# Patient Record
Sex: Female | Born: 1950
Health system: Southern US, Community
[De-identification: ages and names within clinical notes are randomized; demographics above are authoritative.]

## PROBLEM LIST (undated history)

## (undated) DIAGNOSIS — D594 Other nonautoimmune hemolytic anemias: Secondary | ICD-10-CM

## (undated) DIAGNOSIS — M4800 Spinal stenosis, site unspecified: Secondary | ICD-10-CM

## (undated) DIAGNOSIS — E039 Hypothyroidism, unspecified: Secondary | ICD-10-CM

## (undated) DIAGNOSIS — I10 Essential (primary) hypertension: Secondary | ICD-10-CM

## (undated) DIAGNOSIS — K219 Gastro-esophageal reflux disease without esophagitis: Secondary | ICD-10-CM

## (undated) DIAGNOSIS — C50919 Malignant neoplasm of unspecified site of unspecified female breast: Secondary | ICD-10-CM

## (undated) DIAGNOSIS — N938 Other specified abnormal uterine and vaginal bleeding: Secondary | ICD-10-CM

## (undated) DIAGNOSIS — Z923 Personal history of irradiation: Secondary | ICD-10-CM

## (undated) DIAGNOSIS — R0989 Other specified symptoms and signs involving the circulatory and respiratory systems: Secondary | ICD-10-CM

## (undated) DIAGNOSIS — Z9221 Personal history of antineoplastic chemotherapy: Secondary | ICD-10-CM

## (undated) DIAGNOSIS — E785 Hyperlipidemia, unspecified: Secondary | ICD-10-CM

## (undated) DIAGNOSIS — C4491 Basal cell carcinoma of skin, unspecified: Secondary | ICD-10-CM

## (undated) HISTORY — DX: Basal cell carcinoma of skin, unspecified: C44.91

## (undated) HISTORY — PX: BREAST LUMPECTOMY: SHX2

## (undated) HISTORY — PX: BREAST BIOPSY: SHX20

## (undated) HISTORY — PX: OTHER SURGICAL HISTORY: SHX169

## (undated) HISTORY — PX: FOOT SURGERY: SHX648

## (undated) HISTORY — DX: Other specified abnormal uterine and vaginal bleeding: N93.8

## (undated) HISTORY — DX: Essential (primary) hypertension: I10

## (undated) HISTORY — PX: SHOULDER ARTHROSCOPY: SHX128

## (undated) HISTORY — DX: Hypothyroidism, unspecified: E03.9

## (undated) HISTORY — DX: Hyperlipidemia, unspecified: E78.5

## (undated) HISTORY — DX: Spinal stenosis, site unspecified: M48.00

## (undated) HISTORY — DX: Malignant neoplasm of unspecified site of unspecified female breast: C50.919

## (undated) HISTORY — DX: Other nonautoimmune hemolytic anemias: D59.4

## (undated) HISTORY — PX: CERVICAL LAMINECTOMY: SHX94

## (undated) HISTORY — DX: Other specified symptoms and signs involving the circulatory and respiratory systems: R09.89

## (undated) HISTORY — PX: MASTECTOMY W/ NODES PARTIAL: SUR854

---

## 1979-07-27 HISTORY — PX: TUBAL LIGATION: SHX77

## 1998-01-21 ENCOUNTER — Ambulatory Visit (HOSPITAL_BASED_OUTPATIENT_CLINIC_OR_DEPARTMENT_OTHER): Admission: RE | Admit: 1998-01-21 | Discharge: 1998-01-21 | Payer: Self-pay | Admitting: Orthopedic Surgery

## 1998-06-18 ENCOUNTER — Emergency Department (HOSPITAL_COMMUNITY): Admission: EM | Admit: 1998-06-18 | Discharge: 1998-06-18 | Payer: Self-pay | Admitting: Emergency Medicine

## 1998-07-10 ENCOUNTER — Emergency Department (HOSPITAL_COMMUNITY): Admission: EM | Admit: 1998-07-10 | Discharge: 1998-07-10 | Payer: Self-pay | Admitting: Emergency Medicine

## 1998-10-01 ENCOUNTER — Other Ambulatory Visit: Admission: RE | Admit: 1998-10-01 | Discharge: 1998-10-01 | Payer: Self-pay | Admitting: Family Medicine

## 1999-09-18 ENCOUNTER — Encounter: Payer: Self-pay | Admitting: Family Medicine

## 1999-09-18 ENCOUNTER — Encounter: Admission: RE | Admit: 1999-09-18 | Discharge: 1999-09-18 | Payer: Self-pay | Admitting: Family Medicine

## 1999-10-12 ENCOUNTER — Other Ambulatory Visit: Admission: RE | Admit: 1999-10-12 | Discharge: 1999-10-12 | Payer: Self-pay | Admitting: Family Medicine

## 1999-11-18 ENCOUNTER — Encounter: Payer: Self-pay | Admitting: Emergency Medicine

## 1999-11-18 ENCOUNTER — Emergency Department (HOSPITAL_COMMUNITY): Admission: EM | Admit: 1999-11-18 | Discharge: 1999-11-18 | Payer: Self-pay | Admitting: Emergency Medicine

## 1999-12-26 ENCOUNTER — Encounter: Payer: Self-pay | Admitting: Gastroenterology

## 1999-12-26 ENCOUNTER — Ambulatory Visit (HOSPITAL_COMMUNITY): Admission: RE | Admit: 1999-12-26 | Discharge: 1999-12-26 | Payer: Self-pay | Admitting: Gastroenterology

## 2000-01-05 ENCOUNTER — Ambulatory Visit (HOSPITAL_COMMUNITY): Admission: RE | Admit: 2000-01-05 | Discharge: 2000-01-05 | Payer: Self-pay | Admitting: Gastroenterology

## 2000-01-05 ENCOUNTER — Encounter: Payer: Self-pay | Admitting: Gastroenterology

## 2000-01-20 ENCOUNTER — Other Ambulatory Visit: Admission: RE | Admit: 2000-01-20 | Discharge: 2000-01-20 | Payer: Self-pay | Admitting: Obstetrics and Gynecology

## 2000-03-01 ENCOUNTER — Other Ambulatory Visit: Admission: RE | Admit: 2000-03-01 | Discharge: 2000-03-01 | Payer: Self-pay | Admitting: Obstetrics and Gynecology

## 2000-03-01 ENCOUNTER — Encounter (INDEPENDENT_AMBULATORY_CARE_PROVIDER_SITE_OTHER): Payer: Self-pay | Admitting: Specialist

## 2000-03-21 ENCOUNTER — Encounter: Payer: Self-pay | Admitting: Gastroenterology

## 2000-03-21 ENCOUNTER — Ambulatory Visit (HOSPITAL_COMMUNITY): Admission: RE | Admit: 2000-03-21 | Discharge: 2000-03-21 | Payer: Self-pay | Admitting: Gastroenterology

## 2000-08-07 ENCOUNTER — Emergency Department (HOSPITAL_COMMUNITY): Admission: EM | Admit: 2000-08-07 | Discharge: 2000-08-07 | Payer: Self-pay | Admitting: Emergency Medicine

## 2000-09-19 ENCOUNTER — Encounter: Payer: Self-pay | Admitting: Family Medicine

## 2000-09-19 ENCOUNTER — Encounter: Admission: RE | Admit: 2000-09-19 | Discharge: 2000-09-19 | Payer: Self-pay | Admitting: Family Medicine

## 2001-02-09 ENCOUNTER — Emergency Department (HOSPITAL_COMMUNITY): Admission: EM | Admit: 2001-02-09 | Discharge: 2001-02-09 | Payer: Self-pay | Admitting: Emergency Medicine

## 2001-02-09 ENCOUNTER — Encounter: Payer: Self-pay | Admitting: Emergency Medicine

## 2001-04-17 ENCOUNTER — Other Ambulatory Visit: Admission: RE | Admit: 2001-04-17 | Discharge: 2001-04-17 | Payer: Self-pay | Admitting: Obstetrics and Gynecology

## 2001-09-21 ENCOUNTER — Encounter: Payer: Self-pay | Admitting: Obstetrics and Gynecology

## 2001-09-21 ENCOUNTER — Encounter: Admission: RE | Admit: 2001-09-21 | Discharge: 2001-09-21 | Payer: Self-pay | Admitting: Obstetrics and Gynecology

## 2001-09-27 ENCOUNTER — Encounter: Admission: RE | Admit: 2001-09-27 | Discharge: 2001-09-27 | Payer: Self-pay | Admitting: Obstetrics and Gynecology

## 2001-09-27 ENCOUNTER — Encounter: Payer: Self-pay | Admitting: Obstetrics and Gynecology

## 2001-10-09 ENCOUNTER — Encounter: Payer: Self-pay | Admitting: General Surgery

## 2001-10-09 ENCOUNTER — Encounter: Admission: RE | Admit: 2001-10-09 | Discharge: 2001-10-09 | Payer: Self-pay | Admitting: General Surgery

## 2001-10-12 ENCOUNTER — Encounter (INDEPENDENT_AMBULATORY_CARE_PROVIDER_SITE_OTHER): Payer: Self-pay | Admitting: Specialist

## 2001-10-12 ENCOUNTER — Ambulatory Visit (HOSPITAL_BASED_OUTPATIENT_CLINIC_OR_DEPARTMENT_OTHER): Admission: RE | Admit: 2001-10-12 | Discharge: 2001-10-12 | Payer: Self-pay | Admitting: General Surgery

## 2001-10-12 ENCOUNTER — Encounter: Admission: RE | Admit: 2001-10-12 | Discharge: 2001-10-12 | Payer: Self-pay | Admitting: General Surgery

## 2001-10-12 ENCOUNTER — Encounter: Payer: Self-pay | Admitting: General Surgery

## 2001-10-26 ENCOUNTER — Ambulatory Visit: Admission: RE | Admit: 2001-10-26 | Discharge: 2002-01-24 | Payer: Self-pay | Admitting: Radiation Oncology

## 2001-11-07 ENCOUNTER — Encounter: Payer: Self-pay | Admitting: Oncology

## 2001-11-07 ENCOUNTER — Ambulatory Visit (HOSPITAL_COMMUNITY): Admission: RE | Admit: 2001-11-07 | Discharge: 2001-11-07 | Payer: Self-pay | Admitting: Oncology

## 2002-02-22 ENCOUNTER — Ambulatory Visit: Admission: RE | Admit: 2002-02-22 | Discharge: 2002-04-30 | Payer: Self-pay | Admitting: Radiation Oncology

## 2002-02-28 ENCOUNTER — Encounter: Admission: RE | Admit: 2002-02-28 | Discharge: 2002-02-28 | Payer: Self-pay | Admitting: General Surgery

## 2002-02-28 ENCOUNTER — Encounter: Payer: Self-pay | Admitting: General Surgery

## 2002-04-25 ENCOUNTER — Other Ambulatory Visit: Admission: RE | Admit: 2002-04-25 | Discharge: 2002-04-25 | Payer: Self-pay | Admitting: Obstetrics and Gynecology

## 2002-07-02 ENCOUNTER — Encounter: Payer: Self-pay | Admitting: Oncology

## 2002-07-02 ENCOUNTER — Ambulatory Visit (HOSPITAL_COMMUNITY): Admission: RE | Admit: 2002-07-02 | Discharge: 2002-07-02 | Payer: Self-pay | Admitting: Oncology

## 2002-07-05 ENCOUNTER — Encounter: Payer: Self-pay | Admitting: Emergency Medicine

## 2002-07-05 ENCOUNTER — Emergency Department (HOSPITAL_COMMUNITY): Admission: EM | Admit: 2002-07-05 | Discharge: 2002-07-05 | Payer: Self-pay | Admitting: Emergency Medicine

## 2002-08-10 ENCOUNTER — Encounter: Payer: Self-pay | Admitting: Oncology

## 2002-08-10 ENCOUNTER — Ambulatory Visit (HOSPITAL_COMMUNITY): Admission: RE | Admit: 2002-08-10 | Discharge: 2002-08-10 | Payer: Self-pay | Admitting: Oncology

## 2002-08-28 ENCOUNTER — Encounter: Admission: RE | Admit: 2002-08-28 | Discharge: 2002-08-28 | Payer: Self-pay | Admitting: Gastroenterology

## 2002-08-28 ENCOUNTER — Encounter: Payer: Self-pay | Admitting: Gastroenterology

## 2002-09-14 ENCOUNTER — Ambulatory Visit (HOSPITAL_COMMUNITY): Admission: RE | Admit: 2002-09-14 | Discharge: 2002-09-14 | Payer: Self-pay | Admitting: Gastroenterology

## 2002-09-24 ENCOUNTER — Encounter: Payer: Self-pay | Admitting: Obstetrics and Gynecology

## 2002-09-24 ENCOUNTER — Encounter: Admission: RE | Admit: 2002-09-24 | Discharge: 2002-09-24 | Payer: Self-pay | Admitting: Obstetrics and Gynecology

## 2003-02-14 ENCOUNTER — Encounter: Payer: Self-pay | Admitting: Oncology

## 2003-02-14 ENCOUNTER — Encounter: Admission: RE | Admit: 2003-02-14 | Discharge: 2003-02-14 | Payer: Self-pay | Admitting: Oncology

## 2003-05-17 ENCOUNTER — Other Ambulatory Visit: Admission: RE | Admit: 2003-05-17 | Discharge: 2003-05-17 | Payer: Self-pay | Admitting: Obstetrics and Gynecology

## 2003-09-25 ENCOUNTER — Encounter: Admission: RE | Admit: 2003-09-25 | Discharge: 2003-09-25 | Payer: Self-pay | Admitting: Obstetrics and Gynecology

## 2004-05-28 ENCOUNTER — Other Ambulatory Visit: Admission: RE | Admit: 2004-05-28 | Discharge: 2004-05-28 | Payer: Self-pay | Admitting: Obstetrics and Gynecology

## 2004-06-25 ENCOUNTER — Ambulatory Visit: Payer: Self-pay | Admitting: Oncology

## 2004-08-24 ENCOUNTER — Ambulatory Visit: Payer: Self-pay | Admitting: Family Medicine

## 2004-09-01 ENCOUNTER — Ambulatory Visit: Payer: Self-pay | Admitting: Family Medicine

## 2004-09-25 ENCOUNTER — Encounter: Admission: RE | Admit: 2004-09-25 | Discharge: 2004-09-25 | Payer: Self-pay | Admitting: Oncology

## 2004-11-10 ENCOUNTER — Ambulatory Visit: Payer: Self-pay | Admitting: Family Medicine

## 2004-11-13 ENCOUNTER — Ambulatory Visit: Payer: Self-pay | Admitting: Family Medicine

## 2005-01-05 ENCOUNTER — Ambulatory Visit: Payer: Self-pay | Admitting: Oncology

## 2005-02-15 ENCOUNTER — Encounter: Admission: RE | Admit: 2005-02-15 | Discharge: 2005-02-15 | Payer: Self-pay | Admitting: Oncology

## 2005-06-22 ENCOUNTER — Ambulatory Visit: Payer: Self-pay | Admitting: Oncology

## 2005-07-05 ENCOUNTER — Other Ambulatory Visit: Admission: RE | Admit: 2005-07-05 | Discharge: 2005-07-05 | Payer: Self-pay | Admitting: Obstetrics and Gynecology

## 2005-07-26 DIAGNOSIS — C4491 Basal cell carcinoma of skin, unspecified: Secondary | ICD-10-CM

## 2005-07-26 HISTORY — DX: Basal cell carcinoma of skin, unspecified: C44.91

## 2005-09-06 ENCOUNTER — Ambulatory Visit: Payer: Self-pay | Admitting: Family Medicine

## 2005-09-27 ENCOUNTER — Encounter: Admission: RE | Admit: 2005-09-27 | Discharge: 2005-09-27 | Payer: Self-pay | Admitting: Oncology

## 2005-11-02 ENCOUNTER — Ambulatory Visit: Payer: Self-pay | Admitting: Family Medicine

## 2005-11-28 ENCOUNTER — Encounter: Admission: RE | Admit: 2005-11-28 | Discharge: 2005-11-28 | Payer: Self-pay | Admitting: General Surgery

## 2005-11-30 ENCOUNTER — Encounter: Admission: RE | Admit: 2005-11-30 | Discharge: 2005-11-30 | Payer: Self-pay | Admitting: Oncology

## 2005-12-09 ENCOUNTER — Encounter: Admission: RE | Admit: 2005-12-09 | Discharge: 2005-12-09 | Payer: Self-pay | Admitting: Oncology

## 2005-12-09 ENCOUNTER — Encounter (INDEPENDENT_AMBULATORY_CARE_PROVIDER_SITE_OTHER): Payer: Self-pay | Admitting: *Deleted

## 2005-12-17 ENCOUNTER — Ambulatory Visit: Payer: Self-pay | Admitting: Oncology

## 2005-12-22 LAB — COMPREHENSIVE METABOLIC PANEL
ALT: 13 U/L (ref 0–40)
Albumin: 4.4 g/dL (ref 3.5–5.2)
CO2: 28 mEq/L (ref 19–32)
Calcium: 9.5 mg/dL (ref 8.4–10.5)
Chloride: 104 mEq/L (ref 96–112)
Glucose, Bld: 94 mg/dL (ref 70–99)
Sodium: 142 mEq/L (ref 135–145)
Total Protein: 7.5 g/dL (ref 6.0–8.3)

## 2005-12-22 LAB — CBC WITH DIFFERENTIAL/PLATELET
Basophils Absolute: 0 10*3/uL (ref 0.0–0.1)
Eosinophils Absolute: 0.2 10*3/uL (ref 0.0–0.5)
HGB: 12.6 g/dL (ref 11.6–15.9)
MCV: 92 fL (ref 81.0–101.0)
MONO#: 0.3 10*3/uL (ref 0.1–0.9)
MONO%: 5.7 % (ref 0.0–13.0)
NEUT#: 2.9 10*3/uL (ref 1.5–6.5)
Platelets: 284 10*3/uL (ref 145–400)
RBC: 4.07 10*6/uL (ref 3.70–5.32)
RDW: 13.4 % (ref 11.3–14.5)
WBC: 4.9 10*3/uL (ref 3.9–10.0)

## 2005-12-22 LAB — CANCER ANTIGEN 27.29: CA 27.29: 27 U/mL (ref 0–39)

## 2005-12-22 LAB — LACTATE DEHYDROGENASE: LDH: 117 U/L (ref 94–250)

## 2006-06-01 ENCOUNTER — Encounter: Admission: RE | Admit: 2006-06-01 | Discharge: 2006-06-01 | Payer: Self-pay | Admitting: Oncology

## 2006-06-15 ENCOUNTER — Encounter: Admission: RE | Admit: 2006-06-15 | Discharge: 2006-06-15 | Payer: Self-pay | Admitting: Oncology

## 2006-06-20 ENCOUNTER — Ambulatory Visit: Payer: Self-pay | Admitting: Oncology

## 2006-06-22 LAB — CBC WITH DIFFERENTIAL/PLATELET
Basophils Absolute: 0 10*3/uL (ref 0.0–0.1)
Eosinophils Absolute: 0.2 10*3/uL (ref 0.0–0.5)
HGB: 12.8 g/dL (ref 11.6–15.9)
MCV: 90.3 fL (ref 81.0–101.0)
MONO#: 0.3 10*3/uL (ref 0.1–0.9)
NEUT#: 2.3 10*3/uL (ref 1.5–6.5)
RDW: 13.8 % (ref 11.3–14.5)
lymph#: 1.6 10*3/uL (ref 0.9–3.3)

## 2006-06-22 LAB — COMPREHENSIVE METABOLIC PANEL
Albumin: 4.2 g/dL (ref 3.5–5.2)
CO2: 27 mEq/L (ref 19–32)
Chloride: 107 mEq/L (ref 96–112)
Glucose, Bld: 107 mg/dL — ABNORMAL HIGH (ref 70–99)
Potassium: 3.8 mEq/L (ref 3.5–5.3)
Sodium: 145 mEq/L (ref 135–145)
Total Protein: 7.1 g/dL (ref 6.0–8.3)

## 2006-06-22 LAB — LACTATE DEHYDROGENASE: LDH: 115 U/L (ref 94–250)

## 2006-07-20 ENCOUNTER — Encounter: Admission: RE | Admit: 2006-07-20 | Discharge: 2006-07-20 | Payer: Self-pay | Admitting: General Surgery

## 2006-07-20 ENCOUNTER — Ambulatory Visit (HOSPITAL_BASED_OUTPATIENT_CLINIC_OR_DEPARTMENT_OTHER): Admission: RE | Admit: 2006-07-20 | Discharge: 2006-07-20 | Payer: Self-pay | Admitting: General Surgery

## 2006-07-20 ENCOUNTER — Encounter (INDEPENDENT_AMBULATORY_CARE_PROVIDER_SITE_OTHER): Payer: Self-pay | Admitting: Specialist

## 2006-07-26 ENCOUNTER — Encounter: Payer: Self-pay | Admitting: Family Medicine

## 2006-07-26 LAB — HM MAMMOGRAPHY

## 2006-09-02 ENCOUNTER — Ambulatory Visit: Payer: Self-pay | Admitting: Family Medicine

## 2006-09-02 LAB — CONVERTED CEMR LAB
Basophils Absolute: 0 10*3/uL (ref 0.0–0.1)
Basophils Relative: 0.5 % (ref 0.0–1.0)
Calcium: 9.8 mg/dL (ref 8.4–10.5)
Chloride: 106 meq/L (ref 96–112)
Cholesterol: 205 mg/dL (ref 0–200)
Creatinine, Ser: 0.4 mg/dL (ref 0.4–1.2)
Direct LDL: 140.4 mg/dL
Eosinophils Absolute: 0.2 10*3/uL (ref 0.0–0.6)
GFR calc non Af Amer: 176 mL/min
Glucose, Bld: 96 mg/dL (ref 70–99)
HCT: 38.5 % (ref 36.0–46.0)
HDL: 54.3 mg/dL (ref >39.0)
Hemoglobin: 13.1 g/dL (ref 12.0–15.0)
MCHC: 34.1 g/dL (ref 30.0–36.0)
MCV: 88.8 fL (ref 78.0–100.0)
Monocytes Relative: 7.7 % (ref 3.0–11.0)
Neutrophils Relative %: 54.4 % (ref 43.0–77.0)
RBC: 4.33 M/uL (ref 3.87–5.11)
RDW: 12.9 % (ref 11.5–14.6)
Sodium: 148 meq/L — ABNORMAL HIGH (ref 135–145)
WBC: 3.8 10*3/uL — ABNORMAL LOW (ref 4.5–10.5)

## 2006-09-09 ENCOUNTER — Ambulatory Visit: Payer: Self-pay | Admitting: Family Medicine

## 2006-09-29 ENCOUNTER — Encounter: Admission: RE | Admit: 2006-09-29 | Discharge: 2006-09-29 | Payer: Self-pay | Admitting: Obstetrics and Gynecology

## 2007-01-09 ENCOUNTER — Ambulatory Visit: Payer: Self-pay | Admitting: Family Medicine

## 2007-01-09 LAB — CONVERTED CEMR LAB: TSH: 0.29 u[IU]/mL — ABNORMAL LOW

## 2007-01-26 ENCOUNTER — Encounter: Payer: Self-pay | Admitting: Family Medicine

## 2007-01-26 DIAGNOSIS — E039 Hypothyroidism, unspecified: Secondary | ICD-10-CM

## 2007-02-16 ENCOUNTER — Encounter: Admission: RE | Admit: 2007-02-16 | Discharge: 2007-02-16 | Payer: Self-pay | Admitting: Oncology

## 2007-02-27 ENCOUNTER — Ambulatory Visit: Payer: Self-pay | Admitting: Oncology

## 2007-03-01 LAB — CBC WITH DIFFERENTIAL/PLATELET
BASO%: 0.4 % (ref 0.0–2.0)
EOS%: 3.3 % (ref 0.0–7.0)
MCH: 31.5 pg (ref 26.0–34.0)
MCHC: 35.4 g/dL (ref 32.0–36.0)
MONO#: 0.4 10*3/uL (ref 0.1–0.9)
NEUT%: 61 % (ref 39.6–76.8)
RBC: 4.44 10*6/uL (ref 3.70–5.32)
RDW: 13.6 % (ref 11.3–14.5)
WBC: 6.7 10*3/uL (ref 3.9–10.0)
lymph#: 1.9 10*3/uL (ref 0.9–3.3)

## 2007-03-01 LAB — COMPREHENSIVE METABOLIC PANEL
Alkaline Phosphatase: 98 U/L (ref 39–117)
CO2: 26 mEq/L (ref 19–32)
Creatinine, Ser: 0.62 mg/dL (ref 0.40–1.20)
Glucose, Bld: 124 mg/dL — ABNORMAL HIGH (ref 70–99)
Sodium: 144 mEq/L (ref 135–145)
Total Bilirubin: 0.4 mg/dL (ref 0.3–1.2)
Total Protein: 7.2 g/dL (ref 6.0–8.3)

## 2007-03-01 LAB — LACTATE DEHYDROGENASE: LDH: 133 U/L (ref 94–250)

## 2007-03-01 LAB — CANCER ANTIGEN 27.29: CA 27.29: 26 U/mL (ref 0–39)

## 2007-04-04 DIAGNOSIS — M549 Dorsalgia, unspecified: Secondary | ICD-10-CM | POA: Insufficient documentation

## 2007-04-10 ENCOUNTER — Ambulatory Visit: Payer: Self-pay | Admitting: Family Medicine

## 2007-04-11 ENCOUNTER — Encounter: Payer: Self-pay | Admitting: Family Medicine

## 2007-04-13 ENCOUNTER — Ambulatory Visit: Payer: Self-pay | Admitting: Family Medicine

## 2007-04-18 ENCOUNTER — Telehealth: Payer: Self-pay | Admitting: Family Medicine

## 2007-04-18 ENCOUNTER — Ambulatory Visit (HOSPITAL_COMMUNITY): Admission: RE | Admit: 2007-04-18 | Discharge: 2007-04-18 | Payer: Self-pay | Admitting: Family Medicine

## 2007-08-10 ENCOUNTER — Ambulatory Visit: Payer: Self-pay | Admitting: Family Medicine

## 2007-08-10 DIAGNOSIS — M6580 Other synovitis and tenosynovitis, unspecified site: Secondary | ICD-10-CM

## 2007-08-30 ENCOUNTER — Ambulatory Visit: Payer: Self-pay | Admitting: Oncology

## 2007-09-04 ENCOUNTER — Ambulatory Visit: Payer: Self-pay | Admitting: Family Medicine

## 2007-09-04 LAB — CONVERTED CEMR LAB
ALT: 16 units/L (ref 0–35)
BUN: 10 mg/dL (ref 6–23)
Basophils Absolute: 0 10*3/uL (ref 0.0–0.1)
Basophils Relative: 0.3 % (ref 0.0–1.0)
Bilirubin, Direct: 0.1 mg/dL (ref 0.0–0.3)
Blood in Urine, dipstick: NEGATIVE
Cholesterol: 184 mg/dL (ref 0–200)
Creatinine, Ser: 0.7 mg/dL (ref 0.4–1.2)
Eosinophils Absolute: 0.2 10*3/uL (ref 0.0–0.6)
Eosinophils Relative: 2.7 % (ref 0.0–5.0)
GFR calc non Af Amer: 92 mL/min
Glucose, Bld: 102 mg/dL — ABNORMAL HIGH (ref 70–99)
HDL: 51.1 mg/dL (ref >39.0)
Hemoglobin: 13.3 g/dL (ref 12.0–15.0)
Lymphocytes Relative: 21 % (ref 12.0–46.0)
MCV: 91.9 fL (ref 78.0–100.0)
Monocytes Absolute: 0.3 10*3/uL (ref 0.2–0.7)
Neutro Abs: 4.7 10*3/uL (ref 1.4–7.7)
Neutrophils Relative %: 71.7 % (ref 43.0–77.0)
Protein, U semiquant: NEGATIVE
RBC: 4.31 M/uL (ref 3.87–5.11)
Specific Gravity, Urine: 1.02
Total Bilirubin: 0.5 mg/dL (ref 0.3–1.2)
Triglycerides: 60 mg/dL (ref 0–149)
WBC Urine, dipstick: NEGATIVE
pH: 7.5

## 2007-09-05 DIAGNOSIS — R1031 Right lower quadrant pain: Secondary | ICD-10-CM

## 2007-09-11 ENCOUNTER — Encounter: Payer: Self-pay | Admitting: Internal Medicine

## 2007-09-11 ENCOUNTER — Ambulatory Visit: Payer: Self-pay | Admitting: Family Medicine

## 2007-09-11 DIAGNOSIS — K210 Gastro-esophageal reflux disease with esophagitis: Secondary | ICD-10-CM

## 2007-09-11 DIAGNOSIS — E785 Hyperlipidemia, unspecified: Secondary | ICD-10-CM | POA: Insufficient documentation

## 2007-09-11 DIAGNOSIS — G43909 Migraine, unspecified, not intractable, without status migrainosus: Secondary | ICD-10-CM | POA: Insufficient documentation

## 2007-09-14 ENCOUNTER — Telehealth: Payer: Self-pay | Admitting: Family Medicine

## 2007-09-19 ENCOUNTER — Encounter: Admission: RE | Admit: 2007-09-19 | Discharge: 2007-09-19 | Payer: Self-pay | Admitting: Family Medicine

## 2007-09-20 ENCOUNTER — Telehealth: Payer: Self-pay | Admitting: Family Medicine

## 2007-10-02 ENCOUNTER — Encounter: Admission: RE | Admit: 2007-10-02 | Discharge: 2007-10-02 | Payer: Self-pay | Admitting: Oncology

## 2007-10-23 ENCOUNTER — Ambulatory Visit: Payer: Self-pay | Admitting: Oncology

## 2007-10-25 LAB — CBC WITH DIFFERENTIAL/PLATELET
BASO%: 0.2 % (ref 0.0–2.0)
EOS%: 4.3 % (ref 0.0–7.0)
MCH: 31 pg (ref 26.0–34.0)
MCV: 89.9 fL (ref 81.0–101.0)
MONO%: 2.7 % (ref 0.0–13.0)
RBC: 4.35 10*6/uL (ref 3.70–5.32)
RDW: 13.7 % (ref 11.3–14.5)

## 2007-10-26 LAB — VITAMIN D 25 HYDROXY (VIT D DEFICIENCY, FRACTURES): Vit D, 25-Hydroxy: 24 ng/mL — ABNORMAL LOW (ref 30–89)

## 2007-10-26 LAB — COMPREHENSIVE METABOLIC PANEL
AST: 14 U/L (ref 0–37)
Albumin: 4.9 g/dL (ref 3.5–5.2)
Alkaline Phosphatase: 76 U/L (ref 39–117)
Potassium: 3.7 mEq/L (ref 3.5–5.3)
Sodium: 140 mEq/L (ref 135–145)
Total Protein: 7.9 g/dL (ref 6.0–8.3)

## 2007-10-31 LAB — VITAMIN D 1,25 DIHYDROXY: Vit D, 1,25-Dihydroxy: 50 pg/mL (ref 15–75)

## 2008-07-12 ENCOUNTER — Ambulatory Visit: Payer: Self-pay | Admitting: Family Medicine

## 2008-07-12 DIAGNOSIS — N390 Urinary tract infection, site not specified: Secondary | ICD-10-CM | POA: Insufficient documentation

## 2008-07-12 LAB — CONVERTED CEMR LAB
Glucose, Urine, Semiquant: NEGATIVE
Ketones, urine, test strip: NEGATIVE
Protein, U semiquant: NEGATIVE
Specific Gravity, Urine: >=1.03
Urobilinogen, UA: 0.2
pH: 7.5

## 2008-08-26 DIAGNOSIS — R0989 Other specified symptoms and signs involving the circulatory and respiratory systems: Secondary | ICD-10-CM

## 2008-08-26 HISTORY — DX: Other specified symptoms and signs involving the circulatory and respiratory systems: R09.89

## 2008-09-03 ENCOUNTER — Ambulatory Visit: Payer: Self-pay | Admitting: Family Medicine

## 2008-09-03 LAB — CONVERTED CEMR LAB
ALT: 19 units/L (ref 0–35)
AST: 17 units/L (ref 0–37)
Albumin: 4.3 g/dL (ref 3.5–5.2)
Alkaline Phosphatase: 46 units/L (ref 39–117)
Basophils Relative: 0.2 % (ref 0.0–3.0)
CO2: 31 meq/L (ref 19–32)
Calcium: 9.6 mg/dL (ref 8.4–10.5)
Chloride: 104 meq/L (ref 96–112)
Cholesterol: 186 mg/dL (ref 0–200)
Creatinine, Ser: 0.5 mg/dL (ref 0.4–1.2)
Eosinophils Relative: 0.5 % (ref 0.0–5.0)
GFR calc Af Amer: 164 mL/min
GFR calc non Af Amer: 135 mL/min
HDL: 71.2 mg/dL (ref >39.0)
Ketones, urine, test strip: NEGATIVE
Lymphocytes Relative: 32 % (ref 12.0–46.0)
Monocytes Absolute: 0.4 10*3/uL (ref 0.1–1.0)
Neutro Abs: 4.4 10*3/uL (ref 1.4–7.7)
Nitrite: NEGATIVE
Platelets: 255 10*3/uL (ref 150–400)
Potassium: 4.1 meq/L (ref 3.5–5.1)
RDW: 13 % (ref 11.5–14.6)
Specific Gravity, Urine: 1.02
TSH: 0.96 microintl units/mL (ref 0.35–5.50)
Urobilinogen, UA: 0.2
VLDL: 12 mg/dL (ref 0–40)
WBC Urine, dipstick: NEGATIVE

## 2008-09-10 ENCOUNTER — Ambulatory Visit: Payer: Self-pay | Admitting: Family Medicine

## 2008-09-10 DIAGNOSIS — R0989 Other specified symptoms and signs involving the circulatory and respiratory systems: Secondary | ICD-10-CM

## 2008-09-16 ENCOUNTER — Encounter: Payer: Self-pay | Admitting: Family Medicine

## 2008-09-16 ENCOUNTER — Ambulatory Visit: Payer: Self-pay

## 2008-09-24 ENCOUNTER — Ambulatory Visit: Payer: Self-pay | Admitting: Family Medicine

## 2008-10-02 ENCOUNTER — Encounter: Admission: RE | Admit: 2008-10-02 | Discharge: 2008-10-02 | Payer: Self-pay | Admitting: Oncology

## 2008-10-06 ENCOUNTER — Encounter: Admission: RE | Admit: 2008-10-06 | Discharge: 2008-10-06 | Payer: Self-pay | Admitting: Oncology

## 2008-11-14 ENCOUNTER — Ambulatory Visit: Payer: Self-pay | Admitting: Oncology

## 2008-11-18 LAB — COMPREHENSIVE METABOLIC PANEL
ALT: 21 U/L (ref 0–35)
AST: 20 U/L (ref 0–37)
CO2: 31 mEq/L (ref 19–32)
Creatinine, Ser: 0.52 mg/dL (ref 0.40–1.20)
Total Bilirubin: 0.6 mg/dL (ref 0.3–1.2)

## 2008-11-18 LAB — LACTATE DEHYDROGENASE: LDH: 120 U/L (ref 94–250)

## 2008-11-18 LAB — CBC WITH DIFFERENTIAL/PLATELET
BASO%: 0.3 % (ref 0.0–2.0)
EOS%: 2.6 % (ref 0.0–7.0)
HCT: 38.7 % (ref 34.8–46.6)
LYMPH%: 25.9 % (ref 14.0–49.7)
MCH: 31.5 pg (ref 25.1–34.0)
MCHC: 34.2 g/dL (ref 31.5–36.0)
MCV: 92.2 fL (ref 79.5–101.0)
NEUT%: 67.2 % (ref 38.4–76.8)
Platelets: 253 10*3/uL (ref 145–400)

## 2009-02-17 ENCOUNTER — Encounter: Admission: RE | Admit: 2009-02-17 | Discharge: 2009-02-17 | Payer: Self-pay | Admitting: Oncology

## 2009-04-01 ENCOUNTER — Ambulatory Visit: Payer: Self-pay | Admitting: Family Medicine

## 2009-04-01 ENCOUNTER — Encounter: Payer: Self-pay | Admitting: *Deleted

## 2009-04-01 DIAGNOSIS — J039 Acute tonsillitis, unspecified: Secondary | ICD-10-CM

## 2009-04-09 ENCOUNTER — Telehealth (INDEPENDENT_AMBULATORY_CARE_PROVIDER_SITE_OTHER): Payer: Self-pay | Admitting: *Deleted

## 2009-04-26 ENCOUNTER — Ambulatory Visit: Payer: Self-pay | Admitting: Internal Medicine

## 2009-04-26 DIAGNOSIS — N3000 Acute cystitis without hematuria: Secondary | ICD-10-CM

## 2009-04-26 LAB — CONVERTED CEMR LAB
Bilirubin Urine: NEGATIVE
Nitrite: NEGATIVE
Protein, U semiquant: NEGATIVE
Urobilinogen, UA: 0.2

## 2009-09-05 ENCOUNTER — Ambulatory Visit: Payer: Self-pay | Admitting: Family Medicine

## 2009-09-05 LAB — CONVERTED CEMR LAB
Albumin: 4 g/dL (ref 3.5–5.2)
BUN: 16 mg/dL (ref 6–23)
Basophils Absolute: 0 10*3/uL (ref 0.0–0.1)
CO2: 29 meq/L (ref 19–32)
Calcium: 9.2 mg/dL (ref 8.4–10.5)
Cholesterol: 177 mg/dL (ref 0–200)
Eosinophils Absolute: 0.1 10*3/uL (ref 0.0–0.7)
Glucose, Bld: 105 mg/dL — ABNORMAL HIGH (ref 70–99)
HCT: 39.3 % (ref 36.0–46.0)
HDL: 65.8 mg/dL (ref >39.00)
Hemoglobin: 13 g/dL (ref 12.0–15.0)
Lymphs Abs: 1.1 10*3/uL (ref 0.7–4.0)
MCHC: 33.1 g/dL (ref 30.0–36.0)
Neutro Abs: 6.1 10*3/uL (ref 1.4–7.7)
Platelets: 243 10*3/uL (ref 150.0–400.0)
Potassium: 4.4 meq/L (ref 3.5–5.1)
RDW: 12.8 % (ref 11.5–14.6)
Sodium: 144 meq/L (ref 135–145)
TSH: 0.51 microintl units/mL (ref 0.35–5.50)
Total Bilirubin: 0.5 mg/dL (ref 0.3–1.2)
Triglycerides: 41 mg/dL (ref 0.0–149.0)
VLDL: 8.2 mg/dL (ref 0.0–40.0)

## 2009-09-15 ENCOUNTER — Ambulatory Visit: Payer: Self-pay | Admitting: Family Medicine

## 2009-10-03 ENCOUNTER — Encounter: Admission: RE | Admit: 2009-10-03 | Discharge: 2009-10-03 | Payer: Self-pay | Admitting: Obstetrics and Gynecology

## 2009-12-26 ENCOUNTER — Emergency Department (HOSPITAL_COMMUNITY): Admission: EM | Admit: 2009-12-26 | Discharge: 2009-12-26 | Payer: Self-pay | Admitting: Emergency Medicine

## 2010-04-07 ENCOUNTER — Ambulatory Visit: Payer: Self-pay | Admitting: Family Medicine

## 2010-04-07 ENCOUNTER — Telehealth: Payer: Self-pay | Admitting: Family Medicine

## 2010-04-07 DIAGNOSIS — L259 Unspecified contact dermatitis, unspecified cause: Secondary | ICD-10-CM

## 2010-07-21 ENCOUNTER — Ambulatory Visit
Admission: RE | Admit: 2010-07-21 | Discharge: 2010-07-21 | Payer: Self-pay | Source: Home / Self Care | Attending: Internal Medicine | Admitting: Internal Medicine

## 2010-07-21 DIAGNOSIS — I10 Essential (primary) hypertension: Secondary | ICD-10-CM

## 2010-08-25 NOTE — Progress Notes (Signed)
Summary: Request to be Worked In  Phone Note Call from Patient Call back at Home Phone [phone] Call back at [phone]   Caller: Husband -Fayrene Fearing Summary of Call: Pt has itching rash around neck can only come in after 4pm today due to work schedule.  Wants to know if pt could be worked in.  Please advise Initial call taken by: Trixie Dredge,  April 07, 2010 9:13 AM  Follow-up for Phone Call        ok Follow-up by: Roderick Pee MD,  April 07, 2010 9:47 AM  Additional Follow-up for Phone Call Additional follow up Details #1::        Phone Call Completed Additional Follow-up by: Kern Reap CMA Duncan Dull),  April 07, 2010 9:55 AM

## 2010-08-25 NOTE — Assessment & Plan Note (Signed)
Summary: CPX NO PAP//SLM/RSC BMP/JLP   Vital Signs:  Patient profile:   60 year old female Height:      61.25 inches Weight:      136 pounds Temp:     99.4 degrees F oral BP sitting:   128 / 78  (left arm) Cuff size:   regular  Vitals Entered By: Kern Reap CMA Duncan Dull) (September 15, 2009 2:13 PM)  Reason for Visit cpx  History of Present Illness: Patricia Solis is a 60 year old, married female, nonsmoker, who comes in today for evaluation.  She has a history of hypothyroidism and takes Synthroid 100 micrograms daily TSH level is normal.  Continued dose.  She also takes over-the-counter Prilosec 20 mg daily for reflux.  She also takes simvastatin 40 mg nightly for hyperlipidemia.  Lipids are ago continue but does  She gets routine eye care.  Dental care does BSE monthly.......Marland Kitchen breast cancer, right breast 2003........ in a mammography colonoscopy 2003, normal, tetanus, 2003, seasonal flu 2010.  She went to see Dr. Shon Baton at [facility] orthopedics for evaluation of back pain.  She was told she had spinal stenosis.  She's tried anti-inflammatories, physical therapy, but she still has pain.  She was advised the next step was either injections or surgery.  I will get her set up to see Dr. Danielle Dess, the neurosurgeon  Allergies: 1)  ! Pcn 2)  ! Erythromycin 3)  ! * Clindamyacin  Past History:  Past medical, surgical, family and social histories (including risk factors) reviewed, and no changes noted (except as noted below).  Past Medical History: Reviewed history from 09/24/2008 and no changes required. Hypothyroidism MHA DUB ESOPHAGITIS BREAST CANCER-R spinal stenosis right carotid bruit.  Normal Doppler February 2010  Past Surgical History: Reviewed history from 09/10/2008 and no changes required. BTL CHEMOTHRAPY&RADIATION   -INVASIVE LOBULAR CARCINOMA/ R BREAST PARTIAL MASECTOMY& R NODE BIOPSY Cervical laminectomy  L  Family History: Reviewed history from 09/11/2007 and  no changes required. father died in his 46s, MI, coronary disease, diabetes mother died in her 42s, coronary disease two brothers in good health twin sister died of MS  Social History: Reviewed history from 09/11/2007 and no changes required. Occupation: Married Never Smoked Alcohol use-no Drug use-no Regular exercise-yes  Review of Systems      See HPI  Physical Exam  General:  Well-developed,well-nourished,in no acute distress; alert,appropriate and cooperative throughout examination Head:  Normocephalic and atraumatic without obvious abnormalities. No apparent alopecia or balding. Eyes:  No corneal or conjunctival inflammation noted. EOMI. Perrla. Funduscopic exam benign, without hemorrhages, exudates or papilledema. Vision grossly normal. Ears:  External ear exam shows no significant lesions or deformities.  Otoscopic examination reveals clear canals, tympanic membranes are intact bilaterally without bulging, retraction, inflammation or discharge. Hearing is grossly normal bilaterally. Nose:  External nasal examination shows no deformity or inflammation. Nasal mucosa are pink and moist without lesions or exudates. Mouth:  Oral mucosa and oropharynx without lesions or exudates.  Teeth in good repair. Neck:  No deformities, masses, or tenderness noted. Chest Wall:  No deformities, masses, or tenderness noted. Breasts:  left breast normal scar tissue at the 9 o'clock position right breast from previous surgery Lungs:  Normal respiratory effort, chest expands symmetrically. Lungs are clear to auscultation, no crackles or wheezes. Heart:  Normal rate and regular rhythm. S1 and S2 normal without gallop, murmur, click, rub or other extra sounds. Abdomen:  Bowel sounds positive,abdomen soft and non-tender without masses, organomegaly or hernias noted. Msk:  No  deformity or scoliosis noted of thoracic or lumbar spine.   Pulses:  R and L carotid,radial,femoral,dorsalis pedis and posterior  tibial pulses are full and equal bilaterally Extremities:  No clubbing, cyanosis, edema, or deformity noted with normal full range of motion of all joints.   Neurologic:  No cranial nerve deficits noted. Station and gait are normal. Plantar reflexes are down-going bilaterally. DTRs are symmetrical throughout. Sensory, motor and coordinative functions appear intact. Skin:  Intact without suspicious lesions or rashes Cervical Nodes:  No lymphadenopathy noted Axillary Nodes:  No palpable lymphadenopathy Inguinal Nodes:  No significant adenopathy Psych:  Cognition and judgment appear intact. Alert and cooperative with normal attention span and concentration. No apparent delusions, illusions, hallucinations   Impression & Recommendations:  Problem # 1:  CAROTID BRUIT, RIGHT (ICD-785.9) Assessment Unchanged  Orders: EKG w/ Interpretation (93000)  Problem # 2:  ESOPHAGITIS, REFLUX (ICD-530.11) Assessment: Improved  Her updated medication list for this problem includes:    Prilosec Otc 20 Mg Tbec (Omeprazole magnesium) ..... One by mouth daily  Problem # 3:  HYPERLIPIDEMIA (ICD-272.4) Assessment: Improved  Her updated medication list for this problem includes:    Simvastatin 40 Mg Tabs (Simvastatin) .Marland Kitchen... 1 tab @ bedtime  Orders: EKG w/ Interpretation (93000)  Problem # 4:  BACKACHE NOS (ICD-724.5) Assessment: Deteriorated  The following medications were removed from the medication list:    Hydrocodone-acetaminophen 10-325 Mg Tabs (Hydrocodone-acetaminophen) .Marland Kitchen... Take one tab once daily    Naprelan 375 Mg Xr24h-tab (Naproxen sodium) .Marland Kitchen... Take one tab once daily Her updated medication list for this problem includes:    Adult Aspirin Ec Low Strength 81 Mg Tbec (Aspirin)    Vicodin Es 7.5-750 Mg Tabs (Hydrocodone-acetaminophen) .Marland Kitchen... Take 1 tablet by mouth three times a day as needed pain  Orders: Neurosurgeon Referral (Neurosurgeon)  Complete Medication List: 1)  Synthroid 100  Mcg  .... Take 1 tablet by mouth every morning 2)  Prilosec Otc 20 Mg Tbec (Omeprazole magnesium) .... One by mouth daily 3)  Adult Aspirin Ec Low Strength 81 Mg Tbec (Aspirin) 4)  Caltrate 600 1500 Mg Tabs (Calcium carbonate) .... Once daily 5)  Simvastatin 40 Mg Tabs (Simvastatin) .Marland Kitchen.. 1 tab @ bedtime 6)  Vicodin Es 7.5-750 Mg Tabs (Hydrocodone-acetaminophen) .... Take 1 tablet by mouth three times a day as needed pain  Patient Instructions: 1)  call. the vascular group to see when you are due for follow-up 2)  we will set up a consult for you to see Barnett Abu neurosurgeon for evaluation of your back. 3)  Please schedule a follow-up appointment in 1 year. 4)  Schedule your mammogram. 5)  Take an Aspirin every day. Prescriptions: SIMVASTATIN 40 MG TABS (SIMVASTATIN) 1 tab @ bedtime  #100 x 3   Entered and Authorized by:   Roderick Pee MD   Signed by:   Roderick Pee MD on 09/15/2009   Method used:   Print then Give to Patient   RxID:   [id number] SYNTHROID 100 MCG Take 1 tablet by mouth every morning  #100 x 3   Entered and Authorized by:   Roderick Pee MD   Signed by:   Roderick Pee MD on 09/15/2009   Method used:   Print then Give to Patient   RxID:   [id number] VICODIN ES 7.5-750 MG TABS (HYDROCODONE-ACETAMINOPHEN) Take 1 tablet by mouth three times a day as needed pain  #30 x 1   Entered and Authorized by:  Roderick Pee MD   Signed by:   Roderick Pee MD on 09/15/2009   Method used:   Print then Give to Patient   RxID:   [id number]    Immunization History:  Influenza Immunization History:    Influenza:  historical (04/25/2009)

## 2010-08-25 NOTE — Assessment & Plan Note (Signed)
Summary: rash - rv   Vital Signs:  Patient profile:   60 year old female Height:      61.25 inches Weight:      142 pounds BMI:     26.71 Temp:     98.4 degrees F oral BP sitting:   130 / 88  (left arm) Cuff size:   regular  Vitals Entered By: Kern Reap CMA Duncan Dull) (April 07, 2010 5:05 PM) CC: rash on neck   CC:  rash on neck.  History of Present Illness: Patricia Solis is a 60 year old female, who comes in today for evaluation of a rash on her neck.  She states it started a couple weeks ago.  She thinks is most likely related to garment that she has to wear at work.  Allergies: 1)  ! Pcn 2)  ! Erythromycin 3)  ! * Clindamyacin  Past History:  Past medical, surgical, family and social histories (including risk factors) reviewed, and no changes noted (except as noted below).  Past Medical History: Reviewed history from 09/24/2008 and no changes required. Hypothyroidism MHA DUB ESOPHAGITIS BREAST CANCER-R spinal stenosis right carotid bruit.  Normal Doppler February 2010  Past Surgical History: Reviewed history from 09/10/2008 and no changes required. BTL CHEMOTHRAPY&RADIATION   -INVASIVE LOBULAR CARCINOMA/ R BREAST PARTIAL MASECTOMY& R NODE BIOPSY Cervical laminectomy  L  Family History: Reviewed history from 09/11/2007 and no changes required. father died in his 41s, MI, coronary disease, diabetes mother died in her 51s, coronary disease two brothers in good health twin sister died of MS  Social History: Reviewed history from 09/11/2007 and no changes required. Occupation: Married Never Smoked Alcohol use-no Drug use-no Regular exercise-yes  Review of Systems      See HPI  Physical Exam  General:  Well-developed,well-nourished,in no acute distress; alert,appropriate and cooperative throughout examination Skin:  the skin is slightly raised and erythematous.   Problems:  Medical Problems Added: 1)  Dx of Contact Dermatitis&other Eczema Due  Unspec Cause  (ICD-692.9)  Impression & Recommendations:  Problem # 1:  CONTACT DERMATITIS&OTHER ECZEMA DUE UNSPEC CAUSE (ICD-692.9) Assessment New  Her updated medication list for this problem includes:    Triamcinolone Acetonide 0.5 % Oint (Triamcinolone acetonide) .Marland Kitchen... Apply two times a day  Orders: Prescription Created Electronically [phone])  Complete Medication List: 1)  Synthroid 100 Mcg  .... Take 1 tablet by mouth every morning 2)  Prilosec Otc 20 Mg Tbec (Omeprazole magnesium) .... One by mouth daily 3)  Adult Aspirin Ec Low Strength 81 Mg Tbec (Aspirin) 4)  Caltrate 600 1500 Mg Tabs (Calcium carbonate) .... Once daily 5)  Simvastatin 40 Mg Tabs (Simvastatin) .Marland Kitchen.. 1 tab @ bedtime 6)  Vicodin Es 7.5-750 Mg Tabs (Hydrocodone-acetaminophen) .... Take 1 tablet by mouth three times a day as needed pain 7)  Triamcinolone Acetonide 0.5 % Oint (Triamcinolone acetonide) .... Apply two times a day  Patient Instructions: 1)  apply small amounts of the steroid gel twice daily until the rash goes away.  Wear a protective garment around her neck at work Prescriptions: TRIAMCINOLONE ACETONIDE 0.5 % OINT (TRIAMCINOLONE ACETONIDE) apply two times a day  #60 gr x 1   Entered and Authorized by:   Roderick Pee MD   Signed by:   Roderick Pee MD on 04/07/2010   Method used:   Electronically to        [facility]* (retail)       [address]  [address] [place], [place]  [address]       Ph: [phone] or [phone]       Fax: [phone]   RxID:   [id number]

## 2010-08-27 NOTE — Assessment & Plan Note (Signed)
Summary: bp elevated/njr   Vital Signs:  Patient profile:   60 year old female Weight:      137 pounds Temp:     98.5 degrees F oral BP sitting:   138 / 80  (left arm) Cuff size:   regular  Vitals Entered By: Duard Brady LPN (July 21, 2010 10:57 AM) CC: elvated BP concerns - 150's / 80-90's Is Patient Diabetic? No   CC:  elvated BP concerns - 150's / 80-90's.  History of Present Illness: 60 -year-old patient who is seen today with blood pressure concerns.  For the past two or 3 weeks.  She has been tracking her blood pressure closely at her health club with consistently high readings.  She has then accompanied by her husband, who is on blood pressure medication and has had a consistently controlled readings.  Her blood pressures also been checked at work with high readings.  She does have a family history of hypertension.  Other than her anxiety about the blood pressure, she has done well  Allergies: 1)  ! Pcn 2)  ! Erythromycin 3)  ! * Clindamyacin  Past History:  Past Medical History: Hypothyroidism MHA DUB ESOPHAGITIS BREAST CANCER-R spinal stenosis right carotid bruit.  Normal Doppler February 2010 Hypertension- treatment started December 2011  Physical Exam  General:  Well-developed,well-nourished,in no acute distress; alert,appropriate and cooperative throughout examination; blood  pressure as high as 180/100   Impression & Recommendations:  Problem # 1:  HYPERTENSION NEC (ICD-997.91)  Complete Medication List: 1)  Synthroid 100 Mcg  .... Take 1 tablet by mouth every morning 2)  Prilosec Otc 20 Mg Tbec (Omeprazole magnesium) .... One by mouth daily 3)  Adult Aspirin Ec Low Strength 81 Mg Tbec (Aspirin) 4)  Caltrate 600 1500 Mg Tabs (Calcium carbonate) .... Once daily 5)  Simvastatin 40 Mg Tabs (Simvastatin) .Marland Kitchen.. 1 tab @ bedtime 6)  Triamcinolone Acetonide 0.5 % Oint (Triamcinolone acetonide) .... Apply two times a day 7)   Lisinopril-hydrochlorothiazide 20-25 Mg Tabs (Lisinopril-hydrochlorothiazide) .... One daily  Patient Instructions: 1)  return for your annual exam next month 2)  Limit your Sodium (Salt) to less than 2 grams a day(slightly less than 1/2 a teaspoon) to prevent fluid retention, swelling, or worsening of symptoms. 3)  It is important that you exercise regularly at least 20 minutes 5 times a week. If you develop chest pain, have severe difficulty breathing, or feel very tired , stop exercising immediately and seek medical attention. 4)  You need to lose weight. Consider a lower calorie diet and regular exercise.  5)  Check your Blood Pressure regularly. Prescriptions: LISINOPRIL-HYDROCHLOROTHIAZIDE 20-25 MG TABS (LISINOPRIL-HYDROCHLOROTHIAZIDE) one daily  #90 x 0   Entered and Authorized by:   Gordy Savers  MD   Signed by:   Gordy Savers  MD on 07/21/2010   Method used:   Electronically to        [facility]* (retail)       [address] [place], [place]  [address]       Ph: [phone] or [phone]       Fax: [phone]   RxID:   [id number]    Orders Added: 1)  Est. Patient Level III [27253]

## 2010-09-10 ENCOUNTER — Other Ambulatory Visit (INDEPENDENT_AMBULATORY_CARE_PROVIDER_SITE_OTHER): Payer: BC Managed Care – HMO | Admitting: Family Medicine

## 2010-09-10 DIAGNOSIS — Z0000 Encounter for general adult medical examination without abnormal findings: Secondary | ICD-10-CM

## 2010-09-10 LAB — BASIC METABOLIC PANEL
BUN: 16 mg/dL (ref 6–23)
CO2: 30 mEq/L (ref 19–32)
Calcium: 9.4 mg/dL (ref 8.4–10.5)
Creatinine, Ser: 0.6 mg/dL (ref 0.4–1.2)
Glucose, Bld: 83 mg/dL (ref 70–99)

## 2010-09-10 LAB — POCT URINALYSIS DIPSTICK
Glucose, UA: NEGATIVE
Nitrite, UA: NEGATIVE
Spec Grav, UA: 1.02
Urobilinogen, UA: 0.2

## 2010-09-10 LAB — HEPATIC FUNCTION PANEL
Albumin: 4.2 g/dL (ref 3.5–5.2)
Alkaline Phosphatase: 54 U/L (ref 39–117)
Total Bilirubin: 0.5 mg/dL (ref 0.3–1.2)

## 2010-09-10 LAB — CBC WITH DIFFERENTIAL/PLATELET
Basophils Absolute: 0 10*3/uL (ref 0.0–0.1)
Eosinophils Absolute: 0.2 10*3/uL (ref 0.0–0.7)
Hemoglobin: 13.2 g/dL (ref 12.0–15.0)
Lymphocytes Relative: 29.6 % (ref 12.0–46.0)
MCHC: 33.6 g/dL (ref 30.0–36.0)
Neutro Abs: 2.7 10*3/uL (ref 1.4–7.7)
Platelets: 260 10*3/uL (ref 150.0–400.0)
RDW: 14.2 % (ref 11.5–14.6)

## 2010-09-10 LAB — LIPID PANEL
Cholesterol: 187 mg/dL (ref 0–200)
HDL: 65.1 mg/dL (ref >39.00)
VLDL: 13.4 mg/dL (ref 0.0–40.0)

## 2010-09-17 ENCOUNTER — Ambulatory Visit (INDEPENDENT_AMBULATORY_CARE_PROVIDER_SITE_OTHER): Payer: BC Managed Care – HMO | Admitting: Family Medicine

## 2010-09-17 ENCOUNTER — Encounter: Payer: Self-pay | Admitting: Family Medicine

## 2010-09-17 ENCOUNTER — Other Ambulatory Visit: Payer: Self-pay | Admitting: Obstetrics and Gynecology

## 2010-09-17 DIAGNOSIS — Z1231 Encounter for screening mammogram for malignant neoplasm of breast: Secondary | ICD-10-CM

## 2010-09-17 DIAGNOSIS — E039 Hypothyroidism, unspecified: Secondary | ICD-10-CM

## 2010-09-17 DIAGNOSIS — L259 Unspecified contact dermatitis, unspecified cause: Secondary | ICD-10-CM

## 2010-09-17 DIAGNOSIS — I10 Essential (primary) hypertension: Secondary | ICD-10-CM

## 2010-09-17 DIAGNOSIS — E785 Hyperlipidemia, unspecified: Secondary | ICD-10-CM

## 2010-09-17 DIAGNOSIS — R0989 Other specified symptoms and signs involving the circulatory and respiratory systems: Secondary | ICD-10-CM

## 2010-09-17 DIAGNOSIS — G43909 Migraine, unspecified, not intractable, without status migrainosus: Secondary | ICD-10-CM

## 2010-09-17 DIAGNOSIS — K2100 Gastro-esophageal reflux disease with esophagitis, without bleeding: Secondary | ICD-10-CM

## 2010-09-17 MED ORDER — LISINOPRIL-HYDROCHLOROTHIAZIDE 20-25 MG PO TABS: 1.0000 | ORAL_TABLET | Freq: Every day | ORAL | Status: DC

## 2010-09-17 MED ORDER — SIMVASTATIN 40 MG PO TABS: 40.0000 mg | ORAL_TABLET | Freq: Every day | ORAL | Status: DC

## 2010-09-17 MED ORDER — LEVOTHYROXINE SODIUM 75 MCG PO TABS: 75.0000 ug | ORAL_TABLET | Freq: Every day | ORAL | Status: DC

## 2010-09-17 MED ORDER — OMEPRAZOLE MAGNESIUM 20 MG PO TBEC: 20.0000 mg | DELAYED_RELEASE_TABLET | Freq: Every day | ORAL | Status: DC

## 2010-09-17 NOTE — Patient Instructions (Signed)
Continue your current medications except decrease the Synthroid to 75 mcg daily.  Follow-up in one year

## 2010-09-17 NOTE — Progress Notes (Signed)
  Subjective:    Patient ID: Patricia Solis, female    DOB: Mar 19, 1951, 61 y.o.   MRN: [id number]  HPIDonnie Is a 59 year old, married female, nonsmoker, who comes in today for annual exam.  Because of the numerous issues.  She has underlying hypothyroidism, for which she takes Synthroid 100 mcg daily for TSH level .3 to therefore, will decrease dose to 75 mcg daily.  She also has underlying hypertension, for which he takes Zestoretic 20 to 25 daily.  BP 120/78.  She takes Prilosec 20 mg daily for reflux esophagitis.  She also takes Zocor 40 mg daily for hyperlipidemia.  Lipids are at goal.  She has a skin rash of unknown etiology.  She is currently seeing the dermatologist.  She gets routine eye care, and dental care, BSE monthly, annual mammography, vaccinations up to date.  Pelvic by GI, Y.    Review of Systems  Constitutional: Negative.   HENT: Negative.   Eyes: Negative.   Respiratory: Negative.   Cardiovascular: Negative.   Gastrointestinal: Negative.   Genitourinary: Negative.   Musculoskeletal: Negative.   Neurological: Negative.   Hematological: Negative.   Psychiatric/Behavioral: Negative.        Objective:   Physical Exam  Constitutional: She appears well-developed and well-nourished.  HENT:  Head: Normocephalic and atraumatic.  Right Ear: External ear normal.  Left Ear: External ear normal.  Nose: Nose normal.  Mouth/Throat: Oropharynx is clear and moist.  Eyes: EOM are normal. Pupils are equal, round, and reactive to light.  Neck: Normal range of motion. Neck supple. No thyromegaly present.  Cardiovascular: Normal rate, regular rhythm, normal heart sounds and intact distal pulses.  Exam reveals no gallop and no friction rub.   No murmur heard. Pulmonary/Chest: Effort normal and breath sounds normal.  Abdominal: Soft. Bowel sounds are normal. She exhibits no distension and no mass. There is no tenderness. There is no rebound.  Musculoskeletal: Normal range of  motion.  Lymphadenopathy:    She has no cervical adenopathy.  Neurological: She is alert. She has normal reflexes. No cranial nerve deficit. She exhibits normal muscle tone. Coordination normal.  Skin: Skin is warm and dry.  Psychiatric: She has a normal mood and affect. Her behavior is normal. Judgment and thought content normal.          Assessment & Plan:  Hypothyroidism plan decrease Synthroid to 75 mcg daily.  Hypertension.  Continue Zestoretic.  Reflux esophagitis.  Continue Prilosec 20 daily.  Hyperlipidemia.  Continue Zocor 40 mg daily.  Skin rash, unknown etiology.  Follow with dermatology

## 2010-10-06 ENCOUNTER — Ambulatory Visit
Admission: RE | Admit: 2010-10-06 | Discharge: 2010-10-06 | Disposition: A | Discharge status: Home / Self Care | Payer: BC Managed Care – HMO | Source: Ambulatory Visit | Attending: Obstetrics and Gynecology | Admitting: Obstetrics and Gynecology

## 2010-10-06 DIAGNOSIS — Z1231 Encounter for screening mammogram for malignant neoplasm of breast: Secondary | ICD-10-CM

## 2010-12-11 NOTE — Op Note (Signed)
   NAME:  Patricia Solis, Patricia Solis                           ACCOUNT NO.:  [id number]   MEDICAL RECORD NO.:  [id number]                   PATIENT TYPE:  AMB   LOCATION:  ENDO                                 FACILITY:  [facility]   PHYSICIAN:  James L. Malon Kindle., M.D.          DATE OF BIRTH:  [birth year]   DATE OF PROCEDURE:  09/14/2002  DATE OF DISCHARGE:                                 OPERATIVE REPORT   PROCEDURE PERFORMED:  Colonoscopy   MEDICATIONS:  Fentanyl 90 mcg, Versed 6 mg IV   INDICATIONS FOR PROCEDURE:  Colon evaluation in a women with persistent  abdominal pain.   DESCRIPTION OF PROCEDURE:  The procedure had been explained to the patient  and consent obtained.  With the patient in the left lateral decubitus  position the Olympus pediatric adjustable video colonoscope was inserted.  This was advanced under direct visualization.  The prep was excellent.  The  patient had a long tortous colon.  Multiple maneuvers were required to pass  and eventually we were able to pass all the cecum.  Ileocecal valve and  appendiceal orifice were seen. The scope was withdrawn.  The cecum,  ascending colon, hepatic flexure, transverse colon, splenic flexure,  descending and sigmoid colon were seen well and were normal.  No polyps were  seen.  No diverticulosis.  The only abnormal finding was a very long  tortuous colon requiring multiple position changes and maneuvers to reach  the cecum.  The rectum was free of polyps or other lesions.   ASSESSMENT:  Essentially normal colonoscopy.  Nothing to explain her pain. I  was wonder if she might have chronic bloating consistent with irritable  bowel.   PLAN:  Will see back in the office in six to eight weeks.  Will go ahead and  give her a Zelnorm trial to see if that helps.                                               James L. Malon Kindle., M.D.    Waldron Session  D:  09/14/2002  T:  09/14/2002  Job:  [id number]   cc:   Tinnie Gens A. Tawanna Cooler, M.D. [facility]

## 2010-12-11 NOTE — Op Note (Signed)
[place]  Patient:    Patricia Solis, Patricia Solis Visit Number: [id number] MRN: [id number]          Service Type: DSU Location: [facility] Attending Physician:  Brandy Hale Dictated by:   Angelia Mould. Derrell Lolling, M.D. Proc. Date: 10/12/01 Admit Date:  10/12/2001   CC:         Marcelle Overlie, M.D.  Dr. Phillips Climes   Operative Report  PREOPERATIVE DIAGNOSIS:  Carcinoma of the right breast.  POSTOPERATIVE DIAGNOSIS:  Carcinoma of the right breast.  OPERATION PERFORMED: 1. Injection of blue dye. 2. Right partial mastectomy. 3. Right axillary sentinel lymph node mapping and biopsy.  SURGEON:  Angelia Mould. Derrell Lolling, M.D.  ASSISTANT:  Quinn Plowman, PA student.  ANESTHESIA:  INDICATIONS FOR PROCEDURE:  The patient is a 60 year old white female who had a screening mammogram recently and that showed an abnormality in the lateral aspect of the right breast.  This was a small subtle stellate density. Ultrasound guided core needle biopsy of the right breast upper outer quadrant on September 27, 2001 showed atypical proliferation, highly suspicious for invasive carcinoma.  I have examined the patient and there is a focal area of thickening up there.  I discussed the pathology report with her and her husband.  I told them that we were highly suspicious that this was breast cancer although the pathologist could not be diagnostic about this.  We talked about going ahead with a breast biopsy alone or just proceeding with a wide local excision and a sentinel lymph node mapping and biopsy.  We talked about the pros and cons of both approaches and they very much wanted to have a single anesthetic and considering that this is most likely cancer, they were willing to undergo the axillary lymph node biopsy regardless of the final pathology.  DESCRIPTION OF PROCEDURE:  The patient underwent needle localization at the [facility] this morning.  She then came to  the [facility] and had radionuclide injected.  She was then taken to the operating room.  General endotracheal anesthesia was induced.  We injected the breast tissue laterally in the subareolar area with Lymphazurin blue dye 5 cc. The breast was then massaged for four or five minutes.  The right breast and axilla were then prepped and draped in sterile fashion.  The Neoprobe was brought to the operative field and we were easily able to map out a focal area of increased activity in the axilla that was at least 10 to 15 times the background.  We made a short transverse incision just below the hairline over this area and dissected down through the subcutaneous tissue. We incised the clavipectoral fascia entering the axilla.  We dissected out lymph nodes.  The first lymph node was intensely blue in color and had activity that was approximately 100 times background.  The second lymph node was about 15 times background but was not blue.  We removed both of these lymph nodes.  Imprint cytology by Dr. Debby Bud was negative for cancer cells. We then identified the localizing wire that was placed at about 9 oclock position in the right breast.  We made a transverse elliptical incision encompassing the wire.  Dissection was carried down deep into the breast tissue following the tract of the wire and going widely around this area.  The specimen was removed.  The margins were marked with silk sutures.  The specimen was sent for specimen mammography and Dr.  Cardenosa called back and said that we had completely removed the abnormal area.  The wound was irrigated with saline.  Hemostasis was excellent and achieved with electrocautery.  Both incisions were closed with running subcuticular sutures of 4-0 Vicryl and Steri-Strips.  Clean bandages were placed and the patient was taken to recovery room in stable condition.  Estimated blood loss was about 40 to 50 cc.  Complications were none.   Sponge, needle and instrument counts were correct. Dictated by:   Angelia Mould. Derrell Lolling, M.D. Attending Physician:  Brandy Hale DD:  10/12/01 TD:  10/13/01 Job: [id number]

## 2010-12-11 NOTE — Op Note (Signed)
Patricia Solis, Patricia Solis                 ACCOUNT NO.:  [id number]   MEDICAL RECORD NO.:  [id number]          PATIENT TYPE:  AMB   LOCATION:  DSC                          FACILITY:  [facility]   PHYSICIAN:  Angelia Mould. Derrell Lolling, M.D.DATE OF BIRTH:  [birth year]   DATE OF PROCEDURE:  07/20/2006  DATE OF DISCHARGE:                               OPERATIVE REPORT   PREOPERATIVE DIAGNOSIS:  Abnormal MRI of left breast.   POSTOPERATIVE DIAGNOSIS:  Abnormal MRI of left breast.   OPERATION PERFORMED:  Excision left breast tissue with needle  localization and specimen mammogram.   SURGEON:  Angelia Mould. Derrell Lolling, M.D.   OPERATIVE INDICATIONS:  This is a 60 year old woman who had a right  partial mastectomy and sentinel node biopsy on 12/12/2001 for invasive  lobular carcinoma of the right breast.  This was a stage T1C, N0, ER PR  positive tumor.  She had adjuvant chemotherapy, adjuvant radiation  therapy, and is currently taking Arimidex.  The breast MR's were  performed in April2007 and repeated in November2007.  She had a  small micro lobulated mass in the medial aspect the left breast in the 9  o'clock position which was biopsied in April and showed a sclerosing  papilloma and adenosis.  This was followed.  Follow-up MRI on 06/01/2006  showed the left breast mass to persist and had more intense enhancement  and from a radiologist standpoint this was a highly suspicious for  malignancy.  This mass cannot be imaged by ultrasound or mammography.  Excision of this area was advised.  She was counseled regarding this  procedure as an outpatient and is in full agreement.   OPERATIVE TECHNIQUE:  The patient underwent needle localization at the  breast center of [facility] this morning and the localization was very  adequate.  The patient was given intravenous antibiotics prior to  incision.  She is brought to operating room.  A general LMA anesthetic  was induced.  The left breast was prepped and draped in a  sterile  fashion.  0.5% Marcaine with epinephrine was used as a local  infiltration anesthetic.  The localizing wire was inserted medially at  about the 9 o'clock position and directed laterally.  A linear  transverse incision was made starting at the insertion site of the wire  and extending medially to just medial to the areolar margin.  Dissection  was carried down into the breast tissue and around localizing wire using  electrocautery.  The specimen was marked with silk sutures to orient the  pathologist to the superficial and lateral margins.  Specimen mammogram  was performed and the specimen mammogram showed that the area in  question including the clip was completely excised.  The specimen was  then sent for routine histology.  Hemostasis was excellent and achieved  with electrocautery.  The wound was irrigated with saline.  The deep  breast tissue was closed with interrupted sutures of 3-0 Vicryl.  Skin  closed with running subcuticular suture of 4-0 Monocryl and Steri-  Strips.  Clean bandages were placed and the patient taken to the  recovery room in stable condition.  Estimated blood loss was about 10 mL  or less.  Complications none.  Sponge, needle and instrument counts were  correct.      Angelia Mould. Derrell Lolling, M.D.  Electronically Signed     HMI/MEDQ  D:  07/20/2006  T:  07/20/2006  Job:  [id number]   cc:   Tinnie Gens A. Tawanna Cooler, MD  Pierce Crane, M.D.

## 2010-12-29 ENCOUNTER — Ambulatory Visit (INDEPENDENT_AMBULATORY_CARE_PROVIDER_SITE_OTHER): Payer: BC Managed Care – HMO | Admitting: Family Medicine

## 2010-12-29 ENCOUNTER — Encounter: Payer: Self-pay | Admitting: Family Medicine

## 2010-12-29 DIAGNOSIS — Z01419 Encounter for gynecological examination (general) (routine) without abnormal findings: Secondary | ICD-10-CM

## 2010-12-29 DIAGNOSIS — E039 Hypothyroidism, unspecified: Secondary | ICD-10-CM

## 2010-12-29 DIAGNOSIS — I499 Cardiac arrhythmia, unspecified: Secondary | ICD-10-CM | POA: Insufficient documentation

## 2010-12-29 NOTE — Patient Instructions (Signed)
Take two tablets daily, until I call you

## 2010-12-29 NOTE — Progress Notes (Signed)
  Subjective:    Patient ID: Patricia Solis, female    DOB: 13-Feb-1951, 60 y.o.   MRN: [id number]  HPIDonnie is a 60 year old female, who comes in today because she feels that her thyroid dose is inadequate.  We change her dose back in the winter because she was TSH was too low.  We dropped the dose to 75 mcg daily, and she did well for about 3 weeks ago, when she started having symptoms again.    Review of Systems    General and neurologic review of systems otherwise negative Objective:   Physical Exam    Well-developed well-nourished, female, in no acute distress.  Examination of the thyroid gland shows asymmetry with enlargement of the right lobe    Assessment & Plan:  Hypothyroidism,,,,,,,,,,, check TSH.  Follow-up

## 2010-12-31 ENCOUNTER — Telehealth: Payer: Self-pay | Admitting: *Deleted

## 2010-12-31 LAB — TSH: TSH: 9.54 u[IU]/mL — ABNORMAL HIGH (ref 0.35–5.50)

## 2010-12-31 NOTE — Telephone Encounter (Signed)
Pt is asking for results of lab results, please.

## 2010-12-31 NOTE — Telephone Encounter (Signed)
Thyroid studies, not back.  I will ask her to call Selena Batten to get the data

## 2011-01-01 ENCOUNTER — Telehealth: Payer: Self-pay | Admitting: Family Medicine

## 2011-01-01 MED ORDER — LEVOTHYROXINE SODIUM 88 MCG PO TABS: 88.0000 ug | ORAL_TABLET | Freq: Every day | ORAL | Status: DC

## 2011-01-01 NOTE — Telephone Encounter (Signed)
Patient is aware of lab results, Rx sent, and patient will call back for lab appointment.

## 2011-01-01 NOTE — Telephone Encounter (Signed)
Please confirm the patient has taken Synthroid 75 mcg daily. If this is correct increase daily Synthroid  To  88 mcg daily. Suggest followup TSH in 8 weeks

## 2011-01-01 NOTE — Telephone Encounter (Signed)
Pt would like blood work results °

## 2011-01-04 NOTE — ED Provider Notes (Signed)
Order(s) created erroneously. Erroneous order ID: [id number] Order moved by: Haze Boyden Order move date/time: 01/04/2011  1:04 PM Source Patient:    [name] Source Contact: 12/29/2010 Destination Patient:    [name] Destination Contact: 12/29/2010

## 2011-01-15 NOTE — Progress Notes (Signed)
Patient is aware and lab appointment made 

## 2011-01-26 ENCOUNTER — Ambulatory Visit (INDEPENDENT_AMBULATORY_CARE_PROVIDER_SITE_OTHER): Payer: BC Managed Care – HMO | Admitting: Family Medicine

## 2011-01-26 ENCOUNTER — Encounter: Payer: Self-pay | Admitting: Family Medicine

## 2011-01-26 DIAGNOSIS — K222 Esophageal obstruction: Secondary | ICD-10-CM | POA: Insufficient documentation

## 2011-01-26 NOTE — Patient Instructions (Signed)
Hold all your medications.  Stay on a liquid diet.  Call Dr. Randa Evens to be seen ASAP

## 2011-01-26 NOTE — Progress Notes (Signed)
  Subjective:    Patient ID: Patricia Solis, female    DOB: [birth year], 60 y.o.   MRN: [id number]  HPIDonnie is a 60 year old, married female, nonsmoker, who comes in today for evaluation of acute chest pain.  She states over the past 3 to 4, weeks.  She's had intermittent episodes of nausea, but no difficulty swallowing, until last night.  Last night she ate a banana stain, which and she points to the lower esophageal area and states her food got stuck in one pass.  She was up all night with pain.  No vomiting.  Finally pass this morning.  She's never had trouble like this in the past.  Her gastroenterologist is Dr. Randa Evens.    Review of Systems    General and GI review of systems otherwise negative Objective:   Physical Exam    Well-developed well-nourished, female in no acute distress.  Cardiac exam negative.  Exam.  Negative    Assessment & Plan:  Symptoms consistent with a lower esophageal stricture.  Plan liquid diet GI consult ASAP

## 2011-01-29 ENCOUNTER — Other Ambulatory Visit: Payer: Self-pay | Admitting: Gastroenterology

## 2011-02-02 ENCOUNTER — Ambulatory Visit
Admission: RE | Admit: 2011-02-02 | Discharge: 2011-02-02 | Disposition: A | Discharge status: Home / Self Care | Payer: BC Managed Care – HMO | Source: Ambulatory Visit | Attending: Gastroenterology | Admitting: Gastroenterology

## 2011-02-15 ENCOUNTER — Other Ambulatory Visit (INDEPENDENT_AMBULATORY_CARE_PROVIDER_SITE_OTHER): Payer: BC Managed Care – HMO

## 2011-02-15 DIAGNOSIS — E039 Hypothyroidism, unspecified: Secondary | ICD-10-CM

## 2011-02-27 ENCOUNTER — Ambulatory Visit (INDEPENDENT_AMBULATORY_CARE_PROVIDER_SITE_OTHER): Payer: BC Managed Care – HMO | Admitting: Family Medicine

## 2011-02-27 ENCOUNTER — Encounter: Payer: Self-pay | Admitting: Family Medicine

## 2011-02-27 VITALS — BP 110/62 | HR 88 | Temp 98.8°F | Wt 137.8 lb

## 2011-02-27 DIAGNOSIS — R05 Cough: Secondary | ICD-10-CM | POA: Insufficient documentation

## 2011-02-27 MED ORDER — BENZONATATE 100 MG PO CAPS: 100.0000 mg | ORAL_CAPSULE | Freq: Three times a day (TID) | ORAL | Status: DC | PRN

## 2011-02-27 MED ORDER — AZITHROMYCIN 250 MG PO TABS: ORAL_TABLET | ORAL | Status: AC

## 2011-02-27 MED ORDER — HYDROCOD POLST-CHLORPHEN POLST 10-8 MG/5ML PO LQCR: 5.0000 mL | Freq: Every evening | ORAL | Status: DC | PRN

## 2011-02-27 NOTE — Progress Notes (Signed)
  Subjective:    Patient ID: Patricia Solis, female    DOB: February 22, 1951, 60 y.o.   MRN: [id number]  HPI CC: cough  1 wk h/o dry hacking cough.  Not productive.  Coughing all day, worse at night or in am.  Mild RN.  Also sore in shoulders.    No fevers/chills, ST, congestion.  No abd pain, n/v, rashes.  No posttussive emesis.  No chest soreness or SOB or wheezing.  Has had this in past, gets every few years and lasts 3-4 mo.  Has been treated well with prednisone and cough syrup in past.  No smokers at home.  No h/o allergies, asthma or COPD.  Pt around grandchildren - 7, 5yo, and 72mo.  Husband sick as well since June.  No recent Tdap.  Doesn't work in school system.  H/o reflux, but well controlled on prilosec.  Cough not worse after certain foods. No weight changes, NS.  Review of Systems Per HPI    Objective:   Physical Exam  Nursing note and vitals reviewed. Constitutional: She appears well-developed and well-nourished. No distress.       Hacking cough during exam  HENT:  Head: Normocephalic and atraumatic.  Right Ear: Hearing, tympanic membrane, external ear and ear canal normal.  Left Ear: Hearing, tympanic membrane, external ear and ear canal normal.  Nose: Nose normal. No mucosal edema or rhinorrhea. Right sinus exhibits no maxillary sinus tenderness and no frontal sinus tenderness. Left sinus exhibits no maxillary sinus tenderness and no frontal sinus tenderness.  Mouth/Throat: Uvula is midline, oropharynx is clear and moist and mucous membranes are normal. No oropharyngeal exudate, posterior oropharyngeal edema, posterior oropharyngeal erythema or tonsillar abscesses.  Eyes: Conjunctivae and EOM are normal. Pupils are equal, round, and reactive to light. No scleral icterus.  Neck: Normal range of motion. Neck supple. No JVD present. No thyromegaly present.  Cardiovascular: Normal rate, regular rhythm, normal heart sounds and intact distal pulses.   No murmur  heard. Pulmonary/Chest: Effort normal and breath sounds normal. No respiratory distress. She has no wheezes. She has no rales.  Lymphadenopathy:    She has no cervical adenopathy.  Skin: Skin is warm and dry. No rash noted.  Psychiatric: She has a normal mood and affect.          Assessment & Plan:

## 2011-02-27 NOTE — Assessment & Plan Note (Addendum)
Possible bronchitis vs bronchospasm. Treat with zpack given around children <60yo. Cough syrup and tessalon perls. If not better, consider steroid course. States has taken zpack in past ok.

## 2011-02-27 NOTE — Patient Instructions (Addendum)
I think your cough is irrigation of bronchioles, possible bronchitis. Treat with zpack, tessalon perls during day (swallow don't chew) and tussionex at night (may make you sleepy). Update [place] if not better after this.

## 2011-03-23 ENCOUNTER — Encounter: Payer: Self-pay | Admitting: Family Medicine

## 2011-03-23 ENCOUNTER — Ambulatory Visit (INDEPENDENT_AMBULATORY_CARE_PROVIDER_SITE_OTHER): Payer: BC Managed Care – HMO | Admitting: Family Medicine

## 2011-03-23 VITALS — BP 102/68 | Temp 99.0°F | Wt 133.0 lb

## 2011-03-23 DIAGNOSIS — R05 Cough: Secondary | ICD-10-CM

## 2011-03-23 MED ORDER — HYDROCODONE-HOMATROPINE 5-1.5 MG/5ML PO SYRP: 5.0000 mL | ORAL_SOLUTION | Freq: Four times a day (QID) | ORAL | Status: AC | PRN

## 2011-03-23 MED ORDER — PREDNISONE 20 MG PO TABS: ORAL_TABLET | ORAL | Status: DC

## 2011-03-23 NOTE — Progress Notes (Signed)
  Subjective:    Patient ID: Patricia Solis, female    DOB: January 15, 1951, 60 y.o.   MRN: [id number]  HPI Patricia Solis is a 60 year old, married female, nonsmoker, who comes in today with a history of coughing for 3 to 4 weeks.  She's had these episodes in the past, and she develops reactive airway disease.  We treated with low-dose short-term prednisone and her symptoms have abated.  Two weeks ago.  She went to the Saturday clinic was given across tablet and an antibiotic, which did not help.  She continues to cough.  No fever no sputum production.   Review of Systems    General pulmonary he is systems otherwise negative Objective:   Physical Exam Well-developed well-nourished, female in no acute distress.  Examination of the HEENT negative.  Neck was supple.  Thyroid not enlarged.  No adenopathy.  Lungs were clear.  No wheezing       Assessment & Plan:  Reactive airway disease.  Plan prednisone burst and taper

## 2011-03-23 NOTE — Patient Instructions (Signed)
Take the prednisone tablets, and to use feels significantly better, then taper as directed.  Hydromet 0.5-teaspoon two to 3 times daily as needed as a cough suppressant.  Drink lots of water.  Return p.r.n.

## 2011-07-29 ENCOUNTER — Other Ambulatory Visit: Payer: Self-pay | Admitting: Obstetrics and Gynecology

## 2011-07-29 DIAGNOSIS — Z1231 Encounter for screening mammogram for malignant neoplasm of breast: Secondary | ICD-10-CM

## 2011-08-09 ENCOUNTER — Other Ambulatory Visit: Payer: Self-pay | Admitting: Family Medicine

## 2011-09-03 ENCOUNTER — Ambulatory Visit (INDEPENDENT_AMBULATORY_CARE_PROVIDER_SITE_OTHER): Payer: BC Managed Care – HMO | Admitting: Family

## 2011-09-03 ENCOUNTER — Encounter: Payer: Self-pay | Admitting: Family

## 2011-09-03 VITALS — BP 98/60 | Temp 98.7°F | Wt 130.0 lb

## 2011-09-03 DIAGNOSIS — A084 Viral intestinal infection, unspecified: Secondary | ICD-10-CM

## 2011-09-03 DIAGNOSIS — A09 Infectious gastroenteritis and colitis, unspecified: Secondary | ICD-10-CM

## 2011-09-03 DIAGNOSIS — R112 Nausea with vomiting, unspecified: Secondary | ICD-10-CM

## 2011-09-03 LAB — CBC
RDW: 13.2 % (ref 11.5–14.6)
WBC: 5 10*3/uL (ref 4.5–10.5)

## 2011-09-03 LAB — BASIC METABOLIC PANEL
BUN: 20 mg/dL (ref 6–23)
Calcium: 9.3 mg/dL (ref 8.4–10.5)
Creatinine, Ser: 0.7 mg/dL (ref 0.4–1.2)
GFR: 89.09 mL/min (ref >60.00)

## 2011-09-03 MED ORDER — PROMETHAZINE HCL 25 MG PO TABS: 12.5000 mg | ORAL_TABLET | Freq: Three times a day (TID) | ORAL | Status: AC | PRN

## 2011-09-03 NOTE — Progress Notes (Signed)
Subjective:    Patient ID: Patricia Solis, female    DOB: 1951-05-19, 61 y.o.   MRN: [id number]  HPI 61 year old white female, nonsmoker, his family complaints of nausea and vomiting has been one of her 3 days. Her symptoms have improved. She has had a decrease in her appetite. Denies any abdominal pain, cramping, diarrhea or constipation. She has not taken anything over-the-counter for his symptoms. Patient also denies fever, muscle aches or pains, sneezing, coughing, congestion, blood in her stool foul-smelling stools.   Review of Systems  Constitutional: Positive for fatigue.  HENT: Negative.   Respiratory: Negative.   Cardiovascular: Negative.   Gastrointestinal: Positive for nausea, vomiting and abdominal pain. Negative for diarrhea.  Genitourinary: Negative.   Musculoskeletal: Negative.   Skin: Negative.   Neurological: Negative.   Hematological: Negative.   Psychiatric/Behavioral: Negative.    Past Medical History  Diagnosis Date  . Hypothyroidism   . MHA (microangiopathic hemolytic anemia)   . DUB (dysfunctional uterine bleeding)   . Esophagitis   . Spinal stenosis   . Carotid bruit 08/2008    Normal Doppler  . HTN (hypertension)     Tx started 06/2010  . Breast cancer     Invasive lobular carcinoma RIGHT breast    History   Social History  . Marital Status: Married    Spouse Name: N/A    Number of Children: N/A  . Years of Education: N/A   Occupational History  . Not on file.   Social History Main Topics  . Smoking status: Never Smoker   . Smokeless tobacco: Not on file  . Alcohol Use: No  . Drug Use: No  . Sexually Active: Not on file   Other Topics Concern  . Not on file   Social History Narrative   Regular exercise - YES    Past Surgical History  Procedure Date  . Btl   . Mastectomy w/ nodes partial     Right  . Cervical laminectomy     Left    Family History  Problem Relation Age of Onset  . Heart attack Father   . Coronary artery  disease Father   . Diabetes Father   . Coronary artery disease Mother   . Multiple sclerosis Sister     Twin    Allergies  Allergen Reactions  . Clindamycin/Lincomycin   . Erythromycin     REACTION: rash  . Penicillins     REACTION: rash    Current Outpatient Prescriptions on File Prior to Visit  Medication Sig Dispense Refill  . aspirin 81 MG EC tablet Take 81 mg by mouth daily.        . Calcium Carbonate (CALTRATE 600) 1500 MG TABS Take by mouth daily.        . chlorpheniramine-HYDROcodone (TUSSIONEX) 10-8 MG/5ML LQCR Take 5 mLs by mouth at bedtime as needed. Sedation precautions  140 mL  0  . levothyroxine (SYNTHROID, LEVOTHROID) 88 MCG tablet Take 1 tablet (88 mcg total) by mouth daily.  90 tablet  3  . lisinopril-hydrochlorothiazide (PRINZIDE,ZESTORETIC) 20-25 MG per tablet TAKE 1 TABLET DAILY  90 tablet  3  . omeprazole (PRILOSEC OTC) 20 MG tablet Take 1 tablet (20 mg total) by mouth daily.  100 tablet  3  . predniSONE (DELTASONE) 20 MG tablet Take two tablets daily until you feel significantly better, then taper by taking one tablet for 3 days, a half for 3 days, then half a tablet Monday, Wednesday, Friday, for a two  week taper  50 tablet  1  . simvastatin (ZOCOR) 40 MG tablet TAKE 1 TABLET AT BEDTIME  90 tablet  3  . triamcinolone (KENALOG) 0.5 % ointment Apply topically 2 (two) times daily.          BP 98/60  Temp(Src) 98.7 F (37.1 C) (Oral)  Wt 130 lb (58.968 kg)chart    Objective:   Physical Exam  Constitutional: She is oriented to person, place, and time. She appears well-developed and well-nourished.  HENT:  Right Ear: External ear normal.  Left Ear: External ear normal.  Nose: Nose normal.  Mouth/Throat: Oropharynx is clear and moist.  Neck: Normal range of motion. Neck supple.  Cardiovascular: Normal rate, regular rhythm and normal heart sounds.   Pulmonary/Chest: Effort normal and breath sounds normal.  Abdominal: Soft. Bowel sounds are normal. She  exhibits no distension and no mass. There is no tenderness. There is no rebound and no guarding.  Musculoskeletal: Normal range of motion.  Neurological: She is alert and oriented to person, place, and time.  Skin: Skin is warm and dry.  Psychiatric: She has a normal mood and affect.          Assessment & Plan:  Assessment: Viral gastroenteritis, Nausea and Vomiting  Plan: Phenergan 25 mg 1/2-1 tablet by mouth 3 times a day when necessary nausea. Labs today to include BMP and CBC notify patient of the results. Encouraged a bland diet advance as tolerated. Call the office if symptoms worsen or persist, recheck as scheduled and when necessary.

## 2011-09-03 NOTE — Patient Instructions (Signed)
Nausea and Vomiting Nausea is a sick feeling that often comes before throwing up (vomiting). Vomiting is a reflex where stomach contents come out of your mouth. Vomiting can cause severe loss of body fluids (dehydration). Children and elderly adults can become dehydrated quickly, especially if they also have diarrhea. Nausea and vomiting are symptoms of a condition or disease. It is important to find the cause of your symptoms. CAUSES   Direct irritation of the stomach lining. This irritation can result from increased acid production (gastroesophageal reflux disease), infection, food poisoning, taking certain medicines (such as nonsteroidal anti-inflammatory drugs), alcohol use, or tobacco use.   Signals from the brain.These signals could be caused by a headache, heat exposure, an inner ear disturbance, increased pressure in the brain from injury, infection, a tumor, or a concussion, pain, emotional stimulus, or metabolic problems.   An obstruction in the gastrointestinal tract (bowel obstruction).   Illnesses such as diabetes, hepatitis, gallbladder problems, appendicitis, kidney problems, cancer, sepsis, atypical symptoms of a heart attack, or eating disorders.   Medical treatments such as chemotherapy and radiation.   Receiving medicine that makes you sleep (general anesthetic) during surgery.  DIAGNOSIS Your caregiver may ask for tests to be done if the problems do not improve after a few days. Tests may also be done if symptoms are severe or if the reason for the nausea and vomiting is not clear. Tests may include:  Urine tests.   Blood tests.   Stool tests.   Cultures (to look for evidence of infection).   X-rays or other imaging studies.  Test results can help your caregiver make decisions about treatment or the need for additional tests. TREATMENT You need to stay well hydrated. Drink frequently but in small amounts.You may wish to drink water, sports drinks, clear broth, or  eat frozen ice pops or gelatin dessert to help stay hydrated.When you eat, eating slowly may help prevent nausea.There are also some antinausea medicines that may help prevent nausea. HOME CARE INSTRUCTIONS   Take all medicine as directed by your caregiver.   If you do not have an appetite, do not force yourself to eat. However, you must continue to drink fluids.   If you have an appetite, eat a normal diet unless your caregiver tells you differently.   Eat a variety of complex carbohydrates (rice, wheat, potatoes, bread), lean meats, yogurt, fruits, and vegetables.   Avoid high-fat foods because they are more difficult to digest.   Drink enough water and fluids to keep your urine clear or pale yellow.   If you are dehydrated, ask your caregiver for specific rehydration instructions. Signs of dehydration may include:   Severe thirst.   Dry lips and mouth.   Dizziness.   Dark urine.   Decreasing urine frequency and amount.   Confusion.   Rapid breathing or pulse.  SEEK IMMEDIATE MEDICAL CARE IF:   You have blood or brown flecks (like coffee grounds) in your vomit.   You have black or bloody stools.   You have a severe headache or stiff neck.   You are confused.   You have severe abdominal pain.   You have chest pain or trouble breathing.   You do not urinate at least once every 8 hours.   You develop cold or clammy skin.   You continue to vomit for longer than 24 to 48 hours.   You have a fever.  MAKE SURE YOU:   Understand these instructions.   Will watch your   condition.   Will get help right away if you are not doing well or get worse.  Document Released: 07/12/2005 Document Revised: 03/24/2011 Document Reviewed: 12/09/2010 [employer] Patient Information 2012 [employer]. 

## 2011-09-06 ENCOUNTER — Ambulatory Visit (INDEPENDENT_AMBULATORY_CARE_PROVIDER_SITE_OTHER): Payer: BC Managed Care – HMO | Admitting: Family Medicine

## 2011-09-06 ENCOUNTER — Encounter: Payer: Self-pay | Admitting: Family Medicine

## 2011-09-06 VITALS — BP 102/60 | Temp 98.8°F | Wt 132.0 lb

## 2011-09-06 DIAGNOSIS — R05 Cough: Secondary | ICD-10-CM

## 2011-09-06 MED ORDER — HYDROCODONE-HOMATROPINE 5-1.5 MG/5ML PO SYRP: 5.0000 mL | ORAL_SOLUTION | Freq: Three times a day (TID) | ORAL | Status: AC | PRN

## 2011-09-06 NOTE — Patient Instructions (Signed)
Drink lots of liquids  Tylenol as needed  Chloraseptic lozenges for sore throat  Hydromet 1/2-1 teaspoon up to 3 times daily when necessary

## 2011-09-06 NOTE — Progress Notes (Signed)
  Subjective:    Patient ID: Patricia Solis, female    DOB: [birth year], 61 y.o.   MRN: [id number]  HPI  Patricia Solis is a 61 year old married female nonsmoker who comes in today for evaluation of a sore throat  She states last week she had a viral syndrome with nausea and vomiting. She came in was seen on Friday. Exam was normal for reasons that are unclear of the mat and a CBC were drawn both of which are normal she was advised Phenergan clear liquids. Yesterday she developed a sore throat. She went to the nurse at work who told her she had a fever however her temperature was 100.2 which is not fever  She has a slight cough    Review of Systems General and pulmonary review of systems otherwise negative    Objective:   Physical Exam  Well-developed well-nourished female in no acute distress HEENT negative neck was supple no adenopathy lungs are clear      Assessment & Plan:  Viral syndrome plan treat symptomatically lots of liquids Tylenol Hydromet when necessary

## 2011-09-14 ENCOUNTER — Other Ambulatory Visit (INDEPENDENT_AMBULATORY_CARE_PROVIDER_SITE_OTHER): Payer: BC Managed Care – HMO

## 2011-09-14 DIAGNOSIS — Z0000 Encounter for general adult medical examination without abnormal findings: Secondary | ICD-10-CM

## 2011-09-14 LAB — HEPATIC FUNCTION PANEL
ALT: 17 U/L (ref 0–35)
Albumin: 4.3 g/dL (ref 3.5–5.2)
Alkaline Phosphatase: 54 U/L (ref 39–117)
Total Protein: 7.3 g/dL (ref 6.0–8.3)

## 2011-09-14 LAB — BASIC METABOLIC PANEL
CO2: 27 mEq/L (ref 19–32)
Chloride: 102 mEq/L (ref 96–112)
Sodium: 140 mEq/L (ref 135–145)

## 2011-09-14 LAB — POCT URINALYSIS DIPSTICK
Bilirubin, UA: NEGATIVE
Glucose, UA: NEGATIVE
Ketones, UA: NEGATIVE
Leukocytes, UA: NEGATIVE
Nitrite, UA: NEGATIVE
pH, UA: 7

## 2011-09-14 LAB — CBC WITH DIFFERENTIAL/PLATELET
Basophils Relative: 0.4 % (ref 0.0–3.0)
Eosinophils Absolute: 0.2 10*3/uL (ref 0.0–0.7)
Eosinophils Relative: 3.4 % (ref 0.0–5.0)
Hemoglobin: 13.3 g/dL (ref 12.0–15.0)
Lymphocytes Relative: 23.1 % (ref 12.0–46.0)
MCHC: 33.4 g/dL (ref 30.0–36.0)
MCV: 92.8 fl (ref 78.0–100.0)
Monocytes Absolute: 0.4 10*3/uL (ref 0.1–1.0)
Neutro Abs: 4.5 10*3/uL (ref 1.4–7.7)
RBC: 4.28 Mil/uL (ref 3.87–5.11)
WBC: 6.7 10*3/uL (ref 4.5–10.5)

## 2011-09-14 LAB — LIPID PANEL
HDL: 63.3 mg/dL (ref >39.00)
Total CHOL/HDL Ratio: 3

## 2011-09-14 LAB — TSH: TSH: 3.39 u[IU]/mL (ref 0.35–5.50)

## 2011-09-21 ENCOUNTER — Ambulatory Visit (INDEPENDENT_AMBULATORY_CARE_PROVIDER_SITE_OTHER): Payer: BC Managed Care – HMO | Admitting: Family Medicine

## 2011-09-21 ENCOUNTER — Encounter: Payer: Self-pay | Admitting: Family Medicine

## 2011-09-21 DIAGNOSIS — I10 Essential (primary) hypertension: Secondary | ICD-10-CM

## 2011-09-21 DIAGNOSIS — E039 Hypothyroidism, unspecified: Secondary | ICD-10-CM

## 2011-09-21 DIAGNOSIS — Z0000 Encounter for general adult medical examination without abnormal findings: Secondary | ICD-10-CM

## 2011-09-21 DIAGNOSIS — E785 Hyperlipidemia, unspecified: Secondary | ICD-10-CM

## 2011-09-21 DIAGNOSIS — G43909 Migraine, unspecified, not intractable, without status migrainosus: Secondary | ICD-10-CM

## 2011-09-21 DIAGNOSIS — Z23 Encounter for immunization: Secondary | ICD-10-CM

## 2011-09-21 MED ORDER — SIMVASTATIN 40 MG PO TABS: 40.0000 mg | ORAL_TABLET | Freq: Every day | ORAL | Status: DC

## 2011-09-21 MED ORDER — LISINOPRIL-HYDROCHLOROTHIAZIDE 20-25 MG PO TABS: 1.0000 | ORAL_TABLET | Freq: Every day | ORAL | Status: DC

## 2011-09-21 MED ORDER — LEVOTHYROXINE SODIUM 88 MCG PO TABS: 88.0000 ug | ORAL_TABLET | Freq: Every day | ORAL | Status: DC

## 2011-09-21 NOTE — Progress Notes (Signed)
  Subjective:    Patient ID: Patricia Solis, female    DOB: 1950/12/28, 61 y.o.   MRN: [id number]  HPI Reba is a 61 year old married female nonsmoker who comes in today for a general physical examination because of a history of hypothyroidism status post thyroidectomy, hypertension, reflux esophagitis, hyperlipidemia,  Her medications reviewed the been no changes. Last tetanus booster 2003 update today     Review of Systems  Constitutional: Negative.   HENT: Negative.   Eyes: Negative.   Respiratory: Negative.   Cardiovascular: Negative.   Gastrointestinal: Negative.   Genitourinary: Negative.   Musculoskeletal: Negative.   Neurological: Negative.   Hematological: Negative.   Psychiatric/Behavioral: Negative.        Objective:   Physical Exam  Constitutional: She appears well-developed and well-nourished.  HENT:  Head: Normocephalic and atraumatic.  Right Ear: External ear normal.  Left Ear: External ear normal.  Nose: Nose normal.  Mouth/Throat: Oropharynx is clear and moist.  Eyes: EOM are normal. Pupils are equal, round, and reactive to light.  Neck: Normal range of motion. Neck supple. No thyromegaly present.  Cardiovascular: Normal rate, regular rhythm, normal heart sounds and intact distal pulses.  Exam reveals no gallop and no friction rub.   No murmur heard. Pulmonary/Chest: Effort normal and breath sounds normal.  Abdominal: Soft. Bowel sounds are normal. She exhibits no distension and no mass. There is no tenderness. There is no rebound.  Genitourinary:       Bilateral breast exam normal  Musculoskeletal: Normal range of motion.  Lymphadenopathy:    She has no cervical adenopathy.  Neurological: She is alert. She has normal reflexes. No cranial nerve deficit. She exhibits normal muscle tone. Coordination normal.  Skin: Skin is warm and dry.  Psychiatric: She has a normal mood and affect. Her behavior is normal. Judgment and thought content normal.          Assessment & Plan:  Healthy female  Hypothyroidism status post thyroidectomy continue Synthroid  Hypertension continue Zestoretic 20/25 daily  Reflux esophagitis continue Prilosec 20 daily  Hyperlipidemia continue Zocor 40 mg daily and a baby aspirin  Followup in 1 year sooner if any problems

## 2011-09-21 NOTE — Patient Instructions (Signed)
Continue your current medications  Followup in 1 year sooner if any problems 

## 2011-10-07 ENCOUNTER — Ambulatory Visit
Admission: RE | Admit: 2011-10-07 | Discharge: 2011-10-07 | Disposition: A | Discharge status: Home / Self Care | Payer: BC Managed Care – HMO | Source: Ambulatory Visit | Attending: Obstetrics and Gynecology | Admitting: Obstetrics and Gynecology

## 2011-10-07 DIAGNOSIS — Z1231 Encounter for screening mammogram for malignant neoplasm of breast: Secondary | ICD-10-CM

## 2011-12-07 ENCOUNTER — Other Ambulatory Visit: Payer: Self-pay | Admitting: Family Medicine

## 2011-12-08 ENCOUNTER — Other Ambulatory Visit: Payer: Self-pay | Admitting: Obstetrics and Gynecology

## 2012-01-25 ENCOUNTER — Telehealth: Payer: Self-pay | Admitting: Family Medicine

## 2012-01-26 ENCOUNTER — Ambulatory Visit: Payer: BC Managed Care – HMO | Admitting: Family

## 2012-01-26 ENCOUNTER — Telehealth: Payer: Self-pay | Admitting: Family Medicine

## 2012-01-26 ENCOUNTER — Encounter (HOSPITAL_COMMUNITY): Payer: Self-pay

## 2012-01-26 ENCOUNTER — Emergency Department (HOSPITAL_COMMUNITY): Payer: BC Managed Care – HMO

## 2012-01-26 ENCOUNTER — Emergency Department (HOSPITAL_COMMUNITY)
Admission: EM | Admit: 2012-01-26 | Discharge: 2012-01-26 | Disposition: A | Discharge status: Home / Self Care | Payer: BC Managed Care – HMO | Attending: Emergency Medicine | Admitting: Emergency Medicine

## 2012-01-26 DIAGNOSIS — Z7982 Long term (current) use of aspirin: Secondary | ICD-10-CM | POA: Insufficient documentation

## 2012-01-26 DIAGNOSIS — E039 Hypothyroidism, unspecified: Secondary | ICD-10-CM | POA: Insufficient documentation

## 2012-01-26 DIAGNOSIS — Z853 Personal history of malignant neoplasm of breast: Secondary | ICD-10-CM | POA: Insufficient documentation

## 2012-01-26 DIAGNOSIS — Z79899 Other long term (current) drug therapy: Secondary | ICD-10-CM | POA: Insufficient documentation

## 2012-01-26 DIAGNOSIS — Z880 Allergy status to penicillin: Secondary | ICD-10-CM | POA: Insufficient documentation

## 2012-01-26 DIAGNOSIS — Z9010 Acquired absence of unspecified breast and nipple: Secondary | ICD-10-CM | POA: Insufficient documentation

## 2012-01-26 DIAGNOSIS — I10 Essential (primary) hypertension: Secondary | ICD-10-CM | POA: Insufficient documentation

## 2012-01-26 DIAGNOSIS — R079 Chest pain, unspecified: Secondary | ICD-10-CM

## 2012-01-26 DIAGNOSIS — K209 Esophagitis, unspecified without bleeding: Secondary | ICD-10-CM | POA: Insufficient documentation

## 2012-01-26 DIAGNOSIS — R0789 Other chest pain: Secondary | ICD-10-CM | POA: Insufficient documentation

## 2012-01-26 DIAGNOSIS — E876 Hypokalemia: Secondary | ICD-10-CM | POA: Insufficient documentation

## 2012-01-26 LAB — BASIC METABOLIC PANEL
BUN: 17 mg/dL (ref 6–23)
Calcium: 9.5 mg/dL (ref 8.4–10.5)
Creatinine, Ser: 0.63 mg/dL (ref 0.50–1.10)
GFR calc Af Amer: >90 mL/min (ref >90)
GFR calc non Af Amer: >90 mL/min (ref >90)
Glucose, Bld: 89 mg/dL (ref 70–99)

## 2012-01-26 LAB — CBC
HCT: 37.4 % (ref 36.0–46.0)
Hemoglobin: 12.8 g/dL (ref 12.0–15.0)
MCH: 31.2 pg (ref 26.0–34.0)
MCHC: 34.2 g/dL (ref 30.0–36.0)
MCV: 91.2 fL (ref 78.0–100.0)
Platelets: 239 K/uL (ref 150–400)
RBC: 4.1 MIL/uL (ref 3.87–5.11)
RDW: 13.5 % (ref 11.5–15.5)
WBC: 5.7 K/uL (ref 4.0–10.5)

## 2012-01-26 MED ORDER — ASPIRIN 325 MG PO TABS
325.0000 mg | ORAL_TABLET | ORAL | Status: AC
  Administered 2012-01-26: 325 mg via ORAL
  Filled 2012-01-26: qty 1

## 2012-01-26 MED ORDER — POTASSIUM CHLORIDE CRYS ER 20 MEQ PO TBCR
20.0000 meq | EXTENDED_RELEASE_TABLET | Freq: Once | ORAL | Status: AC
  Administered 2012-01-26: 20 meq via ORAL
  Filled 2012-01-26: qty 1

## 2012-01-26 NOTE — ED Provider Notes (Signed)
History     CSN: [id number]  Arrival date & time 01/26/12  1115   First MD Initiated Contact with Patient 01/26/12 1157      Chief Complaint  Patient presents with  . Chest Pain    (Consider location/radiation/quality/duration/timing/severity/associated sxs/prior treatment) The history is provided by the patient.  Pt is a 61 y/o F wth PMH well-managed hypothyroidism, HTN,and reflux esophagitis who presents to ED with c/c sharp substernal CP yesterday at rest lasting 1-2 minutes. It was preceded for 2 days by intermittent sharp left axillary pain that was not present during the episode of chest pain. Neither of these has recurred in over 24 hours. There was no associated nausea, SOB, or diaphoresis. Neither location of pain radiating when present. At the time of examination, she complains of bilateral upper back pain and a headache, neither of which is out of the ordinary for her. She has had no recent fever, cough, or other illness. Has never had a cardiac work-up. Non-smoker. +HTN, HLD.  No family hx early CAD.   Past Medical History  Diagnosis Date  . Hypothyroidism   . MHA (microangiopathic hemolytic anemia)   . DUB (dysfunctional uterine bleeding)   . Esophagitis   . Spinal stenosis   . Carotid bruit 08/2008    Normal Doppler  . HTN (hypertension)     Tx started 06/2010  . Breast cancer     Invasive lobular carcinoma RIGHT breast    Past Surgical History  Procedure Date  . Btl   . Mastectomy w/ nodes partial     Right  . Cervical laminectomy     Left    Family History  Problem Relation Age of Onset  . Heart attack Father   . Coronary artery disease Father   . Diabetes Father   . Coronary artery disease Mother   . Multiple sclerosis Sister     Twin    History  Substance Use Topics  . Smoking status: Never Smoker   . Smokeless tobacco: Not on file  . Alcohol Use: No     Review of Systems 10 systems reviewed and are negative for acute change except as noted  in the HPI.  Allergies  Erythromycin; Penicillins; and Clindamycin/lincomycin  Home Medications   Current Outpatient Rx  Name Route Sig Dispense Refill  . ASPIRIN 81 MG PO TBEC Oral Take 81 mg by mouth daily.      . BC HEADACHE POWDER PO Oral Take 1 packet by mouth 2 (two) times daily as needed. For migraines    . CALCIUM CARBONATE 600 MG PO TABS Oral Take 600 mg by mouth 2 (two) times daily with a meal.    . GLUCOSAMINE PO Oral Take 1 tablet by mouth 2 (two) times daily.    Marland Kitchen LISINOPRIL-HYDROCHLOROTHIAZIDE 20-25 MG PO TABS Oral Take 1 tablet by mouth daily. 100 tablet 3  . OMEPRAZOLE MAGNESIUM 20 MG PO TBEC Oral Take 1 tablet (20 mg total) by mouth daily. 100 tablet 3  . SIMVASTATIN 40 MG PO TABS Oral Take 1 tablet (40 mg total) by mouth at bedtime. 100 tablet 3  . SYNTHROID 88 MCG PO TABS  TAKE 1 TABLET DAILY 90 tablet 3    BP 124/67  Pulse 82  Temp 97.6 F (36.4 C) (Oral)  Resp 22  Ht 5\' 2"  (1.575 m)  Wt 134 lb (60.782 kg)  BMI 24.51 kg/m2  SpO2 100%  Physical Exam  Constitutional: She appears well-developed and well-nourished. No distress.  Vital signs are reviewed and are normal.  HENT:  Head: Normocephalic and atraumatic.  Right Ear: External ear normal.  Left Ear: External ear normal.       MMM  Eyes: Conjunctivae are normal. Pupils are equal, round, and reactive to light.  Neck: Neck supple.  Cardiovascular: Normal rate, regular rhythm and normal heart sounds.        Bilateral radial and DP pulses are 2+  Pulmonary/Chest: Effort normal and breath sounds normal. No respiratory distress. She has no wheezes. She has no rales. She exhibits no tenderness.  Abdominal: Soft. Bowel sounds are normal. She exhibits no distension. There is no tenderness.  Musculoskeletal: She exhibits no edema and no tenderness.  Neurological: She is alert.  Skin: Skin is warm and dry. She is not diaphoretic.  Psychiatric: She has a normal mood and affect.    ED Course  Procedures  (including critical care time)  Labs Reviewed  BASIC METABOLIC PANEL - Abnormal; Notable for the following:    Potassium 3.4 (*)     All other components within normal limits  CBC  POCT I-STAT TROPONIN I   Dg Chest 2 View  01/26/2012  *RADIOLOGY REPORT*  Clinical Data: Left-sided chest pain.  CHEST - 2 VIEW  Comparison: None.  Findings: There is a 8 mm nodular appearing density at the right lung base.  This may represent a confluence of vascular structures but I cannot exclude a pulmonary nodule.  Vague density at the left lung base just above the diaphragm is felt to represent calcification in the costal cartilage.  Lungs are otherwise clear.  Heart size and vascularity are normal. No pneumothorax.  No osseous abnormality.  IMPRESSION: Possible nodule at the right lung base.  Original Report Authenticated By: Gwynn Burly, M.D.     1. Chest pain   2. Hypokalemia       MDM  61 y/o F with atypical chest pain/left axillary pain. No pain in the last 24 hours with negative troponin today and non-ischemic EKG is reassuring (please see attending MD note for EKG interpretation) and I am comfortable. Very slight hypokalemia, otherwise unremarkable CBC and BMP. CXR is reviewed and results discussed with pt- she will f/u with her doctor for recheck and any necessary further testing based on her risk factors (prior breast cancer). Pt voices understanding of plan.        Shaaron Adler, PA-C 01/26/12 [address] [place], [place] 01/26/12 1437

## 2012-01-26 NOTE — ED Notes (Signed)
Pt presents with 2-3 day h/o intermittent L axillary pain.  Pt reports pain resolves on it's own, reports onset of mid-sternal chest pain that began yesterday, is intermittent and radiates into L arm and across both scapula.  -nausea or shortness of breath.

## 2012-01-26 NOTE — ED Notes (Signed)
Patient received to P[facility] 7 with complaint of midsternal chest pain onset yesterday. She called her PMD's after hour office and was instructed to call back today and was advised to come to the ER. Pt denies any dizziness, SOB, no N/V, no fever. Pt is also complaining of shoulder pain and headache at present. Pt is A/A/Ox4, skin is warm and dry, respiration is even and unlabored.

## 2012-01-26 NOTE — ED Provider Notes (Signed)
61 year old female had pain in the left axillary area about 3 days ago. Pain would last up to a minute or so and resolve. She had several episodes over several days. She's not had any pain over the last 3 days. Yesterday, she had a single episode of a midsternal pain which was sharp and lasted about 1 minute and resolved. No associated dyspnea, nausea, vomiting, diaphoresis. She called her doctor yesterday and was told to make an appointment today. She was then told to come to the emergency department. Workup in the emergency department is completely negative. Her exam is completely normal with no chest wall tenderness and normal cardiac exam and normal pulmonary exam. At this point, a troponin and can effectively rule out acute coronary injury since last episode of pain was nearly 24 hours ago. She is discharged to followup with her PCP. No indication for provocative stress testing or coronary CT scan at this time.   Date: 01/26/2012  Rate: 81  Rhythm: normal sinus rhythm  QRS Axis: normal  Intervals: normal  ST/T Wave abnormalities: normal  Conduction Disutrbances:none  Narrative Interpretation: Low QRS voltage. No prior ECG available for comparison.  Old EKG Reviewed: none available    Dione Booze, MD 01/26/12 1349

## 2012-01-26 NOTE — ED Notes (Signed)
S.Wolfe PAC is at the bedside.

## 2012-01-26 NOTE — Telephone Encounter (Signed)
Caller: [name]/Patient; PCP: Roderick Pee.; CB#: [phone];  Call regarding Chest Pain (mid substernal) 01/25/12 lasting 1-2 minutes; Now has Shoulder Pain across shoulders With Headache 01/26/12;  Afebrile. LMP; menopausal.  BTL. Denies chest pain or shortness of breath. Felt anxious during angina 01/25/12.  Advised to see ED now for cardiac symptoms within last 24 hrs and known CAD per Chest Pain Guideline. Reports she was scheduled to see MD at 1130 01/26/12; Office: please note pt was sent to [facility] ED.

## 2012-01-27 NOTE — ED Provider Notes (Signed)
Medical screening examination/treatment/procedure(s) were conducted as a shared visit with non-physician practitioner(s) and myself.  I personally evaluated the patient during the encounter   Jakwan Sally, MD 01/27/12 2149 

## 2012-03-31 ENCOUNTER — Ambulatory Visit (INDEPENDENT_AMBULATORY_CARE_PROVIDER_SITE_OTHER): Payer: BC Managed Care – HMO | Admitting: Family

## 2012-03-31 ENCOUNTER — Encounter: Payer: Self-pay | Admitting: Family

## 2012-03-31 VITALS — BP 110/78 | Temp 98.4°F | Wt 132.0 lb

## 2012-03-31 DIAGNOSIS — H9209 Otalgia, unspecified ear: Secondary | ICD-10-CM

## 2012-03-31 DIAGNOSIS — H6980 Other specified disorders of Eustachian tube, unspecified ear: Secondary | ICD-10-CM

## 2012-03-31 DIAGNOSIS — M25551 Pain in right hip: Secondary | ICD-10-CM

## 2012-03-31 DIAGNOSIS — M25559 Pain in unspecified hip: Secondary | ICD-10-CM

## 2012-03-31 MED ORDER — PREDNISONE 20 MG PO TABS: 60.0000 mg | ORAL_TABLET | Freq: Every day | ORAL | Status: DC

## 2012-03-31 NOTE — Progress Notes (Signed)
Subjective:    Patient ID: Patricia Solis, female    DOB: 02/17/1951, 61 y.o.   MRN: [id number]  HPI 61 year old white female, nonsmoker, patient of Dr. Tawanna Cooler is in today with complaints of right ear pain that began this morning. She denies any drainage or discharge from her ear. Reports congestion is morning that has since cleared. She continues to have right ear pain, popping and cracking. Denies any fever, muscle aches or pain.  Patient reports right hip pain that is under the management of orthopedics. Her last cortisone injection was fall 2012.  Review of Systems  Constitutional: Negative.   HENT: Positive for ear pain and congestion. Negative for sore throat, rhinorrhea, sneezing, postnasal drip and ear discharge.   Respiratory: Negative.   Cardiovascular: Negative.   Musculoskeletal: Positive for gait problem. Negative for back pain and joint swelling.       Right hip pain  Skin: Negative.   Hematological: Negative.    Past Medical History  Diagnosis Date  . Hypothyroidism   . MHA (microangiopathic hemolytic anemia)   . DUB (dysfunctional uterine bleeding)   . Esophagitis   . Spinal stenosis   . Carotid bruit 08/2008    Normal Doppler  . HTN (hypertension)     Tx started 06/2010  . Breast cancer     Invasive lobular carcinoma RIGHT breast    History   Social History  . Marital Status: Married    Spouse Name: N/A    Number of Children: N/A  . Years of Education: N/A   Occupational History  . Not on file.   Social History Main Topics  . Smoking status: Never Smoker   . Smokeless tobacco: Not on file  . Alcohol Use: No  . Drug Use: No  . Sexually Active: Not on file   Other Topics Concern  . Not on file   Social History Narrative   Regular exercise - YES    Past Surgical History  Procedure Date  . Btl   . Mastectomy w/ nodes partial     Right  . Cervical laminectomy     Left    Family History  Problem Relation Age of Onset  . Heart attack Father     . Coronary artery disease Father   . Diabetes Father   . Coronary artery disease Mother   . Multiple sclerosis Sister     Twin    Allergies  Allergen Reactions  . Erythromycin     REACTION: rash  . Penicillins     REACTION: rash  . Clindamycin/Lincomycin Rash    Current Outpatient Prescriptions on File Prior to Visit  Medication Sig Dispense Refill  . aspirin 81 MG EC tablet Take 81 mg by mouth daily.        . Aspirin-Salicylamide-Caffeine (BC HEADACHE POWDER PO) Take 1 packet by mouth 2 (two) times daily as needed. For migraines      . calcium carbonate (OS-CAL) 600 MG TABS Take 600 mg by mouth 2 (two) times daily with a meal.      . GLUCOSAMINE PO Take 1 tablet by mouth 2 (two) times daily.      Marland Kitchen lisinopril-hydrochlorothiazide (PRINZIDE,ZESTORETIC) 20-25 MG per tablet Take 1 tablet by mouth daily.  100 tablet  3  . omeprazole (PRILOSEC OTC) 20 MG tablet Take 1 tablet (20 mg total) by mouth daily.  100 tablet  3  . simvastatin (ZOCOR) 40 MG tablet Take 1 tablet (40 mg total) by mouth at bedtime.  100 tablet  3  . SYNTHROID 88 MCG tablet TAKE 1 TABLET DAILY  90 tablet  3    BP 110/78  Temp 98.4 F (36.9 C) (Oral)  Wt 132 lb (59.875 kg)chart    Objective:   Physical Exam  Constitutional: She is oriented to person, place, and time. She appears well-developed and well-nourished.  HENT:  Right Ear: External ear normal.  Left Ear: External ear normal.  Nose: Nose normal.  Mouth/Throat: Oropharynx is clear and moist.       Small amount of fluid to the right tympanic membrane. Otherwise normal.  Neck: Normal range of motion. Neck supple.  Cardiovascular: Normal rate, regular rhythm and normal heart sounds.   Pulmonary/Chest: Effort normal and breath sounds normal.  Musculoskeletal: Normal range of motion. She exhibits no edema.       Normal gait. No tenderness to palpation of the right greater trochanter  Neurological: She is alert and oriented to person, place, and time.   Skin: Skin is warm.  Psychiatric: She has a normal mood and affect.          Assessment & Plan:   Assessment: Eustachian tube dysfunction, right ear pain, right hip pain  Plan: Prednisone 60 mg by mouth x5 days. Followup with orthopedics. Patient call the office if symptoms worsen or persist. Recheck a schedule, and when necessary.

## 2012-03-31 NOTE — Patient Instructions (Signed)
Barotitis Media Barotitis media is soreness (inflammation) of the area behind the eardrum (middle ear). This occurs when the auditory tube (Eustachian tube) leading from the back of the throat to the eardrum is blocked. When it is blocked air cannot move in and out of the middle ear to equalize pressure changes. These pressure changes come from changes in altitude when:  Flying.   Driving in the mountains.   Diving.  Problems are more likely to occur with pressure changes during times when you are congested as from:  Hay fever.   Upper respiratory infection.   A cold.  Damage or hearing loss (barotrauma) caused by this may be permanent. HOME CARE INSTRUCTIONS   Use medicines as recommended by your caregiver. Over the counter medicines will help unblock the canal and can help during times of air travel.   Do not put anything into your ears to clean or unplug them. Eardrops will not be helpful.   Do not swim, dive, or fly until your caregiver says it is all right to do so. If these activities are necessary, chewing gum with frequent swallowing may help. It is also helpful to hold your nose and gently blow to pop your ears for equalizing pressure changes. This forces air into the Eustachian tube.   For little ones with problems, give your baby a bottle of water or juice during periods when pressure changes would be anticipated such as during take offs and landings associated with air travel.   Only take over-the-counter or prescription medicines for pain, discomfort, or fever as directed by your caregiver.   A decongestant may be helpful in de-congesting the middle ear and make pressure equalization easier. This can be even more effective if the drops (spray) are delivered with the head lying over the edge of a bed with the head tilted toward the ear on the affected side.   If your caregiver has given you a follow-up appointment, it is very important to keep that appointment. Not keeping  the appointment could result in a chronic or permanent injury, pain, hearing loss and disability. If there is any problem keeping the appointment, you must call back to this facility for assistance.  SEEK IMMEDIATE MEDICAL CARE IF:   You develop a severe headache, dizziness, severe ear pain, or bloody or pus-like drainage from your ears.   An oral temperature above 102 F (38.9 C) develops.   Your problems do not improve or become worse.  MAKE SURE YOU:   Understand these instructions.   Will watch your condition.   Will get help right away if you are not doing well or get worse.  Document Released: 07/09/2000 Document Revised: 07/01/2011 Document Reviewed: 02/15/2008 [employer] Patient Information 2012 [employer]. 

## 2012-04-10 ENCOUNTER — Ambulatory Visit (INDEPENDENT_AMBULATORY_CARE_PROVIDER_SITE_OTHER): Payer: BC Managed Care – HMO | Admitting: Family Medicine

## 2012-04-10 ENCOUNTER — Encounter: Payer: Self-pay | Admitting: Family Medicine

## 2012-04-10 VITALS — BP 140/80 | Temp 98.6°F | Wt 136.0 lb

## 2012-04-10 DIAGNOSIS — I10 Essential (primary) hypertension: Secondary | ICD-10-CM

## 2012-04-10 NOTE — Progress Notes (Signed)
  Subjective:    Patient ID: Ferman Hamming, female    DOB: 1951-02-05, 61 y.o.   MRN: [id number]  HPI Patricia Solis is a 61 year old married female who comes in today accompanied by her husband for evaluation of a syncopal episode  She states [place] were [place] this [place] weekend she was sitting at a picnic table was very hot she began to feel lightheaded laid down on the step but then fell off a picnic table. Her glasses hit her eye otherwise she felt fine. She continued at the picnic and then went home rested ate dinner otherwise has felt normal. She takes lisinopril 20/25 for hypertension every morning blood pressure 140/80 no arrhythmia.  Review of systems negative except she says when she gets blood sugar oftentimes feel lightheaded but she's never passed out   Review of Systems    general and cardiovascular review of systems otherwise negative Objective:   Physical Exam Well-developed and nourished female no acute distress HEENT negative except for contusion upper eyelid for more her glasses hit her eye I excels is normal she is oriented alert       Assessment & Plan:  Syncopal episode plan reassured BP check 3 times daily return when necessary

## 2012-04-10 NOTE — Patient Instructions (Signed)
Check your blood pressure morning 2:00 in the afternoon and bedtime and fax me the numbers in a week at [phone]

## 2012-06-29 ENCOUNTER — Telehealth: Payer: Self-pay | Admitting: Family Medicine

## 2012-06-29 NOTE — Telephone Encounter (Signed)
[facility] Triage Call Report Triage Record Num: [id number] Operator: Remonia Richter Patient Name: Patricia Solis Call Date & Time: 06/28/2012 6:08:27PM Patient Phone: [phone] PCP: Eugenio Hoes. Todd Patient Gender: Female PCP Fax : [phone] Patient DOB: 17-Oct-1950 [facility] Name[facility] Reason for Call: Caller: [name]/Patient; PCP: [name] (Family Practice); CB#: [phone]; Call regarding Swallowing problem; she feeels a tightness to throat with swallowing x 1 week,feels as if she is having a problem with her thyroid as in past, denies emergent symptoms per Swallowing Difficulty Guideline,See in 2 week disposition ,gradual symptom occuring,appointments reviewed, she prefers ro call office in am for next avail appointment with Dr [name] perimeters gone over Protocol(s) Used: Swallowing Difficulty Recommended Outcome per Protocol: See Provider within 2 Weeks Reason for Outcome: Gradual onset of swallowing difficulty in the past month Care Advice: Difficulty swallowing may be due to serious problems such as growth in the esophagus, thyroid disease, cardiac disease, or pre-stroke condition AND must be evaluated by provider. ~ ~ SYMPTOM / CONDITION MANAGEMENT 12/

## 2012-07-11 ENCOUNTER — Encounter: Payer: Self-pay | Admitting: Family Medicine

## 2012-07-11 ENCOUNTER — Ambulatory Visit (INDEPENDENT_AMBULATORY_CARE_PROVIDER_SITE_OTHER): Payer: BC Managed Care – HMO | Admitting: Family Medicine

## 2012-07-11 VITALS — BP 118/78 | Temp 98.2°F | Wt 133.0 lb

## 2012-07-11 DIAGNOSIS — Z2911 Encounter for prophylactic immunotherapy for respiratory syncytial virus (RSV): Secondary | ICD-10-CM

## 2012-07-11 DIAGNOSIS — K222 Esophageal obstruction: Secondary | ICD-10-CM

## 2012-07-11 DIAGNOSIS — Z23 Encounter for immunization: Secondary | ICD-10-CM

## 2012-07-11 NOTE — Progress Notes (Signed)
  Subjective:    Patient ID: Patricia Solis, female    DOB: 04/13/1951, 61 y.o.   MRN: [id number]  HPI Patricia Solis is a delightful 61 year old married female nonsmoker who comes in today with a 2-3 week history of difficulty swallowing. She's had a history of esophageal stricture and has been dilated in the past by Dr. Randa Evens. She thinks there may be something with her thyroid. She is taking her Synthroid daily. She denies any difficulty swallowing food.   Review of Systems GI review of systems otherwise negative    Objective:   Physical Exam Well-developed well-nourished female no acute distress examination or CAT is normal neck is normal thyroid is symmetrical not enlarged       Assessment & Plan:  Symptoms consistent with upper esophageal stricture plan soft diet GI consult with Dr. Randa Evens ASAP

## 2012-07-11 NOTE — Patient Instructions (Signed)
Soft diet  Call Dr. Randa Evens office and make an appointment to be seen ASAP

## 2012-07-21 ENCOUNTER — Other Ambulatory Visit: Payer: Self-pay | Admitting: Obstetrics and Gynecology

## 2012-07-21 DIAGNOSIS — Z9889 Other specified postprocedural states: Secondary | ICD-10-CM

## 2012-07-21 DIAGNOSIS — Z1231 Encounter for screening mammogram for malignant neoplasm of breast: Secondary | ICD-10-CM

## 2012-07-21 DIAGNOSIS — Z853 Personal history of malignant neoplasm of breast: Secondary | ICD-10-CM

## 2012-08-26 LAB — HM COLONOSCOPY

## 2012-09-22 ENCOUNTER — Other Ambulatory Visit: Payer: Self-pay | Admitting: *Deleted

## 2012-09-22 DIAGNOSIS — E785 Hyperlipidemia, unspecified: Secondary | ICD-10-CM

## 2012-09-22 DIAGNOSIS — I10 Essential (primary) hypertension: Secondary | ICD-10-CM

## 2012-09-22 MED ORDER — SIMVASTATIN 40 MG PO TABS: 40.0000 mg | ORAL_TABLET | Freq: Every day | ORAL | Status: DC

## 2012-09-22 MED ORDER — LISINOPRIL-HYDROCHLOROTHIAZIDE 20-25 MG PO TABS: 1.0000 | ORAL_TABLET | Freq: Every day | ORAL | Status: DC

## 2012-10-02 ENCOUNTER — Telehealth: Payer: Self-pay | Admitting: Family Medicine

## 2012-10-02 DIAGNOSIS — E785 Hyperlipidemia, unspecified: Secondary | ICD-10-CM

## 2012-10-02 DIAGNOSIS — I10 Essential (primary) hypertension: Secondary | ICD-10-CM

## 2012-10-02 MED ORDER — SIMVASTATIN 40 MG PO TABS: 40.0000 mg | ORAL_TABLET | Freq: Every day | ORAL | Status: DC

## 2012-10-02 MED ORDER — LISINOPRIL-HYDROCHLOROTHIAZIDE 20-25 MG PO TABS: 1.0000 | ORAL_TABLET | Freq: Every day | ORAL | Status: DC

## 2012-10-02 NOTE — Telephone Encounter (Signed)
Patient called to reschedule her bumped appt and stated that she need a refill of her lisinopril 20-25 mg 1poqd and her simvastatin 40 mg 1po at bedtime [facility]. Please assist.

## 2012-10-02 NOTE — Telephone Encounter (Signed)
Rx sent to [facility].   

## 2012-10-11 ENCOUNTER — Ambulatory Visit: Payer: BC Managed Care – HMO

## 2012-10-11 ENCOUNTER — Other Ambulatory Visit: Payer: BC Managed Care – HMO

## 2012-10-18 ENCOUNTER — Ambulatory Visit
Admission: RE | Admit: 2012-10-18 | Discharge: 2012-10-18 | Disposition: A | Discharge status: Home / Self Care | Payer: BC Managed Care – PPO | Source: Ambulatory Visit | Attending: Obstetrics and Gynecology | Admitting: Obstetrics and Gynecology

## 2012-10-18 ENCOUNTER — Encounter: Payer: BC Managed Care – HMO | Admitting: Family Medicine

## 2012-10-18 DIAGNOSIS — Z853 Personal history of malignant neoplasm of breast: Secondary | ICD-10-CM

## 2012-10-18 DIAGNOSIS — Z1231 Encounter for screening mammogram for malignant neoplasm of breast: Secondary | ICD-10-CM

## 2012-10-18 DIAGNOSIS — Z9889 Other specified postprocedural states: Secondary | ICD-10-CM

## 2012-11-23 ENCOUNTER — Other Ambulatory Visit (INDEPENDENT_AMBULATORY_CARE_PROVIDER_SITE_OTHER): Payer: BC Managed Care – PPO

## 2012-11-23 DIAGNOSIS — Z0000 Encounter for general adult medical examination without abnormal findings: Secondary | ICD-10-CM

## 2012-11-23 LAB — HEPATIC FUNCTION PANEL
Alkaline Phosphatase: 52 U/L (ref 39–117)
Bilirubin, Direct: 0 mg/dL (ref 0.0–0.3)

## 2012-11-23 LAB — BASIC METABOLIC PANEL
BUN: 19 mg/dL (ref 6–23)
CO2: 28 mEq/L (ref 19–32)
Calcium: 9.3 mg/dL (ref 8.4–10.5)
Creatinine, Ser: 0.7 mg/dL (ref 0.4–1.2)
Glucose, Bld: 86 mg/dL (ref 70–99)
Sodium: 134 mEq/L — ABNORMAL LOW (ref 135–145)

## 2012-11-23 LAB — POCT URINALYSIS DIPSTICK
Bilirubin, UA: NEGATIVE
Ketones, UA: NEGATIVE

## 2012-11-23 LAB — TSH: TSH: 4.44 u[IU]/mL (ref 0.35–5.50)

## 2012-11-23 LAB — CBC WITH DIFFERENTIAL/PLATELET
Basophils Absolute: 0 10*3/uL (ref 0.0–0.1)
Eosinophils Absolute: 0.2 10*3/uL (ref 0.0–0.7)
Hemoglobin: 13.2 g/dL (ref 12.0–15.0)
Lymphocytes Relative: 31.8 % (ref 12.0–46.0)
Lymphs Abs: 1.5 10*3/uL (ref 0.7–4.0)
MCHC: 33.9 g/dL (ref 30.0–36.0)
Neutro Abs: 2.6 10*3/uL (ref 1.4–7.7)
Platelets: 306 10*3/uL (ref 150.0–400.0)
RDW: 13.5 % (ref 11.5–14.6)

## 2012-11-23 LAB — LIPID PANEL
Cholesterol: 182 mg/dL (ref 0–200)
HDL: 63.1 mg/dL (ref >39.00)

## 2012-11-30 ENCOUNTER — Encounter: Payer: Self-pay | Admitting: Family Medicine

## 2012-11-30 ENCOUNTER — Ambulatory Visit (INDEPENDENT_AMBULATORY_CARE_PROVIDER_SITE_OTHER): Payer: BC Managed Care – PPO | Admitting: Family Medicine

## 2012-11-30 VITALS — BP 108/70 | Temp 98.4°F | Ht 61.0 in | Wt 138.0 lb

## 2012-11-30 DIAGNOSIS — E039 Hypothyroidism, unspecified: Secondary | ICD-10-CM

## 2012-11-30 DIAGNOSIS — E785 Hyperlipidemia, unspecified: Secondary | ICD-10-CM

## 2012-11-30 DIAGNOSIS — I10 Essential (primary) hypertension: Secondary | ICD-10-CM

## 2012-11-30 MED ORDER — SIMVASTATIN 40 MG PO TABS: 40.0000 mg | ORAL_TABLET | Freq: Every day | ORAL | Status: DC

## 2012-11-30 MED ORDER — LISINOPRIL-HYDROCHLOROTHIAZIDE 10-12.5 MG PO TABS: 1.0000 | ORAL_TABLET | Freq: Every day | ORAL | Status: DC

## 2012-11-30 MED ORDER — OMEPRAZOLE MAGNESIUM 20 MG PO TBEC: 20.0000 mg | DELAYED_RELEASE_TABLET | Freq: Every day | ORAL | Status: DC

## 2012-11-30 MED ORDER — LEVOTHYROXINE SODIUM 88 MCG PO TABS: ORAL_TABLET | ORAL | Status: DC

## 2012-11-30 NOTE — Patient Instructions (Signed)
Continue your current medications except decrease the Zestoretic to 10 mg daily  Check a blood pressure daily in the morning to be sure your blood pressure stays normal,,,,,,, 135/85 or less   Do a thorough breast exam monthly,,,,,,,,,,, also a mammogram  Return in one year sooner if any problems

## 2012-11-30 NOTE — Progress Notes (Signed)
  Subjective:    Patient ID: Patricia Solis, female    DOB: 1951-07-10, 62 y.o.   MRN: [id number]  HPI Cianni is a 62 year old married female nonsmoker who comes in today for general physical examination because of a history of hypertension, reflux esophagitis, hyperlipidemia, hypothyroidism, carotid bruit, and right-sided breast cancer  Her medications reviewed the been no changes.  Her BP is 108/70 on lisinopril 20-25. We will half her blood pressure medication  She gets routine eye care, dental care, BSE monthly annual mammography, colonoscopy and GI, vaccinations up-to-date   Review of Systems  Constitutional: Negative.   HENT: Negative.   Eyes: Negative.   Respiratory: Negative.   Cardiovascular: Negative.   Gastrointestinal: Negative.   Genitourinary: Negative.   Musculoskeletal: Negative.   Neurological: Negative.   Psychiatric/Behavioral: Negative.        Objective:   Physical Exam  Constitutional: She appears well-developed and well-nourished.  HENT:  Head: Normocephalic and atraumatic.  Right Ear: External ear normal.  Left Ear: External ear normal.  Nose: Nose normal.  Mouth/Throat: Oropharynx is clear and moist.  Eyes: EOM are normal. Pupils are equal, round, and reactive to light.  Neck: Normal range of motion. Neck supple. No thyromegaly present.  Cardiovascular: Normal rate, regular rhythm, normal heart sounds and intact distal pulses.  Exam reveals no gallop and no friction rub.   No murmur heard. Pulmonary/Chest: Effort normal and breath sounds normal.  Abdominal: Soft. Bowel sounds are normal. She exhibits no distension and no mass. There is no tenderness. There is no rebound.  Genitourinary:  Bilateral breast exam normal except for scar right breast 9:00 from previous lumpectomy. Tattoo marks for postop radiation  Musculoskeletal: Normal range of motion.  Lymphadenopathy:    She has no cervical adenopathy.  Neurological: She is alert. She has normal  reflexes. No cranial nerve deficit. She exhibits normal muscle tone. Coordination normal.  Skin: Skin is warm and dry.  Psychiatric: She has a normal mood and affect. Her behavior is normal. Judgment and thought content normal.          Assessment & Plan:  Healthy female  Hypertension decrease lisinopril to 10 mg daily  Reflux esophagitis continue Prilosec 20 mg daily  Hyperlipidemia continue Zocor 40 mg daily  Hypo-thyroidism continue Synthroid 88 mcg daily  Right carotid bruit asymptomatic aspirin daily  History of right-sided breast cancer postop llumpectomy and postop radiation

## 2012-12-04 ENCOUNTER — Telehealth: Payer: Self-pay | Admitting: Family Medicine

## 2012-12-04 MED ORDER — OMEPRAZOLE 20 MG PO CPDR: 20.0000 mg | DELAYED_RELEASE_CAPSULE | Freq: Every day | ORAL | Status: DC

## 2012-12-04 NOTE — Telephone Encounter (Signed)
Please inform the patient that a new Rx for omeprazole capsules has been sent

## 2012-12-04 NOTE — Telephone Encounter (Signed)
Pharmacist calling to verify omeprazole (PRILOSEC OTC) 20 MG tablet RX. The tablets are not covered by pt's insurance, but the capsules are. The pharmacist wants to make sure she can dispense those in its place. Please assist.

## 2012-12-14 ENCOUNTER — Other Ambulatory Visit: Payer: Self-pay | Admitting: Obstetrics and Gynecology

## 2012-12-19 ENCOUNTER — Other Ambulatory Visit: Payer: Self-pay | Admitting: Obstetrics and Gynecology

## 2012-12-19 DIAGNOSIS — R922 Inconclusive mammogram: Secondary | ICD-10-CM

## 2012-12-27 ENCOUNTER — Ambulatory Visit
Admission: RE | Admit: 2012-12-27 | Discharge: 2012-12-27 | Disposition: A | Discharge status: Home / Self Care | Payer: BC Managed Care – PPO | Source: Ambulatory Visit | Attending: Obstetrics and Gynecology | Admitting: Obstetrics and Gynecology

## 2012-12-27 DIAGNOSIS — R922 Inconclusive mammogram: Secondary | ICD-10-CM

## 2012-12-27 MED ORDER — GADOBENATE DIMEGLUMINE 529 MG/ML IV SOLN
12.0000 mL | Freq: Once | INTRAVENOUS | Status: AC | PRN
  Administered 2012-12-27: 12 mL via INTRAVENOUS

## 2013-04-16 ENCOUNTER — Ambulatory Visit (INDEPENDENT_AMBULATORY_CARE_PROVIDER_SITE_OTHER): Payer: BC Managed Care – PPO | Admitting: Family Medicine

## 2013-04-16 VITALS — BP 128/82 | Temp 98.6°F | Wt 138.0 lb

## 2013-04-16 DIAGNOSIS — R300 Dysuria: Secondary | ICD-10-CM

## 2013-04-16 LAB — POCT URINALYSIS DIPSTICK
Bilirubin, UA: NEGATIVE
Blood, UA: NEGATIVE
Glucose, UA: NEGATIVE
Ketones, UA: NEGATIVE
Spec Grav, UA: <=1.005

## 2013-04-16 MED ORDER — CIPROFLOXACIN HCL 250 MG PO TABS: 250.0000 mg | ORAL_TABLET | Freq: Two times a day (BID) | ORAL | Status: DC

## 2013-04-16 NOTE — Progress Notes (Signed)
Chief Complaint  Patient presents with  . Dysuria    urinary frequency    HPI:  Dysuria: -started a few days ago -symptoms: urinary frequency, a little urgency, a little low back pain - but has this chronically with her back issues and sees Dr. Bryson Ha for this and inj and seeing him next week -denies: fevers, chills, NVD, flank pain, hematuria -hx of remote uti -has not taken anything for symptoms  ROS: See pertinent positives and negatives per HPI.  Past Medical History  Diagnosis Date  . Hypothyroidism   . MHA (microangiopathic hemolytic anemia)   . DUB (dysfunctional uterine bleeding)   . Esophagitis   . Spinal stenosis   . Carotid bruit 08/2008    Normal Doppler  . HTN (hypertension)     Tx started 06/2010  . Breast cancer     Invasive lobular carcinoma RIGHT breast    Past Surgical History  Procedure Laterality Date  . Btl    . Mastectomy w/ nodes partial      Right  . Cervical laminectomy      Left    Family History  Problem Relation Age of Onset  . Heart attack Father   . Coronary artery disease Father   . Diabetes Father   . Coronary artery disease Mother   . Multiple sclerosis Sister     Twin    History   Social History  . Marital Status: Married    Spouse Name: N/A    Number of Children: N/A  . Years of Education: N/A   Social History Main Topics  . Smoking status: Never Smoker   . Smokeless tobacco: Not on file  . Alcohol Use: No  . Drug Use: No  . Sexual Activity: Not on file   Other Topics Concern  . Not on file   Social History Narrative   Regular exercise - YES          Current outpatient prescriptions:aspirin 81 MG EC tablet, Take 81 mg by mouth daily.  , Disp: , Rfl: ;  Aspirin-Salicylamide-Caffeine (BC HEADACHE POWDER PO), Take 1 packet by mouth 2 (two) times daily as needed. For migraines, Disp: , Rfl: ;  calcium carbonate (OS-CAL) 600 MG TABS, Take 600 mg by mouth 2 (two) times daily with a meal., Disp: , Rfl: ;   GLUCOSAMINE PO, Take 1 tablet by mouth 2 (two) times daily., Disp: , Rfl:  levothyroxine (SYNTHROID) 88 MCG tablet, TAKE 1 TABLET DAILY, Disp: 90 tablet, Rfl: 3;  lisinopril-hydrochlorothiazide (PRINZIDE,ZESTORETIC) 10-12.5 MG per tablet, Take 1 tablet by mouth daily., Disp: 90 tablet, Rfl: 3;  omeprazole (PRILOSEC) 20 MG capsule, Take 1 capsule (20 mg total) by mouth daily., Disp: 100 capsule, Rfl: 3;  simvastatin (ZOCOR) 40 MG tablet, Take 1 tablet (40 mg total) by mouth at bedtime., Disp: 100 tablet, Rfl: 3 ciprofloxacin (CIPRO) 250 MG tablet, Take 1 tablet (250 mg total) by mouth 2 (two) times daily., Disp: 6 tablet, Rfl: 0  EXAM:  Filed Vitals:   04/16/13 1557  BP: 128/82  Temp: 98.6 F (37 C)    Body mass index is 26.09 kg/(m^2).  GENERAL: vitals reviewed and listed above, alert, oriented, appears well hydrated and in no acute distress  HEENT: atraumatic, conjunttiva clear, no obvious abnormalities on inspection of external nose and ears  NECK: no obvious masses on inspection  LUNGS: clear to auscultation bilaterally, no wheezes, rales or rhonchi, good air movement  CV: HRRR, no peripheral edema  ABD: soft,  NTTP, no CVA TTP  MS: moves all extremities without noticeable abnormality  PSYCH: pleasant and cooperative, no obvious depression or anxiety  ASSESSMENT AND PLAN:  Discussed the following assessment and plan:  Dysuria - Plan: POCT urinalysis dipstick, Culture, Urine, ciprofloxacin (CIPRO) 250 MG tablet  -a little pyuria, discuss possibility of UTI and she wants to do empiric tx - discussed risks -culture pending, return precuations discussed -has appt with her specialist for her back -Patient advised to return or notify a doctor immediately if symptoms worsen or persist or new concerns arise.  There are no Patient Instructions on file for this visit.   Kriste Basque R.

## 2013-04-18 LAB — URINE CULTURE

## 2013-04-18 NOTE — Progress Notes (Signed)
Quick Note:  Left a message for return call. ______ 

## 2013-07-23 ENCOUNTER — Other Ambulatory Visit: Payer: Self-pay

## 2013-07-23 DIAGNOSIS — Z1231 Encounter for screening mammogram for malignant neoplasm of breast: Secondary | ICD-10-CM

## 2013-10-19 ENCOUNTER — Ambulatory Visit
Admission: RE | Admit: 2013-10-19 | Discharge: 2013-10-19 | Disposition: A | Discharge status: Home / Self Care | Payer: BC Managed Care – PPO | Source: Ambulatory Visit

## 2013-10-19 DIAGNOSIS — Z1231 Encounter for screening mammogram for malignant neoplasm of breast: Secondary | ICD-10-CM

## 2013-11-19 ENCOUNTER — Other Ambulatory Visit: Payer: Self-pay | Admitting: Family Medicine

## 2013-11-19 ENCOUNTER — Telehealth: Payer: Self-pay | Admitting: Family Medicine

## 2013-11-19 DIAGNOSIS — I10 Essential (primary) hypertension: Secondary | ICD-10-CM

## 2013-11-19 MED ORDER — LISINOPRIL-HYDROCHLOROTHIAZIDE 10-12.5 MG PO TABS: 1.0000 | ORAL_TABLET | Freq: Every day | ORAL | Status: DC

## 2013-11-19 NOTE — Telephone Encounter (Signed)
Pt req rx lisinopril-hydrochlorothiazide (PRINZIDE,ZESTORETIC) 10-12.5 MG per tablet   Pharmacy  [facility]

## 2013-11-20 ENCOUNTER — Telehealth: Payer: Self-pay | Admitting: Family Medicine

## 2013-11-20 DIAGNOSIS — E039 Hypothyroidism, unspecified: Secondary | ICD-10-CM

## 2013-11-20 MED ORDER — LEVOTHYROXINE SODIUM 88 MCG PO TABS: ORAL_TABLET | ORAL | Status: DC

## 2013-11-20 NOTE — Telephone Encounter (Signed)
Relevant patient education mailed to patient.  

## 2013-11-20 NOTE — Telephone Encounter (Signed)
[facility] - [place], [place] is requesting re-fill on levothyroxine (SYNTHROID) 88 MCG tablet

## 2013-11-22 ENCOUNTER — Telehealth: Payer: Self-pay | Admitting: Family Medicine

## 2013-11-22 ENCOUNTER — Encounter: Payer: Self-pay | Admitting: Family Medicine

## 2013-11-22 DIAGNOSIS — I10 Essential (primary) hypertension: Secondary | ICD-10-CM

## 2013-11-22 MED ORDER — LISINOPRIL-HYDROCHLOROTHIAZIDE 10-12.5 MG PO TABS: 1.0000 | ORAL_TABLET | Freq: Every day | ORAL | Status: DC

## 2013-11-22 NOTE — Telephone Encounter (Signed)
[facility] from [facility] is calling in regards to pt's rx, states the rx was faxed to her in error, rx should [facility] to [facility], fax# [phone], states it only needs to be changed with correct fax #in the profile.

## 2013-12-03 ENCOUNTER — Encounter: Payer: BC Managed Care – PPO | Admitting: Family Medicine

## 2013-12-20 ENCOUNTER — Encounter: Payer: Self-pay | Admitting: Family Medicine

## 2013-12-20 ENCOUNTER — Ambulatory Visit (INDEPENDENT_AMBULATORY_CARE_PROVIDER_SITE_OTHER): Payer: BC Managed Care – PPO | Admitting: Family Medicine

## 2013-12-20 VITALS — BP 120/84 | Temp 98.7°F | Ht 61.25 in | Wt 140.0 lb

## 2013-12-20 DIAGNOSIS — E785 Hyperlipidemia, unspecified: Secondary | ICD-10-CM

## 2013-12-20 DIAGNOSIS — K2100 Gastro-esophageal reflux disease with esophagitis, without bleeding: Secondary | ICD-10-CM

## 2013-12-20 DIAGNOSIS — E039 Hypothyroidism, unspecified: Secondary | ICD-10-CM

## 2013-12-20 DIAGNOSIS — R0989 Other specified symptoms and signs involving the circulatory and respiratory systems: Secondary | ICD-10-CM

## 2013-12-20 DIAGNOSIS — I10 Essential (primary) hypertension: Secondary | ICD-10-CM

## 2013-12-20 LAB — POCT URINALYSIS DIPSTICK
Bilirubin, UA: NEGATIVE
Blood, UA: NEGATIVE
Glucose, UA: NEGATIVE
KETONES UA: NEGATIVE
Nitrite, UA: NEGATIVE
PROTEIN UA: NEGATIVE
Spec Grav, UA: 1.015
Urobilinogen, UA: 0.2
pH, UA: 7

## 2013-12-20 LAB — CBC WITH DIFFERENTIAL/PLATELET
Basophils Absolute: 0 10*3/uL (ref 0.0–0.1)
Basophils Relative: 0.6 % (ref 0.0–3.0)
Eosinophils Absolute: 0.2 10*3/uL (ref 0.0–0.7)
Eosinophils Relative: 3.9 % (ref 0.0–5.0)
HCT: 39.3 % (ref 36.0–46.0)
HEMOGLOBIN: 13.2 g/dL (ref 12.0–15.0)
LYMPHS ABS: 1.5 10*3/uL (ref 0.7–4.0)
Lymphocytes Relative: 28.2 % (ref 12.0–46.0)
MCHC: 33.6 g/dL (ref 30.0–36.0)
MCV: 92.2 fl (ref 78.0–100.0)
Monocytes Absolute: 0.4 10*3/uL (ref 0.1–1.0)
Monocytes Relative: 6.8 % (ref 3.0–12.0)
NEUTROS ABS: 3.2 10*3/uL (ref 1.4–7.7)
Neutrophils Relative %: 60.5 % (ref 43.0–77.0)
Platelets: 302 10*3/uL (ref 150.0–400.0)
RBC: 4.26 Mil/uL (ref 3.87–5.11)
RDW: 14 % (ref 11.5–15.5)
WBC: 5.4 10*3/uL (ref 4.0–10.5)

## 2013-12-20 LAB — LIPID PANEL
CHOLESTEROL: 210 mg/dL — AB (ref 0–200)
HDL: 71.9 mg/dL (ref >39.00)
LDL CALC: 127 mg/dL — AB (ref 0–99)
Total CHOL/HDL Ratio: 3
Triglycerides: 55 mg/dL (ref 0.0–149.0)
VLDL: 11 mg/dL (ref 0.0–40.0)

## 2013-12-20 LAB — HEPATIC FUNCTION PANEL
ALK PHOS: 46 U/L (ref 39–117)
ALT: 14 U/L (ref 0–35)
AST: 16 U/L (ref 0–37)
Albumin: 4.3 g/dL (ref 3.5–5.2)
BILIRUBIN DIRECT: 0.1 mg/dL (ref 0.0–0.3)
Total Bilirubin: 0.7 mg/dL (ref 0.2–1.2)
Total Protein: 7.1 g/dL (ref 6.0–8.3)

## 2013-12-20 LAB — BASIC METABOLIC PANEL
BUN: 15 mg/dL (ref 6–23)
CHLORIDE: 102 meq/L (ref 96–112)
CO2: 28 mEq/L (ref 19–32)
CREATININE: 0.6 mg/dL (ref 0.4–1.2)
Calcium: 9.7 mg/dL (ref 8.4–10.5)
GFR: 103.39 mL/min (ref >60.00)
Glucose, Bld: 81 mg/dL (ref 70–99)
Potassium: 3.9 mEq/L (ref 3.5–5.1)
Sodium: 140 mEq/L (ref 135–145)

## 2013-12-20 LAB — TSH: TSH: 7.96 u[IU]/mL — AB (ref 0.35–4.50)

## 2013-12-20 MED ORDER — LEVOTHYROXINE SODIUM 88 MCG PO TABS: ORAL_TABLET | ORAL | Status: DC

## 2013-12-20 MED ORDER — OMEPRAZOLE 20 MG PO CPDR: DELAYED_RELEASE_CAPSULE | ORAL | Status: DC

## 2013-12-20 MED ORDER — LISINOPRIL-HYDROCHLOROTHIAZIDE 10-12.5 MG PO TABS: 1.0000 | ORAL_TABLET | Freq: Every day | ORAL | Status: DC

## 2013-12-20 MED ORDER — SIMVASTATIN 40 MG PO TABS: 40.0000 mg | ORAL_TABLET | Freq: Every day | ORAL | Status: DC

## 2013-12-20 NOTE — Patient Instructions (Signed)
Continue your current medications  Walk 30 minutes daily  Return in one year sooner if any problems 

## 2013-12-20 NOTE — Progress Notes (Signed)
Pre visit review using our clinic review tool, if applicable. No additional management support is needed unless otherwise documented below in the visit note. 

## 2013-12-20 NOTE — Progress Notes (Signed)
   Subjective:    Patient ID: Patricia Solis, female    DOB: Nov 14, 1950, 63 y.o.   MRN: [id number]  HPI [name] is a 63 year old married female nonsmoker who comes in today for general physical examination because of a history of hypothyroidism, hypertension, reflux esophagitis, hyperlipidemia  Her medicines have been unchanged  She states he feels well has no complaints.  She gets routine eye care, dental care, BSE monthly, and you mammography, colonoscopy x2 normal. She's never had trouble with her Pap smear she's going yearly. Advised every 3 years.  It 1 juncture were heard a noise in her neck did a carotid Doppler was normal   Review of Systems  Constitutional: Negative.   HENT: Negative.   Eyes: Negative.   Respiratory: Negative.   Cardiovascular: Negative.   Gastrointestinal: Negative.   Genitourinary: Negative.   Musculoskeletal: Negative.   Neurological: Negative.   Psychiatric/Behavioral: Negative.        Objective:   Physical Exam  Nursing note and vitals reviewed. Constitutional: She appears well-developed and well-nourished.  HENT:  Head: Normocephalic and atraumatic.  Right Ear: External ear normal.  Left Ear: External ear normal.  Nose: Nose normal.  Mouth/Throat: Oropharynx is clear and moist.  Eyes: EOM are normal. Pupils are equal, round, and reactive to light.  Neck: Normal range of motion. Neck supple. No thyromegaly present.  Cardiovascular: Normal rate, regular rhythm, normal heart sounds and intact distal pulses.  Exam reveals no gallop and no friction rub.   No murmur heard. Normal carotid exam  Pulmonary/Chest: Effort normal and breath sounds normal.  Abdominal: Soft. Bowel sounds are normal. She exhibits no distension and no mass. There is no tenderness. There is no rebound.  Genitourinary:  Pelvic and breast exam by GYN therefore deferred to them  Musculoskeletal: Normal range of motion.  Lymphadenopathy:    She has no cervical adenopathy.    Neurological: She is alert. She has normal reflexes. No cranial nerve deficit. She exhibits normal muscle tone. Coordination normal.  Skin: Skin is warm and dry.  Psychiatric: She has a normal mood and affect. Her behavior is normal. Judgment and thought content normal.          Assessment & Plan:  Healthy female  Hypothyroidism continue Synthroid  Hypertension ago continue Zestoretic  Reflux esophagitis asymptomatic on Prilosec 20 mg daily  Hyperlipidemia continue simvastatin and aspirin

## 2013-12-25 ENCOUNTER — Other Ambulatory Visit: Payer: Self-pay | Admitting: Obstetrics and Gynecology

## 2014-01-07 ENCOUNTER — Ambulatory Visit (INDEPENDENT_AMBULATORY_CARE_PROVIDER_SITE_OTHER): Payer: BC Managed Care – PPO | Admitting: Family Medicine

## 2014-01-07 ENCOUNTER — Encounter: Payer: Self-pay | Admitting: Family Medicine

## 2014-01-07 VITALS — BP 120/80 | Temp 98.9°F | Wt 139.0 lb

## 2014-01-07 DIAGNOSIS — R059 Cough, unspecified: Secondary | ICD-10-CM

## 2014-01-07 DIAGNOSIS — R05 Cough: Secondary | ICD-10-CM

## 2014-01-07 MED ORDER — HYDROCODONE-HOMATROPINE 5-1.5 MG/5ML PO SYRP: 5.0000 mL | ORAL_SOLUTION | Freq: Three times a day (TID) | ORAL | Status: DC | PRN

## 2014-01-07 MED ORDER — FLUTICASONE PROPIONATE 50 MCG/ACT NA SUSP: 2.0000 | Freq: Every day | NASAL | Status: DC

## 2014-01-07 NOTE — Progress Notes (Signed)
   Subjective:    Patient ID: Patricia Solis, female    DOB: 11-06-1950, 63 y.o.   MRN: [id number]  HPI Patricia Solis is a 63 year old married female nonsmoker who comes in with a five-day history of head congestion sore throat and cough  Her symptoms started 5 days ago with a sore throat went away in a couple days. Now she has had congestion and a cough. No fever   Review of Systems    review of systems otherwise negative Objective:   Physical Exam  Well-developed and nourished female no acute distress vital signs stable she's afebrile HEENT negative neck was supple no adenopathy lungs are clear      Assessment & Plan:  Viral syndrome,,,,,,,,,,, treat symptomatically,

## 2014-01-07 NOTE — Progress Notes (Signed)
Pre visit review using our clinic review tool, if applicable. No additional management support is needed unless otherwise documented below in the visit note. 

## 2014-01-07 NOTE — Patient Instructions (Signed)
Drink lots of water  At bedtime use Afrin nasal spray......... wait 5 minutes,,,,,,,,,,, then use a steroid nasal spray,,,,,,, after 5 nights stop the Afrin but continue steroid nasal spray until your clear  Hydromet 1/2-1 teaspoon 3 times daily. For cough  ,

## 2014-02-25 ENCOUNTER — Other Ambulatory Visit (INDEPENDENT_AMBULATORY_CARE_PROVIDER_SITE_OTHER): Payer: BC Managed Care – PPO

## 2014-02-25 DIAGNOSIS — E039 Hypothyroidism, unspecified: Secondary | ICD-10-CM

## 2014-02-25 LAB — TSH: TSH: 2.03 u[IU]/mL (ref 0.35–4.50)

## 2014-10-09 ENCOUNTER — Other Ambulatory Visit: Payer: Self-pay

## 2014-10-09 DIAGNOSIS — Z853 Personal history of malignant neoplasm of breast: Secondary | ICD-10-CM

## 2014-10-09 DIAGNOSIS — Z1231 Encounter for screening mammogram for malignant neoplasm of breast: Secondary | ICD-10-CM

## 2014-10-21 ENCOUNTER — Ambulatory Visit
Admission: RE | Admit: 2014-10-21 | Discharge: 2014-10-21 | Disposition: A | Discharge status: Home / Self Care | Payer: BLUE CROSS/BLUE SHIELD | Source: Ambulatory Visit

## 2014-10-21 DIAGNOSIS — Z1231 Encounter for screening mammogram for malignant neoplasm of breast: Secondary | ICD-10-CM

## 2014-10-21 DIAGNOSIS — Z853 Personal history of malignant neoplasm of breast: Secondary | ICD-10-CM

## 2014-10-23 ENCOUNTER — Ambulatory Visit (INDEPENDENT_AMBULATORY_CARE_PROVIDER_SITE_OTHER): Payer: BLUE CROSS/BLUE SHIELD | Admitting: Family Medicine

## 2014-10-23 ENCOUNTER — Encounter: Payer: Self-pay | Admitting: Family Medicine

## 2014-10-23 VITALS — BP 128/80 | HR 90 | Temp 98.1°F | Wt 142.0 lb

## 2014-10-23 DIAGNOSIS — R05 Cough: Secondary | ICD-10-CM | POA: Diagnosis not present

## 2014-10-23 DIAGNOSIS — R059 Cough, unspecified: Secondary | ICD-10-CM

## 2014-10-23 MED ORDER — HYDROCODONE-HOMATROPINE 5-1.5 MG/5ML PO SYRP: 5.0000 mL | ORAL_SOLUTION | Freq: Four times a day (QID) | ORAL | Status: AC | PRN

## 2014-10-23 MED ORDER — PREDNISONE 10 MG PO TABS: ORAL_TABLET | ORAL | Status: DC

## 2014-10-23 NOTE — Progress Notes (Signed)
   Subjective:    Patient ID: Patricia Solis, female    DOB: 1951/05/11, 64 y.o.   MRN: [id number]  HPI Acute visit for cough. This past Saturday developed sore throat which is now improved. She's had some nasal congestion. Severe cough at times. No fever. Mild nasal congestion. She tried Delsym cough syrup and cough drops without improvement. Cough is mostly nonproductive. Nonsmoker.  Past Medical History  Diagnosis Date  . Hypothyroidism   . MHA (microangiopathic hemolytic anemia)   . DUB (dysfunctional uterine bleeding)   . Esophagitis   . Spinal stenosis   . Carotid bruit 08/2008    Normal Doppler  . HTN (hypertension)     Tx started 06/2010  . Breast cancer     Invasive lobular carcinoma RIGHT breast   Past Surgical History  Procedure Laterality Date  . Btl    . Mastectomy w/ nodes partial      Right  . Cervical laminectomy      Left    reports that she has never smoked. She does not have any smokeless tobacco history on file. She reports that she does not drink alcohol or use illicit drugs. family history includes Coronary artery disease in her father and mother; Diabetes in her father; Heart attack in her father; Multiple sclerosis in her sister. Allergies  Allergen Reactions  . Erythromycin     REACTION: rash  . Penicillins     REACTION: rash  . Clindamycin/Lincomycin Rash      Review of Systems  Constitutional: Negative for fever and chills.  HENT: Positive for congestion.   Respiratory: Positive for cough.        Objective:   Physical Exam  Constitutional: She appears well-developed and well-nourished.  HENT:  Right Ear: External ear normal.  Left Ear: External ear normal.  Mouth/Throat: Oropharynx is clear and moist.  Neck: Neck supple.  Cardiovascular: Normal rate and regular rhythm.   Pulmonary/Chest: Effort normal and breath sounds normal. No respiratory distress. She has no wheezes. She has no rales.  Lymphadenopathy:    She has no cervical  adenopathy.          Assessment & Plan:  Cough. Suspect acute viral bronchitis. Hycodan cough syrup 1 teaspoon daily at bedtime for nighttime cough. She has had reactive airway issues in the past and requests prednisone. We do not hear any wheezing at this time. We recommended not starting prednisone unless she develops any wheezing

## 2014-10-23 NOTE — Patient Instructions (Signed)

## 2014-10-23 NOTE — Progress Notes (Signed)
Pre visit review using our clinic review tool, if applicable. No additional management support is needed unless otherwise documented below in the visit note. 

## 2014-11-21 ENCOUNTER — Other Ambulatory Visit: Payer: Self-pay | Admitting: Family Medicine

## 2015-01-08 ENCOUNTER — Other Ambulatory Visit: Payer: Self-pay | Admitting: Obstetrics and Gynecology

## 2015-01-09 LAB — CYTOLOGY - PAP

## 2015-02-12 ENCOUNTER — Other Ambulatory Visit: Payer: Self-pay | Admitting: Family Medicine

## 2015-02-12 ENCOUNTER — Other Ambulatory Visit: Payer: Self-pay | Admitting: *Deleted

## 2015-02-12 DIAGNOSIS — E039 Hypothyroidism, unspecified: Secondary | ICD-10-CM

## 2015-02-12 MED ORDER — LEVOTHYROXINE SODIUM 88 MCG PO TABS: ORAL_TABLET | ORAL | Status: DC

## 2015-04-15 ENCOUNTER — Other Ambulatory Visit (INDEPENDENT_AMBULATORY_CARE_PROVIDER_SITE_OTHER): Payer: BLUE CROSS/BLUE SHIELD

## 2015-04-15 DIAGNOSIS — Z0000 Encounter for general adult medical examination without abnormal findings: Secondary | ICD-10-CM | POA: Diagnosis not present

## 2015-04-15 LAB — CBC WITH DIFFERENTIAL/PLATELET
Basophils Absolute: 0 10*3/uL (ref 0.0–0.1)
Basophils Relative: 0.2 % (ref 0.0–3.0)
EOS ABS: 0.1 10*3/uL (ref 0.0–0.7)
EOS PCT: 1.3 % (ref 0.0–5.0)
HCT: 40.3 % (ref 36.0–46.0)
Hemoglobin: 13.6 g/dL (ref 12.0–15.0)
LYMPHS ABS: 0.6 10*3/uL — AB (ref 0.7–4.0)
Lymphocytes Relative: 6.2 % — ABNORMAL LOW (ref 12.0–46.0)
MCHC: 33.7 g/dL (ref 30.0–36.0)
MCV: 93 fl (ref 78.0–100.0)
MONO ABS: 0.4 10*3/uL (ref 0.1–1.0)
Monocytes Relative: 4.7 % (ref 3.0–12.0)
NEUTROS PCT: 87.6 % — AB (ref 43.0–77.0)
Neutro Abs: 8.1 10*3/uL — ABNORMAL HIGH (ref 1.4–7.7)
Platelets: 251 10*3/uL (ref 150.0–400.0)
RBC: 4.33 Mil/uL (ref 3.87–5.11)
RDW: 14.2 % (ref 11.5–15.5)
WBC: 9.3 10*3/uL (ref 4.0–10.5)

## 2015-04-15 LAB — HEPATIC FUNCTION PANEL
ALT: 14 U/L (ref 0–35)
AST: 13 U/L (ref 0–37)
Albumin: 4.1 g/dL (ref 3.5–5.2)
Alkaline Phosphatase: 55 U/L (ref 39–117)
Bilirubin, Direct: 0.1 mg/dL (ref 0.0–0.3)
Total Bilirubin: 0.4 mg/dL (ref 0.2–1.2)
Total Protein: 7.3 g/dL (ref 6.0–8.3)

## 2015-04-15 LAB — LIPID PANEL
Cholesterol: 194 mg/dL (ref 0–200)
HDL: 67.9 mg/dL (ref >39.00)
LDL Cholesterol: 113 mg/dL — ABNORMAL HIGH (ref 0–99)
NonHDL: 126.15
Total CHOL/HDL Ratio: 3
Triglycerides: 68 mg/dL (ref 0.0–149.0)
VLDL: 13.6 mg/dL (ref 0.0–40.0)

## 2015-04-15 LAB — POCT URINALYSIS DIPSTICK
Blood, UA: NEGATIVE
Glucose, UA: NEGATIVE
LEUKOCYTES UA: NEGATIVE
Nitrite, UA: NEGATIVE
PROTEIN UA: NEGATIVE
Spec Grav, UA: 1.025
Urobilinogen, UA: 0.2
pH, UA: 5.5

## 2015-04-15 LAB — BASIC METABOLIC PANEL
BUN: 24 mg/dL — AB (ref 6–23)
CALCIUM: 9.6 mg/dL (ref 8.4–10.5)
CO2: 30 meq/L (ref 19–32)
CREATININE: 0.67 mg/dL (ref 0.40–1.20)
Chloride: 102 mEq/L (ref 96–112)
GFR: 94.14 mL/min (ref >60.00)
Glucose, Bld: 106 mg/dL — ABNORMAL HIGH (ref 70–99)
Potassium: 4 mEq/L (ref 3.5–5.1)
Sodium: 141 mEq/L (ref 135–145)

## 2015-04-15 LAB — TSH: TSH: 4.74 u[IU]/mL — AB (ref 0.35–4.50)

## 2015-04-22 ENCOUNTER — Encounter: Payer: Self-pay | Admitting: Family Medicine

## 2015-04-22 ENCOUNTER — Ambulatory Visit (INDEPENDENT_AMBULATORY_CARE_PROVIDER_SITE_OTHER): Payer: BLUE CROSS/BLUE SHIELD | Admitting: Family Medicine

## 2015-04-22 VITALS — BP 115/78 | HR 78 | Temp 98.5°F | Ht 61.0 in | Wt 143.8 lb

## 2015-04-22 DIAGNOSIS — K222 Esophageal obstruction: Secondary | ICD-10-CM | POA: Diagnosis not present

## 2015-04-22 DIAGNOSIS — E039 Hypothyroidism, unspecified: Secondary | ICD-10-CM

## 2015-04-22 DIAGNOSIS — I10 Essential (primary) hypertension: Secondary | ICD-10-CM

## 2015-04-22 DIAGNOSIS — E785 Hyperlipidemia, unspecified: Secondary | ICD-10-CM | POA: Diagnosis not present

## 2015-04-22 MED ORDER — LISINOPRIL-HYDROCHLOROTHIAZIDE 10-12.5 MG PO TABS: ORAL_TABLET | ORAL | Status: DC

## 2015-04-22 MED ORDER — LEVOTHYROXINE SODIUM 88 MCG PO TABS
ORAL_TABLET | ORAL | Status: DC
Start: 2015-04-22 — End: 2015-08-07

## 2015-04-22 MED ORDER — SIMVASTATIN 40 MG PO TABS: ORAL_TABLET | ORAL | Status: DC

## 2015-04-22 MED ORDER — OMEPRAZOLE 20 MG PO CPDR: DELAYED_RELEASE_CAPSULE | ORAL | Status: DC

## 2015-04-22 NOTE — Progress Notes (Signed)
   Subjective:    Patient ID: Patricia Solis, female    DOB: 14-Oct-1950, 64 y.o.   MRN: [id number]  HPI Patricia Solis is a 64 year old married female nonsmoker who comes in today for general physical examination because of a history of hypothyroidism, hypertension, reflux esophagitis with a history of a stricture, and hyperlipidemia  She says overall she is doing well as has no problem.  Med list reviewed its correct  She gets routine eye care, dental care, BSE monthly, annual mammography, colonoscopy 2014 normal. She had a shingles vaccine at age 26 a flu shot was given today. Tetanus 2013  LMP at age 67 Pap smear this past summer by Dr. Tressia Danas her GYN.  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  She does have a healthcare power of attorney and living well. She does not exercise on a regular basis  Review of Systems  Constitutional: Negative.   HENT: Negative.   Eyes: Negative.   Respiratory: Negative.   Cardiovascular: Negative.   Gastrointestinal: Negative.   Endocrine: Negative.   Genitourinary: Negative.   Musculoskeletal: Negative.   Skin: Negative.   Allergic/Immunologic: Negative.   Neurological: Negative.   Hematological: Negative.   Psychiatric/Behavioral: Negative.        Objective:   Physical Exam  Constitutional: She appears well-developed and well-nourished.  HENT:  Head: Normocephalic and atraumatic.  Right Ear: External ear normal.  Left Ear: External ear normal.  Nose: Nose normal.  Mouth/Throat: Oropharynx is clear and moist.  Eyes: EOM are normal. Pupils are equal, round, and reactive to light.  Neck: Normal range of motion. Neck supple. No JVD present. No tracheal deviation present. No thyromegaly present.  Cardiovascular: Normal rate, regular rhythm, normal heart sounds and intact distal pulses.  Exam reveals no gallop and no friction rub.   No murmur heard. Pulmonary/Chest: Effort normal and breath sounds normal. No  stridor. No respiratory distress. She has no wheezes. She has no rales. She exhibits no tenderness.  Abdominal: Soft. Bowel sounds are normal. She exhibits no distension and no mass. There is no tenderness. There is no rebound and no guarding.  Genitourinary:  Bilateral breast exam normal except for scar right breast at the 9:00 position  Musculoskeletal: Normal range of motion.  Lymphadenopathy:    She has no cervical adenopathy.  Neurological: She is alert. She has normal reflexes. No cranial nerve deficit. She exhibits normal muscle tone. Coordination normal.  Skin: Skin is warm and dry. No rash noted. No erythema. No pallor.  Psychiatric: She has a normal mood and affect. Her behavior is normal. Judgment and thought content normal.  Nursing note and vitals reviewed.         Assessment & Plan:  Healthy female  Hypertension ago continue current therapy  Reflux esophagitis with history of stricture........ continue Prilosec  Hyperlipidemia............ continue good Zocor and aspirin  History of hypothyroidism..... Continue Synthroid

## 2015-04-22 NOTE — Patient Instructions (Signed)
Continue current medications  Follow-up in one year sooner if any problems  Cory nafzinger............ our new adult nurse practitioner from [facility] 

## 2015-04-22 NOTE — Progress Notes (Signed)
Pre visit review using our clinic review tool, if applicable. No additional management support is needed unless otherwise documented below in the visit note. 

## 2015-08-06 ENCOUNTER — Telehealth: Payer: Self-pay | Admitting: Family Medicine

## 2015-08-06 DIAGNOSIS — E039 Hypothyroidism, unspecified: Secondary | ICD-10-CM

## 2015-08-06 NOTE — Telephone Encounter (Signed)
Pt request refill for all of his medications, pt change the insurance company so she request to come pick this up once its done. Please give her a call  Phone # [phone]

## 2015-08-07 MED ORDER — OMEPRAZOLE 20 MG PO CPDR: DELAYED_RELEASE_CAPSULE | ORAL | Status: DC

## 2015-08-07 MED ORDER — SIMVASTATIN 40 MG PO TABS: ORAL_TABLET | ORAL | Status: DC

## 2015-08-07 MED ORDER — LEVOTHYROXINE SODIUM 88 MCG PO TABS: ORAL_TABLET | ORAL | Status: DC

## 2015-08-07 MED ORDER — LISINOPRIL-HYDROCHLOROTHIAZIDE 10-12.5 MG PO TABS: ORAL_TABLET | ORAL | Status: DC

## 2015-08-07 NOTE — Telephone Encounter (Signed)
rx ready for pick up and Left message on machine for patient 

## 2015-09-15 ENCOUNTER — Other Ambulatory Visit: Payer: Self-pay

## 2015-09-15 DIAGNOSIS — Z1231 Encounter for screening mammogram for malignant neoplasm of breast: Secondary | ICD-10-CM

## 2015-10-22 ENCOUNTER — Ambulatory Visit
Admission: RE | Admit: 2015-10-22 | Discharge: 2015-10-22 | Disposition: A | Discharge status: Home / Self Care | Payer: Managed Care, Other (non HMO) | Source: Ambulatory Visit

## 2015-10-22 DIAGNOSIS — Z1231 Encounter for screening mammogram for malignant neoplasm of breast: Secondary | ICD-10-CM

## 2015-10-27 ENCOUNTER — Other Ambulatory Visit: Payer: Self-pay | Admitting: Obstetrics and Gynecology

## 2015-10-27 DIAGNOSIS — R928 Other abnormal and inconclusive findings on diagnostic imaging of breast: Secondary | ICD-10-CM

## 2015-11-05 ENCOUNTER — Ambulatory Visit
Admission: RE | Admit: 2015-11-05 | Discharge: 2015-11-05 | Disposition: A | Discharge status: Home / Self Care | Payer: Managed Care, Other (non HMO) | Source: Ambulatory Visit | Attending: Obstetrics and Gynecology | Admitting: Obstetrics and Gynecology

## 2015-11-05 DIAGNOSIS — R928 Other abnormal and inconclusive findings on diagnostic imaging of breast: Secondary | ICD-10-CM

## 2015-11-19 ENCOUNTER — Ambulatory Visit (INDEPENDENT_AMBULATORY_CARE_PROVIDER_SITE_OTHER): Payer: Managed Care, Other (non HMO) | Admitting: Adult Health

## 2015-11-19 ENCOUNTER — Encounter: Payer: Self-pay | Admitting: Adult Health

## 2015-11-19 VITALS — BP 112/68 | HR 100 | Temp 98.4°F | Resp 18 | Ht 62.0 in | Wt 140.0 lb

## 2015-11-19 DIAGNOSIS — I10 Essential (primary) hypertension: Secondary | ICD-10-CM | POA: Diagnosis not present

## 2015-11-19 DIAGNOSIS — E039 Hypothyroidism, unspecified: Secondary | ICD-10-CM

## 2015-11-19 DIAGNOSIS — Z7189 Other specified counseling: Secondary | ICD-10-CM | POA: Diagnosis not present

## 2015-11-19 DIAGNOSIS — Z7689 Persons encountering health services in other specified circumstances: Secondary | ICD-10-CM

## 2015-11-19 DIAGNOSIS — R4189 Other symptoms and signs involving cognitive functions and awareness: Secondary | ICD-10-CM | POA: Diagnosis not present

## 2015-11-19 NOTE — Patient Instructions (Signed)
It was great seeing you today.   Please make an appointment ( 30 min) to follow up with memory issues.   You can also schedule your physical in September if you would like  Let me know if you need anything.

## 2015-11-19 NOTE — Progress Notes (Signed)
Pre visit review using our clinic review tool, if applicable. No additional management support is needed unless otherwise documented below in the visit note. 

## 2015-11-19 NOTE — Progress Notes (Signed)
Patient presents to clinic today to establish care. She is a pleasant caucasian female  has a past medical history of Hypothyroidism; MHA (microangiopathic hemolytic anemia) ([place]); DUB (dysfunctional uterine bleeding); Esophagitis; Spinal stenosis; Carotid bruit (08/2008); HTN (hypertension); Breast cancer ([place]); Basal cell carcinoma (2007); and Hyperlipidemia.  Her last physical was in September with MD Sherren Mocha.   She has recently had surgery on her left foot. She is in a boot for another three weeks.   Acute Concerns: Establish Care   Chronic Issues:  Memory Issues - This has been an issue for many years. She feels as though she forgets where she places items and things she tells people. She has no history of dementia in her family. Family has started to notice her memory issues. She is not getting lost when driving.   Hypertension  - She feels as though this is well controlled on current medication.   Hypothyroidism - Currently taking Synthroid 83mcg and feels as though this is adequate and is controlling her hypothyroidism well.   Health Maintenance: Dental -- Dr. Nicki Reaper - Twice a year  Vision -- Looking Immunizations -- UTD  Colonoscopy -- 10 year plan. Last was around 65 years old Mammogram --10/2015  PAP -- June 2016  Bone Density -- 2010 - is scheduled   Exercise: Goes to the [employer]  Diet: Eats healthy.   Followed by : GYN  Past Medical History  Diagnosis Date  . Hypothyroidism   . MHA (microangiopathic hemolytic anemia) ([facility])   . DUB (dysfunctional uterine bleeding)   . Esophagitis   . Spinal stenosis   . Carotid bruit 08/2008    Normal Doppler  . HTN (hypertension)     Tx started 06/2010  . Breast cancer ([place])     Invasive lobular carcinoma RIGHT breast    Past Surgical History  Procedure Laterality Date  . Btl    . Mastectomy w/ nodes partial      Right  . Cervical laminectomy      Left    Current Outpatient Prescriptions on File Prior to Visit    Medication Sig Dispense Refill  . aspirin 81 MG EC tablet Take 81 mg by mouth daily.      . Aspirin-Salicylamide-Caffeine (BC HEADACHE POWDER PO) Take 1 packet by mouth 2 (two) times daily as needed. For migraines    . calcium carbonate (OS-CAL) 600 MG TABS Take 600 mg by mouth 2 (two) times daily with a meal.    . GLUCOSAMINE PO Take 1 tablet by mouth 2 (two) times daily.    Marland Kitchen levothyroxine (SYNTHROID) 88 MCG tablet TAKE 1 TABLET DAILY 90 tablet 3  . lisinopril-hydrochlorothiazide (PRINZIDE,ZESTORETIC) 10-12.5 MG tablet TAKE 1 BY MOUTH DAILY 90 tablet 4  . omeprazole (PRILOSEC) 20 MG capsule TAKE 1 BY MOUTH DAILY 90 capsule 4  . simvastatin (ZOCOR) 40 MG tablet TAKE 1 BY MOUTH AT BEDTIME 90 tablet 4   No current facility-administered medications on file prior to visit.    Allergies  Allergen Reactions  . Ambien Cr [Zolpidem] Other (See Comments)    Hallucinations  . Erythromycin     REACTION: rash  . Hydrocodone Other (See Comments)    Hallucinations  . Penicillins     REACTION: rash  . Clindamycin/Lincomycin Rash    Family History  Problem Relation Age of Onset  . Heart attack Father   . Coronary artery disease Father   . Diabetes Father   . Coronary artery disease Mother   .  Multiple sclerosis Sister     Twin    Social History   Social History  . Marital Status: Married    Spouse Name: N/A  . Number of Children: N/A  . Years of Education: N/A   Occupational History  . Not on file.   Social History Main Topics  . Smoking status: Never Smoker   . Smokeless tobacco: Not on file  . Alcohol Use: No  . Drug Use: No  . Sexual Activity: Not on file   Other Topics Concern  . Not on file   Social History Narrative   Regular exercise - YES          Review of Systems  Constitutional: Negative.   HENT: Negative.   Eyes: Negative.   Respiratory: Negative.   Cardiovascular: Negative.   Gastrointestinal: Negative.   Genitourinary: Negative.    Musculoskeletal: Negative.   Skin: Negative.   Neurological: Negative.   Endo/Heme/Allergies: Negative.   Psychiatric/Behavioral: Negative.     BP 112/68 mmHg  Pulse 100  Temp(Src) 98.4 F (36.9 C) (Oral)  Resp 18  Ht 5\' 2"  (1.575 m)  Wt 140 lb (63.504 kg)  BMI 25.60 kg/m2  SpO2 98%  Physical Exam  Constitutional: She is oriented to person, place, and time and well-developed, well-nourished, and in no distress. No distress.  HENT:  Head: Normocephalic and atraumatic.  Right Ear: Hearing, tympanic membrane, external ear and ear canal normal.  Left Ear: Hearing, tympanic membrane, external ear and ear canal normal.  Nose: Nose normal.  Mouth/Throat: Oropharynx is clear and moist. No oropharyngeal exudate.  Eyes: Conjunctivae and EOM are normal. Pupils are equal, round, and reactive to light. Right eye exhibits no discharge. Left eye exhibits no discharge. No scleral icterus.  Cardiovascular: Normal rate, regular rhythm, normal heart sounds and intact distal pulses.  Exam reveals no gallop and no friction rub.   No murmur heard. Pulmonary/Chest: Effort normal and breath sounds normal. No respiratory distress. She has no wheezes. She has no rales. She exhibits no tenderness.  Abdominal: Soft. Bowel sounds are normal. She exhibits no distension. There is no tenderness. There is no rebound and no guarding.  Musculoskeletal: Normal range of motion. She exhibits no edema or tenderness.  Neurological: She is alert and oriented to person, place, and time. Gait normal. GCS score is 15.  Skin: Skin is warm and dry. No rash noted. She is not diaphoretic. No erythema. No pallor.  Psychiatric: Mood, memory, affect and judgment normal.  Nursing note and vitals reviewed.   Assessment/Plan:   1. Encounter to establish care - Follow up in September for CPE - Follow up sooner if needed - Continue to exercise and eat healthy   2. Essential hypertension - Controlled with current therapy. No  change  BP: 112/68 mmHg    3. Hypothyroidism, unspecified hypothyroidism type - Will check her TSH at next appointment.  Lab Results  Component Value Date   TSH 4.74* 04/15/2015   - Continue with current dose of synthroid.   4. Cognitive impairment - She is going to follow up with me at her conviencine so that we can look into this further.  Will do lab work and Mini Mental  - Doubt Dementia related at this time.  - Consider referral to neurology and CT of head  Dorothyann Peng, NP

## 2015-11-27 ENCOUNTER — Ambulatory Visit (INDEPENDENT_AMBULATORY_CARE_PROVIDER_SITE_OTHER): Payer: Managed Care, Other (non HMO) | Admitting: Adult Health

## 2015-11-27 ENCOUNTER — Encounter: Payer: Self-pay | Admitting: Adult Health

## 2015-11-27 VITALS — BP 120/72 | Temp 98.4°F

## 2015-11-27 DIAGNOSIS — R413 Other amnesia: Secondary | ICD-10-CM

## 2015-11-27 LAB — CBC WITH DIFFERENTIAL/PLATELET
BASOS ABS: 0 10*3/uL (ref 0.0–0.1)
Basophils Relative: 0.5 % (ref 0.0–3.0)
EOS ABS: 0.2 10*3/uL (ref 0.0–0.7)
Eosinophils Relative: 2.8 % (ref 0.0–5.0)
HCT: 40 % (ref 36.0–46.0)
HEMOGLOBIN: 13.4 g/dL (ref 12.0–15.0)
Lymphocytes Relative: 25.7 % (ref 12.0–46.0)
Lymphs Abs: 1.6 10*3/uL (ref 0.7–4.0)
MCHC: 33.4 g/dL (ref 30.0–36.0)
MCV: 91.8 fl (ref 78.0–100.0)
MONO ABS: 0.4 10*3/uL (ref 0.1–1.0)
Monocytes Relative: 6.9 % (ref 3.0–12.0)
Neutro Abs: 4.1 10*3/uL (ref 1.4–7.7)
Neutrophils Relative %: 64.1 % (ref 43.0–77.0)
Platelets: 279 10*3/uL (ref 150.0–400.0)
RBC: 4.36 Mil/uL (ref 3.87–5.11)
RDW: 14.2 % (ref 11.5–15.5)
WBC: 6.4 10*3/uL (ref 4.0–10.5)

## 2015-11-27 LAB — BASIC METABOLIC PANEL
BUN: 19 mg/dL (ref 6–23)
CO2: 28 meq/L (ref 19–32)
Calcium: 9.9 mg/dL (ref 8.4–10.5)
Chloride: 102 mEq/L (ref 96–112)
Creatinine, Ser: 0.7 mg/dL (ref 0.40–1.20)
GFR: 89.33 mL/min (ref >60.00)
GLUCOSE: 91 mg/dL (ref 70–99)
POTASSIUM: 4.3 meq/L (ref 3.5–5.1)
SODIUM: 139 meq/L (ref 135–145)

## 2015-11-27 LAB — VITAMIN B12: VITAMIN B 12: 307 pg/mL (ref 211–911)

## 2015-11-27 LAB — TSH: TSH: 1.26 u[IU]/mL (ref 0.35–4.50)

## 2015-11-27 NOTE — Patient Instructions (Signed)
I will follow up with you regarding your labs   Stop taking the Simvastatin for the time being.   Follow up with me in one month

## 2015-11-27 NOTE — Progress Notes (Signed)
Subjective:    Patient ID: Patricia Solis, female    DOB: [birth year], 65 y.o.   MRN: [id number]  HPI 65 year old female who is here for memory issues. She feels as though she cannot remember something she experienced 20 minutes ago. She is not getting lost while driving. She does have to place her keys and wallet in the same area every day or she will forget where she placed them   This started about one year ago and has been getting worse. She reports that her family is starting to notice her forgetfulness.   She has not had this issue in the past.   Is on Simvastatin   Review of Systems  Respiratory: Negative.   Neurological: Negative.   Psychiatric/Behavioral: Positive for confusion and decreased concentration.  All other systems reviewed and are negative.  Past Medical History  Diagnosis Date  . Hypothyroidism   . MHA (microangiopathic hemolytic anemia) (HCC)   . DUB (dysfunctional uterine bleeding)   . Esophagitis   . Spinal stenosis   . Carotid bruit 08/2008    Normal Doppler  . HTN (hypertension)     Tx started 06/2010  . Breast cancer ([place])     Invasive lobular carcinoma RIGHT breast  . Basal cell carcinoma 2007    BCC  . Hyperlipidemia     Social History   Social History  . Marital Status: Married    Spouse Name: N/A  . Number of Children: N/A  . Years of Education: N/A   Occupational History  . Not on file.   Social History Main Topics  . Smoking status: Never Smoker   . Smokeless tobacco: Not on file  . Alcohol Use: No  . Drug Use: No  . Sexual Activity: Not on file   Other Topics Concern  . Not on file   Social History Narrative   Retired in March    Married for 31 years    One daughter, lives in [place]    Has four grandchildren      Likes to play with grand kids, go to [employer] and mountains.           Past Surgical History  Procedure Laterality Date  . Btl    . Mastectomy w/ nodes partial      Right  . Cervical laminectomy      Left   . Foot surgery    . Shoulder arthroscopy Right   . Tubal ligation  1981    Family History  Problem Relation Age of Onset  . Heart attack Father   . Coronary artery disease Father   . Diabetes Father   . Coronary artery disease Mother   . Multiple sclerosis Sister     Twin  . Leukemia Mother   . Thyroid disease Mother     Allergies  Allergen Reactions  . Ambien Cr [Zolpidem] Other (See Comments)    Hallucinations  . Erythromycin     REACTION: rash  . Hydrocodone Other (See Comments)    Hallucinations  . Penicillins     REACTION: rash  . Clindamycin/Lincomycin Rash    Current Outpatient Prescriptions on File Prior to Visit  Medication Sig Dispense Refill  . aspirin 81 MG EC tablet Take 81 mg by mouth daily.      . Aspirin-Salicylamide-Caffeine (BC HEADACHE POWDER PO) Take 1 packet by mouth 2 (two) times daily as needed. For migraines    . calcium carbonate (OS-CAL) 600 MG TABS  Take 600 mg by mouth 2 (two) times daily with a meal.    . GLUCOSAMINE PO Take 1 tablet by mouth 2 (two) times daily.    Marland Kitchen levothyroxine (SYNTHROID) 88 MCG tablet TAKE 1 TABLET DAILY 90 tablet 3  . lisinopril-hydrochlorothiazide (PRINZIDE,ZESTORETIC) 10-12.5 MG tablet TAKE 1 BY MOUTH DAILY 90 tablet 4  . omeprazole (PRILOSEC) 20 MG capsule TAKE 1 BY MOUTH DAILY 90 capsule 4  . simvastatin (ZOCOR) 40 MG tablet TAKE 1 BY MOUTH AT BEDTIME 90 tablet 4   No current facility-administered medications on file prior to visit.    BP 120/72 mmHg  Temp(Src) 98.4 F (36.9 C) (Oral)  Wt        Objective:   Physical Exam  Constitutional: She is oriented to person, place, and time. She appears well-developed and well-nourished. No distress.  Eyes: Pupils are equal, round, and reactive to light. Right eye exhibits no discharge. Left eye exhibits no discharge. No scleral icterus.  Musculoskeletal: Normal range of motion.  Neurological: She is alert and oriented to person, place, and time.  Skin: Skin is  warm and dry. No rash noted. She is not diaphoretic. No erythema. No pallor.  Psychiatric: She has a normal mood and affect. Her behavior is normal. Judgment and thought content normal.  Nursing note and vitals reviewed.      Assessment & Plan:  1. Memory loss - TSH - Vitamin B12 - CBC with Differential - Basic metabolic panel - Stop simvastatin - Follow up in one month or sooner if needed - Consider MRI of brain  - Consider Neurology follow up - Mini Mental Score 30/30  Dorothyann Peng, NP

## 2015-12-30 ENCOUNTER — Ambulatory Visit (INDEPENDENT_AMBULATORY_CARE_PROVIDER_SITE_OTHER): Payer: Managed Care, Other (non HMO) | Admitting: Adult Health

## 2015-12-30 ENCOUNTER — Encounter: Payer: Self-pay | Admitting: Adult Health

## 2015-12-30 VITALS — BP 126/64 | Temp 98.3°F | Ht 62.0 in | Wt 140.0 lb

## 2015-12-30 DIAGNOSIS — R4189 Other symptoms and signs involving cognitive functions and awareness: Secondary | ICD-10-CM | POA: Diagnosis not present

## 2015-12-30 NOTE — Progress Notes (Signed)
Subjective:    Patient ID: Patricia Solis, female    DOB: June 13, 1951, 65 y.o.   MRN: [id number]  HPI  65 year old female who presents to the office today for one month follow up regarding cognitive impairment. When I last saw her, her mimi mental was 30/30. I had her stop simvastatin   Today in the office she reports that she no longer has " aching in my hips and legs and my memory seems to improving. I feel like I can recall items that I would have a hard time recalling a month ago."  She denies any complaints.   Review of Systems  Constitutional: Negative.   Respiratory: Negative.   Cardiovascular: Negative.   Musculoskeletal: Negative.   Neurological: Negative.   Psychiatric/Behavioral: Negative.   All other systems reviewed and are negative.  Past Medical History  Diagnosis Date  . Hypothyroidism   . MHA (microangiopathic hemolytic anemia) (HCC)   . DUB (dysfunctional uterine bleeding)   . Esophagitis   . Spinal stenosis   . Carotid bruit 08/2008    Normal Doppler  . HTN (hypertension)     Tx started 06/2010  . Breast cancer ([place])     Invasive lobular carcinoma RIGHT breast  . Basal cell carcinoma 2007    BCC  . Hyperlipidemia     Social History   Social History  . Marital Status: Married    Spouse Name: N/A  . Number of Children: N/A  . Years of Education: N/A   Occupational History  . Not on file.   Social History Main Topics  . Smoking status: Never Smoker   . Smokeless tobacco: Not on file  . Alcohol Use: No  . Drug Use: No  . Sexual Activity: Not on file   Other Topics Concern  . Not on file   Social History Narrative   Retired in March    Married for 13 years    One daughter, lives in [place]    Has four grandchildren      Likes to play with grand kids, go to [employer] and mountains.           Past Surgical History  Procedure Laterality Date  . Btl    . Mastectomy w/ nodes partial      Right  . Cervical laminectomy      Left  . Foot  surgery    . Shoulder arthroscopy Right   . Tubal ligation  1981    Family History  Problem Relation Age of Onset  . Heart attack Father   . Coronary artery disease Father   . Diabetes Father   . Coronary artery disease Mother   . Multiple sclerosis Sister     Twin  . Leukemia Mother   . Thyroid disease Mother     Allergies  Allergen Reactions  . Ambien Cr [Zolpidem] Other (See Comments)    Hallucinations  . Erythromycin     REACTION: rash  . Hydrocodone Other (See Comments)    Hallucinations  . Penicillins     REACTION: rash  . Clindamycin/Lincomycin Rash    Current Outpatient Prescriptions on File Prior to Visit  Medication Sig Dispense Refill  . aspirin 81 MG EC tablet Take 81 mg by mouth daily.      . Aspirin-Salicylamide-Caffeine (BC HEADACHE POWDER PO) Take 1 packet by mouth 2 (two) times daily as needed. For migraines    . calcium carbonate (OS-CAL) 600 MG TABS Take 600  mg by mouth 2 (two) times daily with a meal.    . GLUCOSAMINE PO Take 1 tablet by mouth 2 (two) times daily.    Marland Kitchen levothyroxine (SYNTHROID) 88 MCG tablet TAKE 1 TABLET DAILY 90 tablet 3  . lisinopril-hydrochlorothiazide (PRINZIDE,ZESTORETIC) 10-12.5 MG tablet TAKE 1 BY MOUTH DAILY 90 tablet 4  . omeprazole (PRILOSEC) 20 MG capsule TAKE 1 BY MOUTH DAILY 90 capsule 4  . simvastatin (ZOCOR) 40 MG tablet TAKE 1 BY MOUTH AT BEDTIME 90 tablet 4   No current facility-administered medications on file prior to visit.    BP 126/64 mmHg  Temp(Src) 98.3 F (36.8 C) (Oral)  Ht 5\' 2"  (1.575 m)  Wt 140 lb (63.504 kg)  BMI 25.60 kg/m2       Objective:   Physical Exam  Constitutional: She is oriented to person, place, and time. She appears well-developed and well-nourished. No distress.  Cardiovascular: Normal rate, regular rhythm, normal heart sounds and intact distal pulses.  Exam reveals no gallop.   No murmur heard. Pulmonary/Chest: Effort normal and breath sounds normal. No respiratory distress.  She has no wheezes. She has no rales. She exhibits no tenderness.  Musculoskeletal: Normal range of motion. She exhibits no edema or tenderness.  Neurological: She is alert and oriented to person, place, and time.  Skin: Skin is warm and dry. No rash noted. She is not diaphoretic. No erythema. No pallor.  Psychiatric: She has a normal mood and affect. Her behavior is normal. Judgment and thought content normal.  Nursing note and vitals reviewed.     Assessment & Plan:  1. Cognitive impairment - Patient reports improvement - Continue not taking simvastatin - Advised she needs to change her eating habits and exercise if she wishes to stay off hypertensive medication  - Will follow up with lipid panel in October when she returns for her annual physical  - Follow up as needed  Dorothyann Peng, NP

## 2016-01-26 DIAGNOSIS — Z13820 Encounter for screening for osteoporosis: Secondary | ICD-10-CM | POA: Diagnosis not present

## 2016-01-26 DIAGNOSIS — Z01419 Encounter for gynecological examination (general) (routine) without abnormal findings: Secondary | ICD-10-CM | POA: Diagnosis not present

## 2016-01-26 DIAGNOSIS — Z6826 Body mass index (BMI) 26.0-26.9, adult: Secondary | ICD-10-CM | POA: Diagnosis not present

## 2016-01-26 DIAGNOSIS — Z124 Encounter for screening for malignant neoplasm of cervix: Secondary | ICD-10-CM | POA: Diagnosis not present

## 2016-01-26 DIAGNOSIS — M8588 Other specified disorders of bone density and structure, other site: Secondary | ICD-10-CM | POA: Diagnosis not present

## 2016-01-26 DIAGNOSIS — N958 Other specified menopausal and perimenopausal disorders: Secondary | ICD-10-CM | POA: Diagnosis not present

## 2016-03-11 DIAGNOSIS — H2511 Age-related nuclear cataract, right eye: Secondary | ICD-10-CM | POA: Diagnosis not present

## 2016-03-11 DIAGNOSIS — H2513 Age-related nuclear cataract, bilateral: Secondary | ICD-10-CM | POA: Diagnosis not present

## 2016-03-26 HISTORY — PX: CATARACT EXTRACTION, BILATERAL: SHX1313

## 2016-04-07 DIAGNOSIS — H2512 Age-related nuclear cataract, left eye: Secondary | ICD-10-CM | POA: Diagnosis not present

## 2016-04-07 DIAGNOSIS — H2511 Age-related nuclear cataract, right eye: Secondary | ICD-10-CM | POA: Diagnosis not present

## 2016-04-14 DIAGNOSIS — H2512 Age-related nuclear cataract, left eye: Secondary | ICD-10-CM | POA: Diagnosis not present

## 2016-04-27 ENCOUNTER — Other Ambulatory Visit: Payer: Self-pay | Admitting: Adult Health

## 2016-04-27 ENCOUNTER — Ambulatory Visit (INDEPENDENT_AMBULATORY_CARE_PROVIDER_SITE_OTHER): Payer: PPO | Admitting: Adult Health

## 2016-04-27 VITALS — BP 128/82 | HR 78 | Wt 140.0 lb

## 2016-04-27 DIAGNOSIS — R300 Dysuria: Secondary | ICD-10-CM

## 2016-04-27 DIAGNOSIS — Z0000 Encounter for general adult medical examination without abnormal findings: Secondary | ICD-10-CM

## 2016-04-27 DIAGNOSIS — Z23 Encounter for immunization: Secondary | ICD-10-CM

## 2016-04-27 DIAGNOSIS — Z760 Encounter for issue of repeat prescription: Secondary | ICD-10-CM

## 2016-04-27 DIAGNOSIS — I10 Essential (primary) hypertension: Secondary | ICD-10-CM

## 2016-04-27 DIAGNOSIS — E039 Hypothyroidism, unspecified: Secondary | ICD-10-CM

## 2016-04-27 LAB — CBC WITH DIFFERENTIAL/PLATELET
Basophils Absolute: 0 10*3/uL (ref 0.0–0.1)
Basophils Relative: 0.6 % (ref 0.0–3.0)
EOS ABS: 0.2 10*3/uL (ref 0.0–0.7)
EOS PCT: 3.5 % (ref 0.0–5.0)
HEMATOCRIT: 40.7 % (ref 36.0–46.0)
HEMOGLOBIN: 13.9 g/dL (ref 12.0–15.0)
LYMPHS PCT: 27.6 % (ref 12.0–46.0)
Lymphs Abs: 1.6 10*3/uL (ref 0.7–4.0)
MCHC: 34.2 g/dL (ref 30.0–36.0)
MCV: 91.3 fl (ref 78.0–100.0)
MONO ABS: 0.3 10*3/uL (ref 0.1–1.0)
Monocytes Relative: 6 % (ref 3.0–12.0)
Neutro Abs: 3.6 10*3/uL (ref 1.4–7.7)
Neutrophils Relative %: 62.3 % (ref 43.0–77.0)
Platelets: 320 10*3/uL (ref 150.0–400.0)
RBC: 4.46 Mil/uL (ref 3.87–5.11)
RDW: 14 % (ref 11.5–15.5)
WBC: 5.7 10*3/uL (ref 4.0–10.5)

## 2016-04-27 LAB — POC URINALSYSI DIPSTICK (AUTOMATED)
Bilirubin, UA: NEGATIVE
Glucose, UA: NEGATIVE
Ketones, UA: NEGATIVE
Nitrite, UA: NEGATIVE
PH UA: 6
PROTEIN UA: NEGATIVE
RBC UA: NEGATIVE
SPEC GRAV UA: 1.015
UROBILINOGEN UA: 0.2

## 2016-04-27 LAB — BASIC METABOLIC PANEL
BUN: 20 mg/dL (ref 6–23)
CALCIUM: 9.5 mg/dL (ref 8.4–10.5)
CO2: 30 mEq/L (ref 19–32)
Chloride: 101 mEq/L (ref 96–112)
Creatinine, Ser: 0.65 mg/dL (ref 0.40–1.20)
GFR: 97.18 mL/min (ref >60.00)
Glucose, Bld: 89 mg/dL (ref 70–99)
Potassium: 4.5 mEq/L (ref 3.5–5.1)
SODIUM: 141 meq/L (ref 135–145)

## 2016-04-27 LAB — LIPID PANEL
CHOL/HDL RATIO: 4
Cholesterol: 292 mg/dL — ABNORMAL HIGH (ref 0–200)
HDL: 68.2 mg/dL (ref >39.00)
LDL Cholesterol: 211 mg/dL — ABNORMAL HIGH (ref 0–99)
NONHDL: 223.89
Triglycerides: 65 mg/dL (ref 0.0–149.0)
VLDL: 13 mg/dL (ref 0.0–40.0)

## 2016-04-27 LAB — HEPATIC FUNCTION PANEL
ALT: 13 U/L (ref 0–35)
AST: 13 U/L (ref 0–37)
Albumin: 4.2 g/dL (ref 3.5–5.2)
Alkaline Phosphatase: 58 U/L (ref 39–117)
BILIRUBIN DIRECT: 0 mg/dL (ref 0.0–0.3)
Total Bilirubin: 0.4 mg/dL (ref 0.2–1.2)
Total Protein: 7.3 g/dL (ref 6.0–8.3)

## 2016-04-27 LAB — TSH: TSH: 1.83 u[IU]/mL (ref 0.35–4.50)

## 2016-04-27 LAB — VITAMIN D 25 HYDROXY (VIT D DEFICIENCY, FRACTURES): VITD: 43.86 ng/mL (ref 30.00–100.00)

## 2016-04-27 MED ORDER — LISINOPRIL-HYDROCHLOROTHIAZIDE 10-12.5 MG PO TABS: ORAL_TABLET | ORAL | 3 refills | Status: DC

## 2016-04-27 MED ORDER — OMEPRAZOLE 20 MG PO CPDR: DELAYED_RELEASE_CAPSULE | ORAL | 4 refills | Status: DC

## 2016-04-27 MED ORDER — OMEPRAZOLE 20 MG PO CPDR: DELAYED_RELEASE_CAPSULE | ORAL | 3 refills | Status: DC

## 2016-04-27 MED ORDER — HYDROCHLOROTHIAZIDE 12.5 MG PO CAPS: 12.5000 mg | ORAL_CAPSULE | Freq: Every day | ORAL | 3 refills | Status: DC

## 2016-04-27 MED ORDER — LISINOPRIL-HYDROCHLOROTHIAZIDE 10-12.5 MG PO TABS: ORAL_TABLET | ORAL | 4 refills | Status: DC

## 2016-04-27 MED ORDER — CALCIUM CARBONATE 600 MG PO TABS: 600.0000 mg | ORAL_TABLET | Freq: Two times a day (BID) | ORAL | 3 refills | Status: DC

## 2016-04-27 NOTE — Addendum Note (Signed)
Addended by: Elmer Picker on: 04/27/2016 04:20 PM   Modules accepted: Orders

## 2016-04-27 NOTE — Patient Instructions (Addendum)
It was great seeing you today!  Your exam was great! I will follow up with you regarding your blood work.   Follow up with me in one year or sooner if needed  Continue to exercise and eat healthy   Health Maintenance, Female Adopting a healthy lifestyle and getting preventive care can go a long way to promote health and wellness. Talk with your health care provider about what schedule of regular examinations is right for you. This is a good chance for you to check in with your provider about disease prevention and staying healthy. In between checkups, there are plenty of things you can do on your own. Experts have done a lot of research about which lifestyle changes and preventive measures are most likely to keep you healthy. Ask your health care provider for more information. WEIGHT AND DIET  Eat a healthy diet  Be sure to include plenty of vegetables, fruits, low-fat dairy products, and lean protein.  Do not eat a lot of foods high in solid fats, added sugars, or salt.  Get regular exercise. This is one of the most important things you can do for your health.  Most adults should exercise for at least 150 minutes each week. The exercise should increase your heart rate and make you sweat (moderate-intensity exercise).  Most adults should also do strengthening exercises at least twice a week. This is in addition to the moderate-intensity exercise.  Maintain a healthy weight  Body mass index (BMI) is a measurement that can be used to identify possible weight problems. It estimates body fat based on height and weight. Your health care provider can help determine your BMI and help you achieve or maintain a healthy weight.  For females 60 years of age and older:   A BMI below 18.5 is considered underweight.  A BMI of 18.5 to 24.9 is normal.  A BMI of 25 to 29.9 is considered overweight.  A BMI of 30 and above is considered obese.  Watch levels of cholesterol and blood lipids  You  should start having your blood tested for lipids and cholesterol at 65 years of age, then have this test every 5 years.  You may need to have your cholesterol levels checked more often if:  Your lipid or cholesterol levels are high.  You are older than 65 years of age.  You are at high risk for heart disease.  CANCER SCREENING   Lung Cancer  Lung cancer screening is recommended for adults 74-69 years old who are at high risk for lung cancer because of a history of smoking.  A yearly low-dose CT scan of the lungs is recommended for people who:  Currently smoke.  Have quit within the past 15 years.  Have at least a 30-pack-year history of smoking. A pack year is smoking an average of one pack of cigarettes a day for 1 year.  Yearly screening should continue until it has been 15 years since you quit.  Yearly screening should stop if you develop a health problem that would prevent you from having lung cancer treatment.  Breast Cancer  Practice breast self-awareness. This means understanding how your breasts normally appear and feel.  It also means doing regular breast self-exams. Let your health care provider know about any changes, no matter how small.  If you are in your 20s or 30s, you should have a clinical breast exam (CBE) by a health care provider every 1-3 years as part of a regular health exam.  If you are 40 or older, have a CBE every year. Also consider having a breast X-ray (mammogram) every year.  If you have a family history of breast cancer, talk to your health care provider about genetic screening.  If you are at high risk for breast cancer, talk to your health care provider about having an MRI and a mammogram every year.  Breast cancer gene (BRCA) assessment is recommended for women who have family members with BRCA-related cancers. BRCA-related cancers include:  Breast.  Ovarian.  Tubal.  Peritoneal cancers.  Results of the assessment will determine  the need for genetic counseling and BRCA1 and BRCA2 testing. Cervical Cancer Your health care provider may recommend that you be screened regularly for cancer of the pelvic organs (ovaries, uterus, and vagina). This screening involves a pelvic examination, including checking for microscopic changes to the surface of your cervix (Pap test). You may be encouraged to have this screening done every 3 years, beginning at age 21.  For women ages 30-65, health care providers may recommend pelvic exams and Pap testing every 3 years, or they may recommend the Pap and pelvic exam, combined with testing for human papilloma virus (HPV), every 5 years. Some types of HPV increase your risk of cervical cancer. Testing for HPV may also be done on women of any age with unclear Pap test results.  Other health care providers may not recommend any screening for nonpregnant women who are considered low risk for pelvic cancer and who do not have symptoms. Ask your health care provider if a screening pelvic exam is right for you.  If you have had past treatment for cervical cancer or a condition that could lead to cancer, you need Pap tests and screening for cancer for at least 20 years after your treatment. If Pap tests have been discontinued, your risk factors (such as having a new sexual partner) need to be reassessed to determine if screening should resume. Some women have medical problems that increase the chance of getting cervical cancer. In these cases, your health care provider may recommend more frequent screening and Pap tests. Colorectal Cancer  This type of cancer can be detected and often prevented.  Routine colorectal cancer screening usually begins at 65 years of age and continues through 65 years of age.  Your health care provider may recommend screening at an earlier age if you have risk factors for colon cancer.  Your health care provider may also recommend using home test kits to check for hidden blood  in the stool.  A small camera at the end of a tube can be used to examine your colon directly (sigmoidoscopy or colonoscopy). This is done to check for the earliest forms of colorectal cancer.  Routine screening usually begins at age 50.  Direct examination of the colon should be repeated every 5-10 years through 65 years of age. However, you may need to be screened more often if early forms of precancerous polyps or small growths are found. Skin Cancer  Check your skin from head to toe regularly.  Tell your health care provider about any new moles or changes in moles, especially if there is a change in a mole's shape or color.  Also tell your health care provider if you have a mole that is larger than the size of a pencil eraser.  Always use sunscreen. Apply sunscreen liberally and repeatedly throughout the day.  Protect yourself by wearing long sleeves, pants, a wide-brimmed hat, and sunglasses whenever you   are outside. HEART DISEASE, DIABETES, AND HIGH BLOOD PRESSURE   High blood pressure causes heart disease and increases the risk of stroke. High blood pressure is more likely to develop in:  People who have blood pressure in the high end of the normal range (130-139/85-89 mm Hg).  People who are overweight or obese.  People who are African American.  If you are 18-39 years of age, have your blood pressure checked every 3-5 years. If you are 40 years of age or older, have your blood pressure checked every year. You should have your blood pressure measured twice--once when you are at a hospital or clinic, and once when you are not at a hospital or clinic. Record the average of the two measurements. To check your blood pressure when you are not at a hospital or clinic, you can use:  An automated blood pressure machine at a pharmacy.  A home blood pressure monitor.  If you are between 55 years and 79 years old, ask your health care provider if you should take aspirin to prevent  strokes.  Have regular diabetes screenings. This involves taking a blood sample to check your fasting blood sugar level.  If you are at a normal weight and have a low risk for diabetes, have this test once every three years after 65 years of age.  If you are overweight and have a high risk for diabetes, consider being tested at a younger age or more often. PREVENTING INFECTION  Hepatitis B  If you have a higher risk for hepatitis B, you should be screened for this virus. You are considered at high risk for hepatitis B if:  You were born in a country where hepatitis B is common. Ask your health care provider which countries are considered high risk.  Your parents were born in a high-risk country, and you have not been immunized against hepatitis B (hepatitis B vaccine).  You have HIV or AIDS.  You use needles to inject street drugs.  You live with someone who has hepatitis B.  You have had sex with someone who has hepatitis B.  You get hemodialysis treatment.  You take certain medicines for conditions, including cancer, organ transplantation, and autoimmune conditions. Hepatitis C  Blood testing is recommended for:  Everyone born from 1945 through 1965.  Anyone with known risk factors for hepatitis C. Sexually transmitted infections (STIs)  You should be screened for sexually transmitted infections (STIs) including gonorrhea and chlamydia if:  You are sexually active and are younger than 65 years of age.  You are older than 65 years of age and your health care provider tells you that you are at risk for this type of infection.  Your sexual activity has changed since you were last screened and you are at an increased risk for chlamydia or gonorrhea. Ask your health care provider if you are at risk.  If you do not have HIV, but are at risk, it may be recommended that you take a prescription medicine daily to prevent HIV infection. This is called pre-exposure prophylaxis  (PrEP). You are considered at risk if:  You are sexually active and do not regularly use condoms or know the HIV status of your partner(s).  You take drugs by injection.  You are sexually active with a partner who has HIV. Talk with your health care provider about whether you are at high risk of being infected with HIV. If you choose to begin PrEP, you should first be tested for   HIV. You should then be tested every 3 months for as long as you are taking PrEP.  PREGNANCY   If you are premenopausal and you may become pregnant, ask your health care provider about preconception counseling.  If you may become pregnant, take 400 to 800 micrograms (mcg) of folic acid every day.  If you want to prevent pregnancy, talk to your health care provider about birth control (contraception). OSTEOPOROSIS AND MENOPAUSE   Osteoporosis is a disease in which the bones lose minerals and strength with aging. This can result in serious bone fractures. Your risk for osteoporosis can be identified using a bone density scan.  If you are 74 years of age or older, or if you are at risk for osteoporosis and fractures, ask your health care provider if you should be screened.  Ask your health care provider whether you should take a calcium or vitamin D supplement to lower your risk for osteoporosis.  Menopause may have certain physical symptoms and risks.  Hormone replacement therapy may reduce some of these symptoms and risks. Talk to your health care provider about whether hormone replacement therapy is right for you.  HOME CARE INSTRUCTIONS   Schedule regular health, dental, and eye exams.  Stay current with your immunizations.   Do not use any tobacco products including cigarettes, chewing tobacco, or electronic cigarettes.  If you are pregnant, do not drink alcohol.  If you are breastfeeding, limit how much and how often you drink alcohol.  Limit alcohol intake to no more than 1 drink per day for  nonpregnant women. One drink equals 12 ounces of beer, 5 ounces of wine, or 1 ounces of hard liquor.  Do not use street drugs.  Do not share needles.  Ask your health care provider for help if you need support or information about quitting drugs.  Tell your health care provider if you often feel depressed.  Tell your health care provider if you have ever been abused or do not feel safe at home.   This information is not intended to replace advice given to you by your health care provider. Make sure you discuss any questions you have with your health care provider.   Document Released: 01/25/2011 Document Revised: 08/02/2014 Document Reviewed: 06/13/2013 Elsevier Interactive Patient Education [employer].

## 2016-04-27 NOTE — Progress Notes (Signed)
Subjective:    Patient ID: Patricia Solis, female    DOB: 1950-09-04, 65 y.o.   MRN: [id number]  HPI  Patient presents for yearly preventative medicine examination. She is a pleasant 65 year old female who  has a past medical history of Basal cell carcinoma (2007); Breast cancer ([place]); Carotid bruit (08/2008); DUB (dysfunctional uterine bleeding); Esophagitis; HTN (hypertension); Hyperlipidemia; Hypothyroidism; MHA (microangiopathic hemolytic anemia) ([place]); and Spinal stenosis.   She takes Synthroid 88 mcg for hypothyroidism   For hypertension she takes lisinopril-HCTZ 10-12.5mg   During her last visit I had her stop Simvastatin due to congnitive impairment. After stopping she reported that her symptoms has resolved. She continues to feel as though she feels better with   All immunizations and health maintenance protocols were reviewed with the patient and needed orders were placed.  Appropriate screening laboratory values were ordered for the patient including screening of hyperlipidemia, renal function and hepatic function.  Medication reconciliation,  past medical history, social history, problem list and allergies were reviewed in detail with the patient  Goals were established with regard to weight loss, exercise, and  diet in compliance with medications. She is going to the [facility] multiple times per week and is eating healthy.   She is up to date on her GYN exam and has had her vision and dental exams as well an mammogram completed for the year   Review of Systems  Constitutional: Negative.   HENT: Negative.   Eyes: Negative.   Respiratory: Negative.   Cardiovascular: Negative.   Gastrointestinal: Negative.   Endocrine: Negative.   Genitourinary: Negative.   Musculoskeletal: Negative.   Skin: Negative.   Allergic/Immunologic: Negative.   Neurological: Negative.   Hematological: Negative.   Psychiatric/Behavioral: Negative.   All other systems reviewed and are  negative.  Past Medical History:  Diagnosis Date  . Basal cell carcinoma 2007   BCC  . Breast cancer ([place])    Invasive lobular carcinoma RIGHT breast  . Carotid bruit 08/2008   Normal Doppler  . DUB (dysfunctional uterine bleeding)   . Esophagitis   . HTN (hypertension)    Tx started 06/2010  . Hyperlipidemia   . Hypothyroidism   . MHA (microangiopathic hemolytic anemia) ([facility])   . Spinal stenosis     Social History   Social History  . Marital status: Married    Spouse name: N/A  . Number of children: N/A  . Years of education: N/A   Occupational History  . Not on file.   Social History Main Topics  . Smoking status: Never Smoker  . Smokeless tobacco: Not on file  . Alcohol use No  . Drug use: No  . Sexual activity: Not on file   Other Topics Concern  . Not on file   Social History Narrative   Retired in March    Married for 25 years    One daughter, lives in [place]    Has four grandchildren      Likes to play with grand kids, go to [employer] and mountains.           Past Surgical History:  Procedure Laterality Date  . BTL    . CERVICAL LAMINECTOMY     Left  . FOOT SURGERY    . MASTECTOMY W/ NODES PARTIAL     Right  . SHOULDER ARTHROSCOPY Right   . TUBAL LIGATION  1981    Family History  Problem Relation Age of Onset  . Heart attack  Father   . Coronary artery disease Father   . Diabetes Father   . Coronary artery disease Mother   . Multiple sclerosis Sister     Twin  . Leukemia Mother   . Thyroid disease Mother     Allergies  Allergen Reactions  . Ambien Cr [Zolpidem] Other (See Comments)    Hallucinations  . Erythromycin     REACTION: rash  . Hydrocodone Other (See Comments)    Hallucinations  . Penicillins     REACTION: rash  . Clindamycin/Lincomycin Rash    Current Outpatient Prescriptions on File Prior to Visit  Medication Sig Dispense Refill  . aspirin 81 MG EC tablet Take 81 mg by mouth daily.      .  Aspirin-Salicylamide-Caffeine (BC HEADACHE POWDER PO) Take 1 packet by mouth 2 (two) times daily as needed. For migraines    . calcium carbonate (OS-CAL) 600 MG TABS Take 600 mg by mouth 2 (two) times daily with a meal.    . GLUCOSAMINE PO Take 1 tablet by mouth 2 (two) times daily.    Marland Kitchen levothyroxine (SYNTHROID) 88 MCG tablet TAKE 1 TABLET DAILY 90 tablet 3  . lisinopril-hydrochlorothiazide (PRINZIDE,ZESTORETIC) 10-12.5 MG tablet TAKE 1 BY MOUTH DAILY 90 tablet 4  . omeprazole (PRILOSEC) 20 MG capsule TAKE 1 BY MOUTH DAILY 90 capsule 4  . simvastatin (ZOCOR) 40 MG tablet TAKE 1 BY MOUTH AT BEDTIME 90 tablet 4   No current facility-administered medications on file prior to visit.     BP 128/82   Pulse 78   Wt 140 lb (63.5 kg)   BMI 25.61 kg/m       Objective:   Physical Exam  Constitutional: She is oriented to person, place, and time. She appears well-developed and well-nourished. No distress.  HENT:  Head: Normocephalic and atraumatic.  Right Ear: External ear normal.  Left Ear: External ear normal.  Nose: Nose normal.  Mouth/Throat: Oropharynx is clear and moist. No oropharyngeal exudate.  Eyes: Conjunctivae and EOM are normal. Pupils are equal, round, and reactive to light. Right eye exhibits no discharge. Left eye exhibits no discharge. No scleral icterus.  Neck: Normal range of motion and full passive range of motion without pain. Neck supple. No JVD present. Carotid bruit is not present. No tracheal deviation present. No thyroid mass and no thyromegaly present.  Cardiovascular: Normal rate, regular rhythm, normal heart sounds and intact distal pulses.  Exam reveals no gallop and no friction rub.   No murmur heard. Pulmonary/Chest: Effort normal and breath sounds normal. No stridor. No respiratory distress. She has no wheezes. She has no rales. She exhibits no tenderness.  Abdominal: Soft. Normal appearance and bowel sounds are normal. She exhibits no distension and no mass.  There is no tenderness. There is no rebound and no guarding.  Genitourinary:  Genitourinary Comments: Refused GYN and breast exam   Musculoskeletal: Normal range of motion.  Lymphadenopathy:    She has no cervical adenopathy.  Neurological: She is alert and oriented to person, place, and time. She displays normal reflexes. No cranial nerve deficit. She exhibits normal muscle tone. Coordination normal.  Skin: Skin is warm and dry. No rash noted. No erythema. No pallor.  Psychiatric: She has a normal mood and affect. Her behavior is normal. Judgment and thought content normal.  Nursing note and vitals reviewed.     Assessment & Plan:  1. Routine general medical examination at a health care facility - benign exam  - POCT Urinalysis  Dipstick (Automated) - Basic metabolic panel - Hepatic function panel - Lipid panel - TSH - Vitamin D, 25-hydroxy - Hep C Antibody - Will follow up with labs and make decisions on whether to retart statin at that point.   2. Essential hypertension - Well controlled. Will add 12.5mg  HCTZ for lower extremity swelling - POCT Urinalysis Dipstick (Automated) - Basic metabolic panel - Hepatic function panel - Lipid panel - TSH - Vitamin D, 25-hydroxy - hydrochlorothiazide (MICROZIDE) 12.5 MG capsule; Take 1 capsule (12.5 mg total) by mouth daily.  Dispense: 90 capsule; Refill: 3 - calcium carbonate (OS-CAL) 600 MG TABS tablet; Take 1 tablet (600 mg total) by mouth 2 (two) times daily with a meal.  Dispense: 180 tablet; Refill: 3 - omeprazole (PRILOSEC) 20 MG capsule; TAKE 1 BY MOUTH DAILY  Dispense: 90 capsule; Refill: 3 - lisinopril-hydrochlorothiazide (PRINZIDE,ZESTORETIC) 10-12.5 MG tablet; TAKE 1 BY MOUTH DAILY  Dispense: 90 tablet; Refill: 3  3. Hypothyroidism, unspecified type  - POCT Urinalysis Dipstick (Automated) - Basic metabolic panel - Hepatic function panel - Lipid panel - TSH - Vitamin D, 25-hydroxy  4. Medication refill  -  hydrochlorothiazide (MICROZIDE) 12.5 MG capsule; Take 1 capsule (12.5 mg total) by mouth daily.  Dispense: 90 capsule; Refill: 3 - calcium carbonate (OS-CAL) 600 MG TABS tablet; Take 1 tablet (600 mg total) by mouth 2 (two) times daily with a meal.  Dispense: 180 tablet; Refill: 3 - omeprazole (PRILOSEC) 20 MG capsule; TAKE 1 BY MOUTH DAILY  Dispense: 90 capsule; Refill: 3 - lisinopril-hydrochlorothiazide (PRINZIDE,ZESTORETIC) 10-12.5 MG tablet; TAKE 1 BY MOUTH DAILY  Dispense: 90 tablet; Refill: 3  5. Need for prophylactic vaccination against Streptococcus pneumoniae (pneumococcus)  - Pneumococcal conjugate vaccine 13-valent IM    Dorothyann Peng, NP

## 2016-04-28 ENCOUNTER — Telehealth: Payer: Self-pay | Admitting: Adult Health

## 2016-04-28 LAB — HEPATITIS C ANTIBODY: HCV AB: NEGATIVE

## 2016-04-28 NOTE — Telephone Encounter (Signed)
Spoke to the [facility] informed her of her labs, her total cholesterol and LDL cholesterol are elevated. Owing to have her restart statin and take it every other day. She can follow-up with me in 6 months for recheck or sooner if needed  She is okay with this plan

## 2016-04-29 LAB — URINE CULTURE

## 2016-05-03 ENCOUNTER — Telehealth: Payer: Self-pay | Admitting: Adult Health

## 2016-05-03 NOTE — Telephone Encounter (Signed)
Pt need new Rx for levothyroxine   Pharm:  [facility] mail [facility]  Pt would like to know if she should continue to take the lisinopril-hydrochlorothiazide or should she only be taking new Rx hydrochlorothiazide?

## 2016-05-04 ENCOUNTER — Other Ambulatory Visit: Payer: Self-pay | Admitting: Adult Health

## 2016-05-04 MED ORDER — SULFAMETHOXAZOLE-TRIMETHOPRIM 800-160 MG PO TABS: 1.0000 | ORAL_TABLET | Freq: Two times a day (BID) | ORAL | 0 refills | Status: DC

## 2016-05-04 MED ORDER — LEVOTHYROXINE SODIUM 88 MCG PO TABS: ORAL_TABLET | ORAL | 3 refills | Status: DC

## 2016-05-04 NOTE — Telephone Encounter (Signed)
Continue to take the lisinopril-HCTZ along with the new dose of HCTZ. Synthroid was sent in

## 2016-05-04 NOTE — Telephone Encounter (Signed)
Patient notified of Cory's comments - verbalized understanding.

## 2016-05-20 DIAGNOSIS — Z961 Presence of intraocular lens: Secondary | ICD-10-CM | POA: Diagnosis not present

## 2016-06-28 ENCOUNTER — Encounter: Payer: Self-pay | Admitting: Family Medicine

## 2016-06-28 ENCOUNTER — Ambulatory Visit (INDEPENDENT_AMBULATORY_CARE_PROVIDER_SITE_OTHER): Payer: PPO | Admitting: Family Medicine

## 2016-06-28 VITALS — BP 110/68 | HR 81 | Temp 98.0°F | Wt 142.8 lb

## 2016-06-28 DIAGNOSIS — J324 Chronic pansinusitis: Secondary | ICD-10-CM | POA: Diagnosis not present

## 2016-06-28 DIAGNOSIS — R059 Cough, unspecified: Secondary | ICD-10-CM

## 2016-06-28 DIAGNOSIS — R05 Cough: Secondary | ICD-10-CM | POA: Diagnosis not present

## 2016-06-28 MED ORDER — PREDNISONE 10 MG PO TABS: ORAL_TABLET | ORAL | 0 refills | Status: DC

## 2016-06-28 MED ORDER — DOXYCYCLINE HYCLATE 100 MG PO TABS: 100.0000 mg | ORAL_TABLET | Freq: Two times a day (BID) | ORAL | 0 refills | Status: DC

## 2016-06-28 MED ORDER — PROMETHAZINE-DM 6.25-15 MG/5ML PO SYRP: 5.0000 mL | ORAL_SOLUTION | Freq: Four times a day (QID) | ORAL | 0 refills | Status: DC | PRN

## 2016-06-28 NOTE — Patient Instructions (Addendum)
Please take medication as directed and follow up if symptoms do not improve in 3 to 4 days, worsen, or you develop a fever >101.    Sinusitis, Adult Sinusitis is soreness and inflammation of your sinuses. Sinuses are hollow spaces in the bones around your face. They are located:  Around your eyes.  In the middle of your forehead.  Behind your nose.  In your cheekbones. Your sinuses and nasal passages are lined with a stringy fluid (mucus). Mucus normally drains out of your sinuses. When your nasal tissues get inflamed or swollen, the mucus can get trapped or blocked so air cannot flow through your sinuses. This lets bacteria, viruses, and funguses grow, and that leads to infection. Follow these instructions at home: Medicines  Take, use, or apply over-the-counter and prescription medicines only as told by your doctor. These may include nasal sprays.  If you were prescribed an antibiotic medicine, take it as told by your doctor. Do not stop taking the antibiotic even if you start to feel better. Hydrate and Humidify  Drink enough water to keep your pee (urine) clear or pale yellow.  Use a cool mist humidifier to keep the humidity level in your home above 50%.  Breathe in steam for 10-15 minutes, 3-4 times a day or as told by your doctor. You can do this in the bathroom while a hot shower is running.  Try not to spend time in cool or dry air. Rest  Rest as much as possible.  Sleep with your head raised (elevated).  Make sure to get enough sleep each night. General instructions  Put a warm, moist washcloth on your face 3-4 times a day or as told by your doctor. This will help with discomfort.  Wash your hands often with soap and water. If there is no soap and water, use hand sanitizer.  Do not smoke. Avoid being around people who are smoking (secondhand smoke).  Keep all follow-up visits as told by your doctor. This is important. Contact a doctor if:  You have a  fever.  Your symptoms get worse.  Your symptoms do not get better within 10 days. Get help right away if:  You have a very bad headache.  You cannot stop throwing up (vomiting).  You have pain or swelling around your face or eyes.  You have trouble seeing.  You feel confused.  Your neck is stiff.  You have trouble breathing. This information is not intended to replace advice given to you by your health care provider. Make sure you discuss any questions you have with your health care provider. Document Released: 12/29/2007 Document Revised: 03/07/2016 Document Reviewed: 05/07/2015 Elsevier Interactive Patient Education  2017 [employer].

## 2016-06-28 NOTE — Progress Notes (Signed)
Pre visit review using our clinic review tool, if applicable. No additional management support is needed unless otherwise documented below in the visit note. 

## 2016-06-28 NOTE — Progress Notes (Signed)
Subjective:    Patient ID: Patricia Solis, female    DOB: 01/16/1951, 65 y.o.   MRN: [id number]  HPI  Ms. Mazzio is a 65 year old female who presents today nasal congestion and sinus pressure that has been 2 weeks.  Associated sore throat, rhinitis with green drainage, and nonproductive cough.  She denies fever, chills, sweats, ear pain, N/V/D, myalgias, and post nasal drip Treatment with Delsym, cough drops, and honey/lemon that has not provided benefit. History of bronchitis. No history of asthma.  No recent antibiotic use. She is not a smoker  Review of Systems  Constitutional: Negative for chills and fever.  HENT: Positive for congestion, rhinorrhea, sinus pressure and sore throat.   Respiratory: Positive for cough. Negative for shortness of breath and wheezing.   Cardiovascular: Negative for chest pain and palpitations.  Gastrointestinal: Negative for abdominal pain, diarrhea, nausea and vomiting.  Musculoskeletal: Negative for myalgias.   Past Medical History:  Diagnosis Date  . Basal cell carcinoma 2007   BCC  . Breast cancer ([place])    Invasive lobular carcinoma RIGHT breast  . Carotid bruit 08/2008   Normal Doppler  . DUB (dysfunctional uterine bleeding)   . Esophagitis   . HTN (hypertension)    Tx started 06/2010  . Hyperlipidemia   . Hypothyroidism   . MHA (microangiopathic hemolytic anemia) ([facility])   . Spinal stenosis      Social History   Social History  . Marital status: Married    Spouse name: N/A  . Number of children: N/A  . Years of education: N/A   Occupational History  . Not on file.   Social History Main Topics  . Smoking status: Never Smoker  . Smokeless tobacco: Not on file  . Alcohol use No  . Drug use: No  . Sexual activity: Not on file   Other Topics Concern  . Not on file   Social History Narrative   Retired in March    Married for 81 years    One daughter, lives in [place]    Has four grandchildren      Likes to play with grand kids, go  to [employer] and mountains.           Past Surgical History:  Procedure Laterality Date  . BTL    . CERVICAL LAMINECTOMY     Left  . FOOT SURGERY    . MASTECTOMY W/ NODES PARTIAL     Right  . SHOULDER ARTHROSCOPY Right   . TUBAL LIGATION  1981    Family History  Problem Relation Age of Onset  . Heart attack Father   . Coronary artery disease Father   . Diabetes Father   . Coronary artery disease Mother   . Multiple sclerosis Sister     Twin  . Leukemia Mother   . Thyroid disease Mother     Allergies  Allergen Reactions  . Ambien Cr [Zolpidem] Other (See Comments)    Hallucinations  . Erythromycin     REACTION: rash  . Hydrocodone Other (See Comments)    Hallucinations  . Penicillins     REACTION: rash  . Clindamycin/Lincomycin Rash    Current Outpatient Prescriptions on File Prior to Visit  Medication Sig Dispense Refill  . aspirin 81 MG EC tablet Take 81 mg by mouth daily.      . Aspirin-Salicylamide-Caffeine (BC HEADACHE POWDER PO) Take 1 packet by mouth 2 (two) times daily as needed. For migraines    .  calcium carbonate (OS-CAL) 600 MG TABS tablet Take 1 tablet (600 mg total) by mouth 2 (two) times daily with a meal. 180 tablet 3  . GLUCOSAMINE PO Take 1 tablet by mouth 2 (two) times daily.    . hydrochlorothiazide (MICROZIDE) 12.5 MG capsule Take 1 capsule (12.5 mg total) by mouth daily. 90 capsule 3  . levothyroxine (SYNTHROID) 88 MCG tablet TAKE 1 TABLET DAILY 90 tablet 3  . lisinopril-hydrochlorothiazide (PRINZIDE,ZESTORETIC) 10-12.5 MG tablet TAKE 1 BY MOUTH DAILY 90 tablet 3  . omeprazole (PRILOSEC) 20 MG capsule TAKE 1 BY MOUTH DAILY 90 capsule 3  . simvastatin (ZOCOR) 40 MG tablet TAKE 1 BY MOUTH AT BEDTIME 90 tablet 4  . sulfamethoxazole-trimethoprim (BACTRIM DS,SEPTRA DS) 800-160 MG tablet Take 1 tablet by mouth 2 (two) times daily. 6 tablet 0   No current facility-administered medications on file prior to visit.     BP 110/68 (BP Location: Left  Arm, Patient Position: Sitting, Cuff Size: Normal)   Pulse 81   Temp 98 F (36.7 C) (Oral)   Wt 142 lb 12.8 oz (64.8 kg)   SpO2 97%   BMI 26.12 kg/m       Objective:   Physical Exam  Constitutional: She is oriented to person, place, and time. She appears well-developed and well-nourished.  Eyes: Pupils are equal, round, and reactive to light. No scleral icterus.  Neck: Neck supple.  Cardiovascular: Normal rate and regular rhythm.   Pulmonary/Chest: Effort normal and breath sounds normal. She has no rales.  Minimal scattered wheezing present  Abdominal: Soft. Bowel sounds are normal.  Musculoskeletal: She exhibits no edema.  Lymphadenopathy:    She has cervical adenopathy.  Neurological: She is alert and oriented to person, place, and time. Coordination normal.  Skin: Skin is warm and dry. No rash noted.  Psychiatric: She has a normal mood and affect. Her behavior is normal. Judgment and thought content normal.          Assessment & Plan:  1. Pansinusitis, unspecified chronicity Duration of symptoms that are not improving support treatment for sinusitis. Advised patient to follow up if symptoms do not improve, worsen, or fever develops of  >101. - doxycycline (VIBRA-TABS) 100 MG tablet; Take 1 tablet (100 mg total) by mouth 2 (two) times daily.  Dispense: 20 tablet; Refill: 0  2. Cough Minimal wheezing noted; short course of prednisone will be provided. Vital signs WNL; - promethazine-dextromethorphan (PROMETHAZINE-DM) 6.25-15 MG/5ML syrup; Take 5 mLs by mouth 4 (four) times daily as needed for cough.  Dispense: 118 mL; Refill: 0 - predniSONE (DELTASONE) 10 MG tablet; Take4 tablets once daily for 2 days, 3 tabs daily for 2 days, 2 tabs daily for 2 days, 1 tab daily for 2 days.  Dispense: 20 tablet; Refill: 0  Follow up with Tommi Rumps if symptoms do not improve as stated above.  Delano Metz, FNP-C

## 2016-07-08 ENCOUNTER — Ambulatory Visit: Payer: PPO

## 2016-07-08 ENCOUNTER — Telehealth: Payer: Self-pay

## 2016-07-08 NOTE — Telephone Encounter (Signed)
Call to Patricia Solis This is her first year on medicare but states she did see Tommi Rumps earlier Advised to schedule between April and the end of July for AWV with [facility] for preventive overview and risk assessment and then will see the nurse for all subsequent AWV

## 2016-08-24 ENCOUNTER — Telehealth: Payer: Self-pay

## 2016-08-24 NOTE — Telephone Encounter (Signed)
That is fine.   Thanks for doing that

## 2016-08-24 NOTE — Telephone Encounter (Signed)
Ms Roederer in today with spouse and asked about why her AWV was cancelled  Educated Ms. Majer and confirmed she is within her first year on Medicare from July of last year. For the first 12 months, the doctor has to do her  "Welcome to Medicare visit." After this, we would appreciate her coming in once a year for her Wellness and will continue to see Tommi Rumps as planned  Offered "Welcome to Medicare visit" with Talbert Forest around April which is 6 months after her last visit. She has medicare and a supplement, so will not pay for CPE.  Is it ok to schedule Welcome to [facility] in April with you and her regular med check in October? (labs were drawn 04/27/2016)   Thanks Susanh

## 2016-08-30 DIAGNOSIS — Z961 Presence of intraocular lens: Secondary | ICD-10-CM | POA: Diagnosis not present

## 2016-09-27 ENCOUNTER — Other Ambulatory Visit: Payer: Self-pay | Admitting: Obstetrics and Gynecology

## 2016-09-27 DIAGNOSIS — Z1231 Encounter for screening mammogram for malignant neoplasm of breast: Secondary | ICD-10-CM

## 2016-10-25 ENCOUNTER — Ambulatory Visit
Admission: RE | Admit: 2016-10-25 | Discharge: 2016-10-25 | Disposition: A | Discharge status: Home / Self Care | Payer: PPO | Source: Ambulatory Visit | Attending: Obstetrics and Gynecology | Admitting: Obstetrics and Gynecology

## 2016-10-25 DIAGNOSIS — Z1231 Encounter for screening mammogram for malignant neoplasm of breast: Secondary | ICD-10-CM

## 2016-10-25 HISTORY — DX: Personal history of irradiation: Z92.3

## 2016-10-25 HISTORY — DX: Personal history of antineoplastic chemotherapy: Z92.21

## 2016-11-16 ENCOUNTER — Other Ambulatory Visit: Payer: Self-pay | Admitting: Adult Health

## 2016-11-16 ENCOUNTER — Telehealth: Payer: Self-pay | Admitting: Adult Health

## 2016-11-16 DIAGNOSIS — E785 Hyperlipidemia, unspecified: Secondary | ICD-10-CM

## 2016-11-16 NOTE — Telephone Encounter (Signed)
Lets recheck her lipid panel.   She should be taking it every other day

## 2016-11-16 NOTE — Telephone Encounter (Signed)
Per your phone note on 04/27/17 - you advised that patient have lab work in 6 months to recheck lipids. Is this still ok?

## 2016-11-16 NOTE — Telephone Encounter (Signed)
Pt would like to see if you would like for her to come in to recheck her Lipid Panel since you had requested her to go off of her simvastatin was going to change it but after another visit with Tommi Rumps pt state that [place] said it was okay for her to finish the simvastatin she had on hand and to give the office a call back.  Pt state that she has a few more and wanted to know what Tommi Rumps wanted her to do.

## 2016-11-16 NOTE — Telephone Encounter (Signed)
Order for lipid panel has been placed.   Patricia Solis, would you mind scheduling patient for a lab visit?

## 2016-11-16 NOTE — Telephone Encounter (Signed)
Pt scheduled  

## 2016-11-23 ENCOUNTER — Other Ambulatory Visit (INDEPENDENT_AMBULATORY_CARE_PROVIDER_SITE_OTHER): Payer: PPO

## 2016-11-23 DIAGNOSIS — E785 Hyperlipidemia, unspecified: Secondary | ICD-10-CM | POA: Diagnosis not present

## 2016-11-23 LAB — LIPID PANEL
Cholesterol: 200 mg/dL (ref 0–200)
HDL: 66.9 mg/dL (ref >39.00)
LDL Cholesterol: 121 mg/dL — ABNORMAL HIGH (ref 0–99)
NonHDL: 133.57
TRIGLYCERIDES: 65 mg/dL (ref 0.0–149.0)
Total CHOL/HDL Ratio: 3
VLDL: 13 mg/dL (ref 0.0–40.0)

## 2016-11-24 ENCOUNTER — Other Ambulatory Visit: Payer: Self-pay

## 2016-11-24 MED ORDER — SIMVASTATIN 40 MG PO TABS: ORAL_TABLET | ORAL | 4 refills | Status: DC

## 2016-11-24 MED ORDER — SIMVASTATIN 40 MG PO TABS: ORAL_TABLET | ORAL | 3 refills | Status: DC

## 2016-11-25 ENCOUNTER — Other Ambulatory Visit: Payer: Self-pay

## 2016-12-02 DIAGNOSIS — L309 Dermatitis, unspecified: Secondary | ICD-10-CM | POA: Diagnosis not present

## 2016-12-14 ENCOUNTER — Ambulatory Visit: Payer: PPO

## 2016-12-14 ENCOUNTER — Telehealth: Payer: Self-pay

## 2016-12-14 NOTE — Telephone Encounter (Signed)
Call to confirm medicare start date. It appears she started 05/2016 and if so, Patricia Solis will have to do her "Welcome to Medicare" AWV   Left VM to call the [place] office to reschedule her apt

## 2017-01-17 ENCOUNTER — Encounter: Payer: Self-pay | Admitting: Family Medicine

## 2017-01-17 ENCOUNTER — Ambulatory Visit (INDEPENDENT_AMBULATORY_CARE_PROVIDER_SITE_OTHER): Payer: PPO | Admitting: Family Medicine

## 2017-01-17 VITALS — BP 120/84 | HR 88 | Temp 98.6°F | Wt 141.4 lb

## 2017-01-17 DIAGNOSIS — R05 Cough: Secondary | ICD-10-CM | POA: Diagnosis not present

## 2017-01-17 DIAGNOSIS — R059 Cough, unspecified: Secondary | ICD-10-CM

## 2017-01-17 DIAGNOSIS — J309 Allergic rhinitis, unspecified: Secondary | ICD-10-CM

## 2017-01-17 MED ORDER — FLUTICASONE PROPIONATE 50 MCG/ACT NA SUSP: 2.0000 | Freq: Every day | NASAL | 6 refills | Status: DC

## 2017-01-17 MED ORDER — PREDNISONE 10 MG PO TABS: ORAL_TABLET | ORAL | 0 refills | Status: DC

## 2017-01-17 MED ORDER — PROMETHAZINE-DM 6.25-15 MG/5ML PO SYRP: 5.0000 mL | ORAL_SOLUTION | Freq: Four times a day (QID) | ORAL | 0 refills | Status: DC | PRN

## 2017-01-17 NOTE — Patient Instructions (Signed)
It was a pleasure to see you today. Please take medication as directed and drink plenty of water enough to keep your urine pale yellow or clear. If cough does not improve with treatment, follow up with [facility] for further evaluation.  Allergic Rhinitis Allergic rhinitis is when the mucous membranes in the nose respond to allergens. Allergens are particles in the air that cause your body to have an allergic reaction. This causes you to release allergic antibodies. Through a chain of events, these eventually cause you to release histamine into the blood stream. Although meant to protect the body, it is this release of histamine that causes your discomfort, such as frequent sneezing, congestion, and an itchy, runny nose. What are the causes? Seasonal allergic rhinitis (hay fever) is caused by pollen allergens that may come from grasses, trees, and weeds. Year-round allergic rhinitis (perennial allergic rhinitis) is caused by allergens such as house dust mites, pet dander, and mold spores. What are the signs or symptoms?  Nasal stuffiness (congestion).  Itchy, runny nose with sneezing and tearing of the eyes. How is this diagnosed? Your health care provider can help you determine the allergen or allergens that trigger your symptoms. If you and your health care provider are unable to determine the allergen, skin or blood testing may be used. Your health care provider will diagnose your condition after taking your health history and performing a physical exam. Your health care provider may assess you for other related conditions, such as asthma, pink eye, or an ear infection. How is this treated? Allergic rhinitis does not have a cure, but it can be controlled by:  Medicines that block allergy symptoms. These may include allergy shots, nasal sprays, and oral antihistamines.  Avoiding the allergen.  Hay fever may often be treated with antihistamines in pill or nasal spray forms. Antihistamines block the  effects of histamine. There are over-the-counter medicines that may help with nasal congestion and swelling around the eyes. Check with your health care provider before taking or giving this medicine. If avoiding the allergen or the medicine prescribed do not work, there are many new medicines your health care provider can prescribe. Stronger medicine may be used if initial measures are ineffective. Desensitizing injections can be used if medicine and avoidance does not work. Desensitization is when a patient is given ongoing shots until the body becomes less sensitive to the allergen. Make sure you follow up with your health care provider if problems continue. Follow these instructions at home: It is not possible to completely avoid allergens, but you can reduce your symptoms by taking steps to limit your exposure to them. It helps to know exactly what you are allergic to so that you can avoid your specific triggers. Contact a health care provider if:  You have a fever.  You develop a cough that does not stop easily (persistent).  You have shortness of breath.  You start wheezing.  Symptoms interfere with normal daily activities. This information is not intended to replace advice given to you by your health care provider. Make sure you discuss any questions you have with your health care provider. Document Released: 04/06/2001 Document Revised: 03/12/2016 Document Reviewed: 03/19/2013 Elsevier Interactive Patient Education  2017 Elsevier Inc.  Cough, Adult A cough helps to clear your throat and lungs. A cough may last only 2-3 weeks (acute), or it may last longer than 8 weeks (chronic). Many different things can cause a cough. A cough may be a sign of an illness or  another medical condition. Follow these instructions at home:  Pay attention to any changes in your cough.  Take medicines only as told by your doctor. ? If you were prescribed an antibiotic medicine, take it as told by your  doctor. Do not stop taking it even if you start to feel better. ? Talk with your doctor before you try using a cough medicine.  Drink enough fluid to keep your pee (urine) clear or pale yellow.  If the air is dry, use a cold steam vaporizer or humidifier in your home.  Stay away from things that make you cough at work or at home.  If your cough is worse at night, try using extra pillows to raise your head up higher while you sleep.  Do not smoke, and try not to be around smoke. If you need help quitting, ask your doctor.  Do not have caffeine.  Do not drink alcohol.  Rest as needed. Contact a doctor if:  You have new problems (symptoms).  You cough up yellow fluid (pus).  Your cough does not get better after 2-3 weeks, or your cough gets worse.  Medicine does not help your cough and you are not sleeping well.  You have pain that gets worse or pain that is not helped with medicine.  You have a fever.  You are losing weight and you do not know why.  You have night sweats. Get help right away if:  You cough up blood.  You have trouble breathing.  Your heartbeat is very fast. This information is not intended to replace advice given to you by your health care provider. Make sure you discuss any questions you have with your health care provider. Document Released: 03/25/2011 Document Revised: 12/18/2015 Document Reviewed: 09/18/2014 Elsevier Interactive Patient Education  [employer].

## 2017-01-17 NOTE — Progress Notes (Signed)
Subjective:    Patient ID: Patricia Solis, female    DOB: Nov 24, 1950, 66 y.o.   MRN: [id number]  HPI  Cough: Onset: New occurrence; beginning one month ago; she was evaluated since that time by a dermatologist for possible allergies and was advised to use Zyrtec at that time for other allergy concerns. She did not seek evaluation for cough at that time and cough has worsened and has not improved per patient. She denies SOB or wheezing. Cough is nonproductive and  keeps patient up at night.  She has experienced this at other times during the year and states that it takes" quite a while" for her cough to improve.  Trigger: Possible; exposure to sick grandchildren   Course: Worsened after initiation of Zyrtec and over the past month; she reports more than one grandchild has had a cough for "quite some time"  Treatment/efficacy: Zyrtec started for possible allergic rhinitis however patient states that symptoms have worsened.   Associated symptoms: Fever/Chills/Sweats: No Facial Pain: No  Frontal Headaches: No Nasal congestion/purulence: No Dental pain: No Sore throat: No Ear pain/pressure: No Itchy/watery eyes: + Itchy/watery eyes Cough: Nonproductive Pleuritic pain: No Dyspnea: No Wheezing: No  ACE Inhibitor: Yes, Long time use; cough has occurred previously while on this medication and has improved. History of asthma/bronchitis/COPD: No Recent sick contact exposure: Yes, sick grandchildren Recent antibiotic use: No GI symptoms of dyspepsia, dysphagia, or reflux: None; taking prilosec that has provided excellent benefit for reflux.   Review of Systems  Constitutional: Negative for chills and fever.  HENT: Negative for congestion, postnasal drip, rhinorrhea, sinus pain, sinus pressure, sneezing and sore throat.   Eyes: Positive for itching.       Watery eyes  Respiratory: Positive for cough. Negative for shortness of breath and wheezing.   Cardiovascular: Negative for chest  pain and palpitations.  Gastrointestinal: Negative for abdominal pain, constipation, diarrhea, nausea and vomiting.  Skin: Negative for rash.  Neurological: Negative for dizziness, light-headedness and headaches.   Past Medical History:  Diagnosis Date  . Basal cell carcinoma 2007   BCC  . Breast cancer ([place])    Invasive lobular carcinoma RIGHT breast  . Carotid bruit 08/2008   Normal Doppler  . DUB (dysfunctional uterine bleeding)   . Esophagitis   . HTN (hypertension)    Tx started 06/2010  . Hyperlipidemia   . Hypothyroidism   . MHA (microangiopathic hemolytic anemia) (HCC)   . Personal history of chemotherapy    2003  . Personal history of radiation therapy    2003 right breast  . Spinal stenosis      Social History   Social History  . Marital status: Married    Spouse name: N/A  . Number of children: N/A  . Years of education: N/A   Occupational History  . Not on file.   Social History Main Topics  . Smoking status: Never Smoker  . Smokeless tobacco: Never Used  . Alcohol use No  . Drug use: No  . Sexual activity: Not on file   Other Topics Concern  . Not on file   Social History Narrative   Retired in March    Married for 30 years    One daughter, lives in [place]    Has four grandchildren      Likes to play with grand kids, go to [employer] and mountains.           Past Surgical History:  Procedure Laterality Date  .  BREAST BIOPSY Right    2007 benign  . BREAST LUMPECTOMY Right    2003  . BTL    . CERVICAL LAMINECTOMY     Left  . FOOT SURGERY    . MASTECTOMY W/ NODES PARTIAL     Right  . SHOULDER ARTHROSCOPY Right   . TUBAL LIGATION  1981    Family History  Problem Relation Age of Onset  . Heart attack Father   . Coronary artery disease Father   . Diabetes Father   . Coronary artery disease Mother   . Leukemia Mother   . Thyroid disease Mother   . Multiple sclerosis Sister        Twin  . Breast cancer Maternal Aunt     Allergies    Allergen Reactions  . Ambien Cr [Zolpidem] Other (See Comments)    Hallucinations  . Erythromycin     REACTION: rash  . Hydrocodone Other (See Comments)    Hallucinations  . Penicillins     REACTION: rash  . Clindamycin/Lincomycin Rash    Current Outpatient Prescriptions on File Prior to Visit  Medication Sig Dispense Refill  . aspirin 81 MG EC tablet Take 81 mg by mouth daily.      . Aspirin-Salicylamide-Caffeine (BC HEADACHE POWDER PO) Take 1 packet by mouth 2 (two) times daily as needed. For migraines    . calcium carbonate (OS-CAL) 600 MG TABS tablet Take 1 tablet (600 mg total) by mouth 2 (two) times daily with a meal. 180 tablet 3  . GLUCOSAMINE PO Take 1 tablet by mouth 2 (two) times daily.    . hydrochlorothiazide (MICROZIDE) 12.5 MG capsule Take 1 capsule (12.5 mg total) by mouth daily. 90 capsule 3  . levothyroxine (SYNTHROID) 88 MCG tablet TAKE 1 TABLET DAILY 90 tablet 3  . lisinopril-hydrochlorothiazide (PRINZIDE,ZESTORETIC) 10-12.5 MG tablet TAKE 1 BY MOUTH DAILY 90 tablet 3  . omeprazole (PRILOSEC) 20 MG capsule TAKE 1 BY MOUTH DAILY 90 capsule 3  . simvastatin (ZOCOR) 40 MG tablet TAKE 1 BY MOUTH AT BEDTIME 90 tablet 3   No current facility-administered medications on file prior to visit.     BP 120/84 (BP Location: Left Arm, Patient Position: Sitting, Cuff Size: Normal)   Pulse 88   Temp 98.6 F (37 C) (Oral)   Wt 141 lb 6.4 oz (64.1 kg)   SpO2 98%   BMI 25.86 kg/m       Objective:   Physical Exam  Constitutional: She is oriented to person, place, and time. She appears well-developed and well-nourished.  Eyes: Pupils are equal, round, and reactive to light. No scleral icterus.  Neck: Neck supple.  Cardiovascular: Normal rate and regular rhythm.   Pulmonary/Chest: Effort normal. She has wheezes in the left upper field and the left middle field.  Faint wheezing  Abdominal: Soft. Bowel sounds are normal. There is no tenderness.  Musculoskeletal: She  exhibits no edema.  Lymphadenopathy:    She has no cervical adenopathy.  Neurological: She is alert and oriented to person, place, and time.  Skin: Skin is warm and dry. No rash noted.  Psychiatric: She has a normal mood and affect. Her behavior is normal. Judgment and thought content normal.       Assessment & Plan:  1. Cough New occurrence; not improving; Faint wheezing; exposure to sick grandchildren, itchy/watery eyes, pulse oximeter 98%; RR 16; suspect viral etiology with allergic rhinitis that is worsening symptoms. We discussed possible other causes of cough such as  ACE, Asthma, and acid reflux. Advised her to follow up with PCP if symptoms do not improve with treatment, worsen, or she develops a fever >100. - predniSONE (DELTASONE) 10 MG tablet; Take 4 tablets once daily for 2 days, 3 tabs daily for 2 days, 2 tabs daily for 2 days, 1 tab daily for 2 days.  Dispense: 20 tablet; Refill: 0 - promethazine-dextromethorphan (PROMETHAZINE-DM) 6.25-15 MG/5ML syrup; Take 5 mLs by mouth 4 (four) times daily as needed for cough.  Dispense: 118 mL; Refill: 0  2. Allergic rhinitis, unspecified seasonality, unspecified trigger Add flonase to Zyrtec; follow up with dermatologist as recommended.  - fluticasone (FLONASE) 50 MCG/ACT nasal spray; Place 2 sprays into both nostrils daily.  Dispense: 16 g; Refill: 6  Delano Metz, FNP-C

## 2017-01-21 ENCOUNTER — Ambulatory Visit (INDEPENDENT_AMBULATORY_CARE_PROVIDER_SITE_OTHER)
Admission: RE | Admit: 2017-01-21 | Discharge: 2017-01-21 | Disposition: A | Discharge status: Home / Self Care | Payer: PPO | Source: Ambulatory Visit | Attending: Family Medicine | Admitting: Family Medicine

## 2017-01-21 ENCOUNTER — Ambulatory Visit (INDEPENDENT_AMBULATORY_CARE_PROVIDER_SITE_OTHER): Payer: PPO | Admitting: Family Medicine

## 2017-01-21 ENCOUNTER — Encounter: Payer: Self-pay | Admitting: Family Medicine

## 2017-01-21 VITALS — BP 100/60 | HR 86 | Temp 98.4°F | Ht 62.0 in | Wt 144.6 lb

## 2017-01-21 DIAGNOSIS — K219 Gastro-esophageal reflux disease without esophagitis: Secondary | ICD-10-CM

## 2017-01-21 DIAGNOSIS — R05 Cough: Secondary | ICD-10-CM | POA: Diagnosis not present

## 2017-01-21 DIAGNOSIS — R059 Cough, unspecified: Secondary | ICD-10-CM

## 2017-01-21 DIAGNOSIS — J989 Respiratory disorder, unspecified: Secondary | ICD-10-CM

## 2017-01-21 DIAGNOSIS — J45901 Unspecified asthma with (acute) exacerbation: Secondary | ICD-10-CM | POA: Diagnosis not present

## 2017-01-21 MED ORDER — DOXYCYCLINE HYCLATE 100 MG PO TABS: 100.0000 mg | ORAL_TABLET | Freq: Two times a day (BID) | ORAL | 0 refills | Status: DC

## 2017-01-21 MED ORDER — ALBUTEROL SULFATE HFA 108 (90 BASE) MCG/ACT IN AERS: 2.0000 | INHALATION_SPRAY | Freq: Four times a day (QID) | RESPIRATORY_TRACT | 0 refills | Status: DC | PRN

## 2017-01-21 NOTE — Progress Notes (Signed)
HPI:  Acute visit for cough -x ~ 4 weeks -symptoms include hacking cough, worse at night, tickle in throat, now productive of yellow thick mucus for the last several days -denies: wheezing, SOB, DOE, CP, fevers, sinus pain, hx smoking -saw another provider here a few days ago and per notes had wheezing on exam and started prednisone taper - not helping -no cough medications help except hycodan but she reports that makes her have hallucinations -reports long hx of "dx RAD" by prior pcp and several episode of prolonged cough a few times per year on average -hx of gerd on low dose PPI, hx hypertension on lisinopril  ROS: See pertinent positives and negatives per HPI.  Past Medical History:  Diagnosis Date  . Basal cell carcinoma 2007   BCC  . Breast cancer ([place])    Invasive lobular carcinoma RIGHT breast  . Carotid bruit 08/2008   Normal Doppler  . DUB (dysfunctional uterine bleeding)   . Esophagitis   . HTN (hypertension)    Tx started 06/2010  . Hyperlipidemia   . Hypothyroidism   . MHA (microangiopathic hemolytic anemia) (HCC)   . Personal history of chemotherapy    2003  . Personal history of radiation therapy    2003 right breast  . Spinal stenosis     Past Surgical History:  Procedure Laterality Date  . BREAST BIOPSY Right    2007 benign  . BREAST LUMPECTOMY Right    2003  . BTL    . CERVICAL LAMINECTOMY     Left  . FOOT SURGERY    . MASTECTOMY W/ NODES PARTIAL     Right  . SHOULDER ARTHROSCOPY Right   . TUBAL LIGATION  1981    Family History  Problem Relation Age of Onset  . Heart attack Father   . Coronary artery disease Father   . Diabetes Father   . Coronary artery disease Mother   . Leukemia Mother   . Thyroid disease Mother   . Multiple sclerosis Sister        Twin  . Breast cancer Maternal Aunt     Social History   Social History  . Marital status: Married    Spouse name: N/A  . Number of children: N/A  . Years of education: N/A    Social History Main Topics  . Smoking status: Never Smoker  . Smokeless tobacco: Never Used  . Alcohol use No  . Drug use: No  . Sexual activity: Not Asked   Other Topics Concern  . None   Social History Narrative   Retired in March    Married for 49 years    One daughter, lives in [place]    Has four grandchildren      Likes to play with grand kids, go to [employer] and mountains.            Current Outpatient Prescriptions:  .  aspirin 81 MG EC tablet, Take 81 mg by mouth daily.  , Disp: , Rfl:  .  Aspirin-Salicylamide-Caffeine (BC HEADACHE POWDER PO), Take 1 packet by mouth 2 (two) times daily as needed. For migraines, Disp: , Rfl:  .  calcium carbonate (OS-CAL) 600 MG TABS tablet, Take 1 tablet (600 mg total) by mouth 2 (two) times daily with a meal., Disp: 180 tablet, Rfl: 3 .  fluticasone (FLONASE) 50 MCG/ACT nasal spray, Place 2 sprays into both nostrils daily., Disp: 16 g, Rfl: 6 .  GLUCOSAMINE PO, Take 1 tablet by  mouth 2 (two) times daily., Disp: , Rfl:  .  hydrochlorothiazide (MICROZIDE) 12.5 MG capsule, Take 1 capsule (12.5 mg total) by mouth daily., Disp: 90 capsule, Rfl: 3 .  levothyroxine (SYNTHROID) 88 MCG tablet, TAKE 1 TABLET DAILY, Disp: 90 tablet, Rfl: 3 .  lisinopril-hydrochlorothiazide (PRINZIDE,ZESTORETIC) 10-12.5 MG tablet, TAKE 1 BY MOUTH DAILY, Disp: 90 tablet, Rfl: 3 .  omeprazole (PRILOSEC) 20 MG capsule, TAKE 1 BY MOUTH DAILY, Disp: 90 capsule, Rfl: 3 .  predniSONE (DELTASONE) 10 MG tablet, Take 4 tablets once daily for 2 days, 3 tabs daily for 2 days, 2 tabs daily for 2 days, 1 tab daily for 2 days., Disp: 20 tablet, Rfl: 0 .  promethazine-dextromethorphan (PROMETHAZINE-DM) 6.25-15 MG/5ML syrup, Take 5 mLs by mouth 4 (four) times daily as needed for cough., Disp: 118 mL, Rfl: 0 .  simvastatin (ZOCOR) 40 MG tablet, TAKE 1 BY MOUTH AT BEDTIME, Disp: 90 tablet, Rfl: 3 .  albuterol (PROVENTIL HFA;VENTOLIN HFA) 108 (90 Base) MCG/ACT inhaler, Inhale 2 puffs  into the lungs every 6 (six) hours as needed., Disp: 1 Inhaler, Rfl: 0 .  doxycycline (VIBRA-TABS) 100 MG tablet, Take 1 tablet (100 mg total) by mouth 2 (two) times daily., Disp: 14 tablet, Rfl: 0  EXAM:  Vitals:   01/21/17 1417  BP: 100/60  Pulse: 86  Temp: 98.4 F (36.9 C)    Body mass index is 26.45 kg/m.  GENERAL: vitals reviewed and listed above, alert, oriented, appears well hydrated and in no acute distress  HEENT: atraumatic, conjunttiva clear, no obvious abnormalities on inspection of external nose and ears, normal appearance of ear canals and TMs, clear nasal congestion, mild post oropharyngeal erythema with PND, no tonsillar edema or exudate, no sinus TTP  NECK: no obvious masses on inspection  LUNGS: clear to auscultation bilaterally, no wheezes, rales or rhonchi, good air movement  CV: HRRR, no peripheral edema  MS: moves all extremities without noticeable abnormality  PSYCH: pleasant and cooperative, no obvious depression or anxiety  ASSESSMENT AND PLAN:  Discussed the following assessment and plan:  Cough  Respiratory illness  Reactive airway disease with acute exacerbation, unspecified asthma severity, unspecified whether persistent  Gastroesophageal reflux disease, esophagitis presence not specified  -we discussed possible and likely etiologies, workup and treatment, treatment risks and return precautions; given discolored mucus production and duration concern for bacterial resp infection in the setting of ? RAD -opted to treat with doxy for good sinus and lung coverage, continue prednisone course, prn alb and get CXR -also agree with prior notes that she may need eval for uncontrolled GERD or acei adverse effect if not improving on abx -after this discussion, Athalee opted for abx, consideration CXR, alb prn, finish out prednisone and close follow up -follow up advised in 1-2 weeks -of course, we advised Keelee  to return or notify a doctor immediately  if symptoms worsen, do not respond to tx or new concerns arise.      Patient Instructions  BEFORE YOU LEAVE: -xray sheet -follow up: 1-2 weeks with PCP  Start the antibiotic.  Finish the prednisone per instructions.  Try the albuterol as we discussed to see if this helps.  Get the CXR - particularly if not improving.  I hope you are feeling better soon! Seek care sooner if worsening, new concerns or you are not improving with treatment.      Colin Benton R., DO

## 2017-01-21 NOTE — Patient Instructions (Signed)
BEFORE YOU LEAVE: -xray sheet -follow up: 1-2 weeks with PCP  Start the antibiotic.  Finish the prednisone per instructions.  Try the albuterol as we discussed to see if this helps.  Get the CXR - particularly if not improving.  I hope you are feeling better soon! Seek care sooner if worsening, new concerns or you are not improving with treatment.

## 2017-01-21 NOTE — Addendum Note (Signed)
Addended by: Agnes Lawrence on: 01/21/2017 03:29 PM   Modules accepted: Orders

## 2017-01-28 ENCOUNTER — Encounter: Payer: Self-pay | Admitting: Adult Health

## 2017-01-28 ENCOUNTER — Ambulatory Visit (INDEPENDENT_AMBULATORY_CARE_PROVIDER_SITE_OTHER): Payer: PPO | Admitting: Adult Health

## 2017-01-28 VITALS — BP 124/62 | Temp 98.3°F | Ht 62.0 in | Wt 142.2 lb

## 2017-01-28 DIAGNOSIS — R059 Cough, unspecified: Secondary | ICD-10-CM

## 2017-01-28 DIAGNOSIS — R05 Cough: Secondary | ICD-10-CM

## 2017-01-28 DIAGNOSIS — I10 Essential (primary) hypertension: Secondary | ICD-10-CM | POA: Diagnosis not present

## 2017-01-28 MED ORDER — PREDNISONE 10 MG PO TABS: ORAL_TABLET | ORAL | 0 refills | Status: DC

## 2017-01-28 MED ORDER — LOSARTAN POTASSIUM 25 MG PO TABS: 25.0000 mg | ORAL_TABLET | Freq: Every day | ORAL | 3 refills | Status: DC

## 2017-01-28 NOTE — Progress Notes (Signed)
Subjective:    Patient ID: Patricia Solis, female    DOB: 1951-01-13, 66 y.o.   MRN: [id number]  HPI  66 year old female who  has a past medical history of Basal cell carcinoma (2007); Breast cancer ([place]); Carotid bruit (08/2008); DUB (dysfunctional uterine bleeding); Esophagitis; HTN (hypertension); Hyperlipidemia; Hypothyroidism; MHA (microangiopathic hemolytic anemia) ([place]); Personal history of chemotherapy; Personal history of radiation therapy; and Spinal stenosis.   She was seen by Almira Coaster on 01/17/2017 and prescribed a prednisone course as well as cough syrup. 4 days later she was seen by Dr. Maudie Mercury and was prescribed an antibiotic and inhaler for RAD with acute exacerbation.   Today in the office she reports that she is feeling improved but she continues to have a dry cough. She has one more day of antibiotic therapy.   She denies any fevers, n/v/d or feeling ill.   She is concerned that it may be her blood pressure medication that is causing her to have this dry cough. She would like to try switching up medications   Review of Systems See HPI   Past Medical History:  Diagnosis Date  . Basal cell carcinoma 2007   BCC  . Breast cancer ([place])    Invasive lobular carcinoma RIGHT breast  . Carotid bruit 08/2008   Normal Doppler  . DUB (dysfunctional uterine bleeding)   . Esophagitis   . HTN (hypertension)    Tx started 06/2010  . Hyperlipidemia   . Hypothyroidism   . MHA (microangiopathic hemolytic anemia) ([facility])   . Personal history of chemotherapy    2003  . Personal history of radiation therapy    2003 right breast  . Spinal stenosis     Social History   Social History  . Marital status: Married    Spouse name: N/A  . Number of children: N/A  . Years of education: N/A   Occupational History  . Not on file.   Social History Main Topics  . Smoking status: Never Smoker  . Smokeless tobacco: Never Used  . Alcohol use No  . Drug use: No  . Sexual activity:  Not on file   Other Topics Concern  . Not on file   Social History Narrative   Retired in March    Married for 67 years    One daughter, lives in [place]    Has four grandchildren      Likes to play with grand kids, go to [employer] and mountains.           Past Surgical History:  Procedure Laterality Date  . BREAST BIOPSY Right    2007 benign  . BREAST LUMPECTOMY Right    2003  . BTL    . CERVICAL LAMINECTOMY     Left  . FOOT SURGERY    . MASTECTOMY W/ NODES PARTIAL     Right  . SHOULDER ARTHROSCOPY Right   . TUBAL LIGATION  1981    Family History  Problem Relation Age of Onset  . Heart attack Father   . Coronary artery disease Father   . Diabetes Father   . Coronary artery disease Mother   . Leukemia Mother   . Thyroid disease Mother   . Multiple sclerosis Sister        Twin  . Breast cancer Maternal Aunt     Allergies  Allergen Reactions  . Ambien Cr [Zolpidem] Other (See Comments)    Hallucinations  . Erythromycin  REACTION: rash  . Hydrocodone Other (See Comments)    Hallucinations  . Penicillins     REACTION: rash  . Clindamycin/Lincomycin Rash    Current Outpatient Prescriptions on File Prior to Visit  Medication Sig Dispense Refill  . albuterol (PROVENTIL HFA;VENTOLIN HFA) 108 (90 Base) MCG/ACT inhaler Inhale 2 puffs into the lungs every 6 (six) hours as needed. 1 Inhaler 0  . aspirin 81 MG EC tablet Take 81 mg by mouth daily.      . Aspirin-Salicylamide-Caffeine (BC HEADACHE POWDER PO) Take 1 packet by mouth 2 (two) times daily as needed. For migraines    . calcium carbonate (OS-CAL) 600 MG TABS tablet Take 1 tablet (600 mg total) by mouth 2 (two) times daily with a meal. 180 tablet 3  . doxycycline (VIBRA-TABS) 100 MG tablet Take 1 tablet (100 mg total) by mouth 2 (two) times daily. 14 tablet 0  . GLUCOSAMINE PO Take 1 tablet by mouth 2 (two) times daily.    . hydrochlorothiazide (MICROZIDE) 12.5 MG capsule Take 1 capsule (12.5 mg total) by  mouth daily. 90 capsule 3  . levothyroxine (SYNTHROID) 88 MCG tablet TAKE 1 TABLET DAILY 90 tablet 3  . omeprazole (PRILOSEC) 20 MG capsule TAKE 1 BY MOUTH DAILY 90 capsule 3  . promethazine-dextromethorphan (PROMETHAZINE-DM) 6.25-15 MG/5ML syrup Take 5 mLs by mouth 4 (four) times daily as needed for cough. 118 mL 0  . simvastatin (ZOCOR) 40 MG tablet TAKE 1 BY MOUTH AT BEDTIME 90 tablet 3   No current facility-administered medications on file prior to visit.     BP 124/62 (BP Location: Left Arm, Patient Position: Sitting, Cuff Size: Normal)   Temp 98.3 F (36.8 C) (Oral)   Ht 5\' 2"  (1.575 m)   Wt 142 lb 3.2 oz (64.5 kg)   BMI 26.01 kg/m       Objective:   Physical Exam  Constitutional: She is oriented to person, place, and time. She appears well-developed and well-nourished. No distress.  Cardiovascular: Normal rate, regular rhythm, normal heart sounds and intact distal pulses.  Exam reveals no gallop.   No murmur heard. Pulmonary/Chest: Effort normal and breath sounds normal. No respiratory distress. She has no wheezes. She has no rales. She exhibits no tenderness.  Neurological: She is alert and oriented to person, place, and time.  Skin: Skin is warm and dry. No rash noted. She is not diaphoretic. No erythema. No pallor.  Psychiatric: She has a normal mood and affect. Her behavior is normal. Judgment and thought content normal.  Vitals reviewed.     Assessment & Plan:  1. Cough - Will try switching blood pressure medication. D/c Lisinopril-HCTZ and start Cozaar.  - Will prescribe another taper of prednisone; if she needs it.  - predniSONE (DELTASONE) 10 MG tablet; 40 mg x 3 days, 20 mg x 3 days, 10 mg x 3 days  Dispense: 21 tablet; Refill: 0 - Follow up via Mychart in one week  2. Essential hypertension - d/c lisinopril and start Cozaar  - losartan (COZAAR) 25 MG tablet; Take 1 tablet (25 mg total) by mouth daily.  Dispense: 30 tablet; Refill: 3   Dorothyann Peng, NP

## 2017-01-28 NOTE — Patient Instructions (Signed)
I have discontinued Lisinopril and prescribed you Cozaar to see if the lisinopril is causing you to cough   Please send me a email via [facility] in one week to let me know how you are doing

## 2017-01-31 DIAGNOSIS — Z01419 Encounter for gynecological examination (general) (routine) without abnormal findings: Secondary | ICD-10-CM | POA: Diagnosis not present

## 2017-01-31 DIAGNOSIS — Z6826 Body mass index (BMI) 26.0-26.9, adult: Secondary | ICD-10-CM | POA: Diagnosis not present

## 2017-02-09 ENCOUNTER — Encounter: Payer: Self-pay | Admitting: Adult Health

## 2017-02-09 DIAGNOSIS — I10 Essential (primary) hypertension: Secondary | ICD-10-CM

## 2017-02-10 MED ORDER — LOSARTAN POTASSIUM 25 MG PO TABS: 25.0000 mg | ORAL_TABLET | Freq: Every day | ORAL | 1 refills | Status: DC

## 2017-02-21 ENCOUNTER — Encounter: Payer: Self-pay | Admitting: Adult Health

## 2017-03-22 DIAGNOSIS — M5136 Other intervertebral disc degeneration, lumbar region: Secondary | ICD-10-CM | POA: Diagnosis not present

## 2017-04-15 ENCOUNTER — Encounter: Payer: Self-pay | Admitting: Adult Health

## 2017-04-24 ENCOUNTER — Encounter: Payer: Self-pay | Admitting: Adult Health

## 2017-04-28 ENCOUNTER — Encounter: Payer: Self-pay | Admitting: Adult Health

## 2017-04-28 ENCOUNTER — Ambulatory Visit (INDEPENDENT_AMBULATORY_CARE_PROVIDER_SITE_OTHER): Payer: PPO | Admitting: Adult Health

## 2017-04-28 VITALS — BP 110/70 | HR 76 | Temp 98.5°F | Ht 62.0 in | Wt 143.0 lb

## 2017-04-28 DIAGNOSIS — E782 Mixed hyperlipidemia: Secondary | ICD-10-CM

## 2017-04-28 DIAGNOSIS — Z23 Encounter for immunization: Secondary | ICD-10-CM

## 2017-04-28 DIAGNOSIS — Z0000 Encounter for general adult medical examination without abnormal findings: Secondary | ICD-10-CM

## 2017-04-28 DIAGNOSIS — I10 Essential (primary) hypertension: Secondary | ICD-10-CM | POA: Diagnosis not present

## 2017-04-28 DIAGNOSIS — E039 Hypothyroidism, unspecified: Secondary | ICD-10-CM | POA: Diagnosis not present

## 2017-04-28 LAB — HEPATIC FUNCTION PANEL
ALT: 15 U/L (ref 0–35)
AST: 11 U/L (ref 0–37)
Albumin: 4.2 g/dL (ref 3.5–5.2)
Alkaline Phosphatase: 65 U/L (ref 39–117)
BILIRUBIN DIRECT: 0.1 mg/dL (ref 0.0–0.3)
BILIRUBIN TOTAL: 0.4 mg/dL (ref 0.2–1.2)
Total Protein: 6.8 g/dL (ref 6.0–8.3)

## 2017-04-28 LAB — CBC WITH DIFFERENTIAL/PLATELET
BASOS PCT: 0.8 % (ref 0.0–3.0)
Basophils Absolute: 0 10*3/uL (ref 0.0–0.1)
EOS ABS: 0.2 10*3/uL (ref 0.0–0.7)
Eosinophils Relative: 3 % (ref 0.0–5.0)
HCT: 42.1 % (ref 36.0–46.0)
HEMOGLOBIN: 13.9 g/dL (ref 12.0–15.0)
LYMPHS ABS: 1.5 10*3/uL (ref 0.7–4.0)
Lymphocytes Relative: 28.6 % (ref 12.0–46.0)
MCHC: 33 g/dL (ref 30.0–36.0)
MCV: 92 fl (ref 78.0–100.0)
MONO ABS: 0.4 10*3/uL (ref 0.1–1.0)
Monocytes Relative: 7.9 % (ref 3.0–12.0)
NEUTROS PCT: 59.7 % (ref 43.0–77.0)
Neutro Abs: 3.2 10*3/uL (ref 1.4–7.7)
PLATELETS: 315 10*3/uL (ref 150.0–400.0)
RBC: 4.58 Mil/uL (ref 3.87–5.11)
RDW: 14.4 % (ref 11.5–15.5)
WBC: 5.3 10*3/uL (ref 4.0–10.5)

## 2017-04-28 LAB — LIPID PANEL
CHOL/HDL RATIO: 3
CHOLESTEROL: 207 mg/dL — AB (ref 0–200)
HDL: 62.6 mg/dL (ref >39.00)
LDL CALC: 133 mg/dL — AB (ref 0–99)
NonHDL: 144.71
TRIGLYCERIDES: 60 mg/dL (ref 0.0–149.0)
VLDL: 12 mg/dL (ref 0.0–40.0)

## 2017-04-28 LAB — TSH: TSH: 0.49 u[IU]/mL (ref 0.35–4.50)

## 2017-04-28 LAB — BASIC METABOLIC PANEL
BUN: 21 mg/dL (ref 6–23)
CHLORIDE: 103 meq/L (ref 96–112)
CO2: 31 mEq/L (ref 19–32)
CREATININE: 0.59 mg/dL (ref 0.40–1.20)
Calcium: 9.8 mg/dL (ref 8.4–10.5)
GFR: 108.33 mL/min (ref >60.00)
GLUCOSE: 92 mg/dL (ref 70–99)
Potassium: 4.1 mEq/L (ref 3.5–5.1)
SODIUM: 142 meq/L (ref 135–145)

## 2017-04-28 NOTE — Progress Notes (Signed)
Subjective:    Patient ID: Patricia Solis, female    DOB: 07/17/1951, 66 y.o.   MRN: [id number]  HPI  Patient presents for yearly preventative medicine examination. She is a pleasant 66 year old female who  has a past medical history of Basal cell carcinoma (2007); Breast cancer ([place]); Carotid bruit (08/2008); DUB (dysfunctional uterine bleeding); Esophagitis; HTN (hypertension); Hyperlipidemia; Hypothyroidism; MHA (microangiopathic hemolytic anemia) ([place]); Personal history of chemotherapy; Personal history of radiation therapy; and Spinal stenosis.   She takes HCTZ 12.5 mg and Cozaar 25 mg for hypertension - this is controlled   She takes Synthroid 88 mcg for hypothyroidism   Due to history of hyperlipidemia she takes Zocor 40 mg   All immunizations and health maintenance protocols were reviewed with the patient and needed orders were placed. She is due for her flu vaccination and PPSV 23   Appropriate screening laboratory values were ordered for the patient including screening of hyperlipidemia, renal function and hepatic function.  Medication reconciliation,  past medical history, social history, problem list and allergies were reviewed in detail with the patient  Goals were established with regard to weight loss, exercise, and  diet in compliance with medications. She exercises on a regular basis and eats healthy   End of life planning was discussed.She has an advanced directive and living will  She is up to date on mammograms and GYN exams. She reports having two colonoscopies ( were not with [facility]), her last being in her 60's .She reports a "normal" exam   She has no acute complaints today   Review of Systems  Constitutional: Negative.   HENT: Negative.   Eyes: Negative.   Respiratory: Negative.   Cardiovascular: Negative.   Gastrointestinal: Negative.   Endocrine: Negative.   Genitourinary: Negative.   Musculoskeletal: Negative.   Allergic/Immunologic: Negative.     Neurological: Negative.   Hematological: Negative.   All other systems reviewed and are negative.  Past Medical History:  Diagnosis Date  . Basal cell carcinoma 2007   BCC  . Breast cancer ([place])    Invasive lobular carcinoma RIGHT breast  . Carotid bruit 08/2008   Normal Doppler  . DUB (dysfunctional uterine bleeding)   . Esophagitis   . HTN (hypertension)    Tx started 06/2010  . Hyperlipidemia   . Hypothyroidism   . MHA (microangiopathic hemolytic anemia) ([facility])   . Personal history of chemotherapy    2003  . Personal history of radiation therapy    2003 right breast  . Spinal stenosis     Social History   Social History  . Marital status: Married    Spouse name: N/A  . Number of children: N/A  . Years of education: N/A   Occupational History  . Not on file.   Social History Main Topics  . Smoking status: Never Smoker  . Smokeless tobacco: Never Used  . Alcohol use No  . Drug use: No  . Sexual activity: Not on file   Other Topics Concern  . Not on file   Social History Narrative   Retired in March    Married for 56 years    One daughter, lives in [place]    Has four grandchildren      Likes to play with grand kids, go to [employer] and mountains.           Past Surgical History:  Procedure Laterality Date  . BREAST BIOPSY Right    2007 benign  .  BREAST LUMPECTOMY Right    2003  . BTL    . CERVICAL LAMINECTOMY     Left  . FOOT SURGERY    . MASTECTOMY W/ NODES PARTIAL     Right  . SHOULDER ARTHROSCOPY Right   . TUBAL LIGATION  1981    Family History  Problem Relation Age of Onset  . Heart attack Father   . Coronary artery disease Father   . Diabetes Father   . Coronary artery disease Mother   . Leukemia Mother   . Thyroid disease Mother   . Multiple sclerosis Sister        Twin  . Breast cancer Maternal Aunt     Allergies  Allergen Reactions  . Ambien Cr [Zolpidem] Other (See Comments)    Hallucinations  . Erythromycin      REACTION: rash  . Hydrocodone Other (See Comments)    Hallucinations  . Lisinopril Cough  . Penicillins     REACTION: rash  . Clindamycin/Lincomycin Rash    Current Outpatient Prescriptions on File Prior to Visit  Medication Sig Dispense Refill  . albuterol (PROVENTIL HFA;VENTOLIN HFA) 108 (90 Base) MCG/ACT inhaler Inhale 2 puffs into the lungs every 6 (six) hours as needed. 1 Inhaler 0  . aspirin 81 MG EC tablet Take 81 mg by mouth daily.      . Aspirin-Salicylamide-Caffeine (BC HEADACHE POWDER PO) Take 1 packet by mouth 2 (two) times daily as needed. For migraines    . calcium carbonate (OS-CAL) 600 MG TABS tablet Take 1 tablet (600 mg total) by mouth 2 (two) times daily with a meal. 180 tablet 3  . doxycycline (VIBRA-TABS) 100 MG tablet Take 1 tablet (100 mg total) by mouth 2 (two) times daily. 14 tablet 0  . GLUCOSAMINE PO Take 1 tablet by mouth 2 (two) times daily.    . hydrochlorothiazide (MICROZIDE) 12.5 MG capsule Take 1 capsule (12.5 mg total) by mouth daily. 90 capsule 3  . levothyroxine (SYNTHROID) 88 MCG tablet TAKE 1 TABLET DAILY 90 tablet 3  . losartan (COZAAR) 25 MG tablet Take 1 tablet (25 mg total) by mouth daily. 90 tablet 1  . omeprazole (PRILOSEC) 20 MG capsule TAKE 1 BY MOUTH DAILY 90 capsule 3  . predniSONE (DELTASONE) 10 MG tablet 40 mg x 3 days, 20 mg x 3 days, 10 mg x 3 days 21 tablet 0  . promethazine-dextromethorphan (PROMETHAZINE-DM) 6.25-15 MG/5ML syrup Take 5 mLs by mouth 4 (four) times daily as needed for cough. 118 mL 0  . simvastatin (ZOCOR) 40 MG tablet TAKE 1 BY MOUTH AT BEDTIME 90 tablet 3   No current facility-administered medications on file prior to visit.     There were no vitals taken for this visit.      Objective:   Physical Exam  Constitutional: She is oriented to person, place, and time. She appears well-developed and well-nourished. No distress.  HENT:  Head: Normocephalic and atraumatic.  Right Ear: External ear normal.  Left Ear:  External ear normal.  Nose: Nose normal.  Mouth/Throat: Oropharynx is clear and moist. No oropharyngeal exudate.  Eyes: Pupils are equal, round, and reactive to light. Conjunctivae and EOM are normal. Right eye exhibits no discharge. Left eye exhibits no discharge.  Neck: Normal range of motion. Neck supple. No JVD present. No tracheal deviation present. No thyromegaly present.  Cardiovascular: Normal rate, regular rhythm, normal heart sounds and intact distal pulses.  Exam reveals no gallop and no friction rub.  No murmur heard. Pulmonary/Chest: Effort normal and breath sounds normal. No stridor. No respiratory distress. She has no wheezes. She has no rales. She exhibits no tenderness.  Abdominal: Soft. Bowel sounds are normal. She exhibits no distension and no mass. There is no tenderness. There is no rebound and no guarding.  Musculoskeletal: Normal range of motion. She exhibits no edema, tenderness or deformity.  Lymphadenopathy:    She has no cervical adenopathy.  Neurological: She is alert and oriented to person, place, and time. She has normal reflexes. No cranial nerve deficit. Coordination normal.  Skin: Skin is warm and dry. No rash noted. She is not diaphoretic. No erythema. No pallor.  Psychiatric: She has a normal mood and affect. Her behavior is normal. Judgment and thought content normal.  Nursing note and vitals reviewed.     Assessment & Plan:  1. Routine general medical examination at a health care facility - Continue to eat healthy and exercise  - Follow up in one year or sooner if needed - Basic metabolic panel - CBC with Differential/Platelet - Hepatic function panel - Lipid panel - TSH  2. Essential hypertension - Well controlled. No change in medications - Basic metabolic panel - CBC with Differential/Platelet - Hepatic function panel - Lipid panel - TSH  3. Hypothyroidism, unspecified type - Consider synthroid change  - Basic metabolic panel - CBC with  Differential/Platelet - Hepatic function panel - Lipid panel - TSH  4. Mixed hyperlipidemia - Consider statin dose change  - Basic metabolic panel - CBC with Differential/Platelet - Hepatic function panel - Lipid panel - TSH  5. Need for prophylactic vaccination and inoculation against influenza  - Flu vaccine HIGH DOSE PF (Fluzone High dose)  6. Need for 23-polyvalent pneumococcal polysaccharide vaccine  - Pneumococcal polysaccharide vaccine 23-valent greater than or equal to 2yo subcutaneous/IM  Dorothyann Peng, NP

## 2017-04-28 NOTE — Progress Notes (Signed)
I have reviewed and agree with this plan  

## 2017-04-28 NOTE — Patient Instructions (Addendum)
It was great seeing you today   Your exam was great   I will follow up with you regarding your blood work   Please continue to eat healthy and exercise    Patricia Solis , Thank you for taking time to come for your Medicare Wellness Visit. I appreciate your ongoing commitment to your health goals. Please review the following plan we discussed and let me know if I can assist you in the future.   Continue doing brain stimulating activities (puzzles, reading, adult coloring books, staying active) to keep memory sharp.  Does word search   Has had one one shingrix and to get the next one in December   Prevention of falls: Remove rugs or any tripping hazards in the home Use Non slip mats in bathtubs and showers Placing grab bars next to the toilet and or shower Placing handrails on both sides of the stair way Adding extra lighting in the home.   Personal safety issues reviewed:  1. Consider starting a community watch program per [facility] 2.  Changes batteries is smoke detector and/or carbon monoxide detector  3.  If you have firearms; keep them in a safe place 4.  Wear protection when in the sun; Always wear sunscreen or a hat; It is good to have your doctor check your skin annually or review any new areas of concern 5. Driving safety; Keep in the right lane; stay 3 car lengths behind the car in front of you on the highway; look 3 times prior to pulling out; carry your cell phone everywhere you go!    Learn about the Yellow Dot program:  The program allows first responders at your emergency to have access to who your physician is, as well as your medications and medical conditions.  Citizens requesting the Yellow Dot Packages should contact Master Corporal Nunzio Cobbs at the [facility] [phone] for the first week of the program and beginning the week after Easter citizens should contact their Scientist, physiological.     These are the goals  we discussed: Goals    . Exercise 150 minutes per week (moderate activity)          Will start going to the gym by yourself in Jan  Gets up early!       This is a list of the screening recommended for you and due dates:  Health Maintenance  Topic Date Due  . Colon Cancer Screening  02/23/2001  . Complete foot exam   05/25/2018*  . Hemoglobin A1C  05/25/2018*  . Eye exam for diabetics  05/25/2018*  . Mammogram  10/26/2018  . Tetanus Vaccine  09/20/2021  . Flu Shot  Completed  . DEXA scan (bone density measurement)  Completed  .  Hepatitis C: One time screening is recommended by Center for Disease Control  (CDC) for  adults born from [birth year] through 1965.   Completed  . Pneumonia vaccines  Completed  *Topic was postponed. The date shown is not the original due date.    Health Maintenance for Postmenopausal Women Menopause is a normal process in which your reproductive ability comes to an end. This process happens gradually over a span of months to years, usually between the ages of 38 and 81. Menopause is complete when you have missed 12 consecutive menstrual periods. It is important to talk with your health care provider about some of the most common conditions that affect postmenopausal women, such as heart disease, cancer,  and bone loss (osteoporosis). Adopting a healthy lifestyle and getting preventive care can help to promote your health and wellness. Those actions can also lower your chances of developing some of these common conditions. What should I know about menopause? During menopause, you may experience a number of symptoms, such as:  Moderate-to-severe hot flashes.  Night sweats.  Decrease in sex drive.  Mood swings.  Headaches.  Tiredness.  Irritability.  Memory problems.  Insomnia.  Choosing to treat or not to treat menopausal changes is an individual decision that you make with your health care provider. What should I know about hormone replacement therapy  and supplements? Hormone therapy products are effective for treating symptoms that are associated with menopause, such as hot flashes and night sweats. Hormone replacement carries certain risks, especially as you become older. If you are thinking about using estrogen or estrogen with progestin treatments, discuss the benefits and risks with your health care provider. What should I know about heart disease and stroke? Heart disease, heart attack, and stroke become more likely as you age. This may be due, in part, to the hormonal changes that your body experiences during menopause. These can affect how your body processes dietary fats, triglycerides, and cholesterol. Heart attack and stroke are both medical emergencies. There are many things that you can do to help prevent heart disease and stroke:  Have your blood pressure checked at least every 1-2 years. High blood pressure causes heart disease and increases the risk of stroke.  If you are 37-58 years old, ask your health care provider if you should take aspirin to prevent a heart attack or a stroke.  Do not use any tobacco products, including cigarettes, chewing tobacco, or electronic cigarettes. If you need help quitting, ask your health care provider.  It is important to eat a healthy diet and maintain a healthy weight. ? Be sure to include plenty of vegetables, fruits, low-fat dairy products, and lean protein. ? Avoid eating foods that are high in solid fats, added sugars, or salt (sodium).  Get regular exercise. This is one of the most important things that you can do for your health. ? Try to exercise for at least 150 minutes each week. The type of exercise that you do should increase your heart rate and make you sweat. This is known as moderate-intensity exercise. ? Try to do strengthening exercises at least twice each week. Do these in addition to the moderate-intensity exercise.  Know your numbers.Ask your health care provider to check  your cholesterol and your blood glucose. Continue to have your blood tested as directed by your health care provider.  What should I know about cancer screening? There are several types of cancer. Take the following steps to reduce your risk and to catch any cancer development as early as possible. Breast Cancer  Practice breast self-awareness. ? This means understanding how your breasts normally appear and feel. ? It also means doing regular breast self-exams. Let your health care provider know about any changes, no matter how small.  If you are 9 or older, have a clinician do a breast exam (clinical breast exam or CBE) every year. Depending on your age, family history, and medical history, it may be recommended that you also have a yearly breast X-ray (mammogram).  If you have a family history of breast cancer, talk with your health care provider about genetic screening.  If you are at high risk for breast cancer, talk with your health care provider about  having an MRI and a mammogram every year.  Breast cancer (BRCA) gene test is recommended for women who have family members with BRCA-related cancers. Results of the assessment will determine the need for genetic counseling and BRCA1 and for BRCA2 testing. BRCA-related cancers include these types: ? Breast. This occurs in males or females. ? Ovarian. ? Tubal. This may also be called fallopian tube cancer. ? Cancer of the abdominal or pelvic lining (peritoneal cancer). ? Prostate. ? Pancreatic.  Cervical, Uterine, and Ovarian Cancer Your health care provider may recommend that you be screened regularly for cancer of the pelvic organs. These include your ovaries, uterus, and vagina. This screening involves a pelvic exam, which includes checking for microscopic changes to the surface of your cervix (Pap test).  For women ages 21-65, health care providers may recommend a pelvic exam and a Pap test every three years. For women ages 30-65,  they may recommend the Pap test and pelvic exam, combined with testing for human papilloma virus (HPV), every five years. Some types of HPV increase your risk of cervical cancer. Testing for HPV may also be done on women of any age who have unclear Pap test results.  Other health care providers may not recommend any screening for nonpregnant women who are considered low risk for pelvic cancer and have no symptoms. Ask your health care provider if a screening pelvic exam is right for you.  If you have had past treatment for cervical cancer or a condition that could lead to cancer, you need Pap tests and screening for cancer for at least 20 years after your treatment. If Pap tests have been discontinued for you, your risk factors (such as having a new sexual partner) need to be reassessed to determine if you should start having screenings again. Some women have medical problems that increase the chance of getting cervical cancer. In these cases, your health care provider may recommend that you have screening and Pap tests more often.  If you have a family history of uterine cancer or ovarian cancer, talk with your health care provider about genetic screening.  If you have vaginal bleeding after reaching menopause, tell your health care provider.  There are currently no reliable tests available to screen for ovarian cancer.  Lung Cancer Lung cancer screening is recommended for adults 55-80 years old who are at high risk for lung cancer because of a history of smoking. A yearly low-dose CT scan of the lungs is recommended if you:  Currently smoke.  Have a history of at least 30 pack-years of smoking and you currently smoke or have quit within the past 15 years. A pack-year is smoking an average of one pack of cigarettes per day for one year.  Yearly screening should:  Continue until it has been 15 years since you quit.  Stop if you develop a health problem that would prevent you from having lung  cancer treatment.  Colorectal Cancer  This type of cancer can be detected and can often be prevented.  Routine colorectal cancer screening usually begins at age 50 and continues through age 75.  If you have risk factors for colon cancer, your health care provider may recommend that you be screened at an earlier age.  If you have a family history of colorectal cancer, talk with your health care provider about genetic screening.  Your health care provider may also recommend using home test kits to check for hidden blood in your stool.  A small camera at   the end of a tube can be used to examine your colon directly (sigmoidoscopy or colonoscopy). This is done to check for the earliest forms of colorectal cancer.  Direct examination of the colon should be repeated every 5-10 years until age 75. However, if early forms of precancerous polyps or small growths are found or if you have a family history or genetic risk for colorectal cancer, you may need to be screened more often.  Skin Cancer  Check your skin from head to toe regularly.  Monitor any moles. Be sure to tell your health care provider: ? About any new moles or changes in moles, especially if there is a change in a mole's shape or color. ? If you have a mole that is larger than the size of a pencil eraser.  If any of your family members has a history of skin cancer, especially at a young age, talk with your health care provider about genetic screening.  Always use sunscreen. Apply sunscreen liberally and repeatedly throughout the day.  Whenever you are outside, protect yourself by wearing long sleeves, pants, a wide-brimmed hat, and sunglasses.  What should I know about osteoporosis? Osteoporosis is a condition in which bone destruction happens more quickly than new bone creation. After menopause, you may be at an increased risk for osteoporosis. To help prevent osteoporosis or the bone fractures that can happen because of  osteoporosis, the following is recommended:  If you are 19-50 years old, get at least 1,000 mg of calcium and at least 600 mg of vitamin D per day.  If you are older than age 50 but younger than age 70, get at least 1,200 mg of calcium and at least 600 mg of vitamin D per day.  If you are older than age 70, get at least 1,200 mg of calcium and at least 800 mg of vitamin D per day.  Smoking and excessive alcohol intake increase the risk of osteoporosis. Eat foods that are rich in calcium and vitamin D, and do weight-bearing exercises several times each week as directed by your health care provider. What should I know about how menopause affects my mental health? Depression may occur at any age, but it is more common as you become older. Common symptoms of depression include:  Low or sad mood.  Changes in sleep patterns.  Changes in appetite or eating patterns.  Feeling an overall lack of motivation or enjoyment of activities that you previously enjoyed.  Frequent crying spells.  Talk with your health care provider if you think that you are experiencing depression. What should I know about immunizations? It is important that you get and maintain your immunizations. These include:  Tetanus, diphtheria, and pertussis (Tdap) booster vaccine.  Influenza every year before the flu season begins.  Pneumonia vaccine.  Shingles vaccine.  Your health care provider may also recommend other immunizations. This information is not intended to replace advice given to you by your health care provider. Make sure you discuss any questions you have with your health care provider. Document Released: 09/03/2005 Document Revised: 01/30/2016 Document Reviewed: 04/15/2015 Elsevier Interactive Patient Education  2018 [employer]    Fall Prevention in the Home Falls can cause injuries and can affect people from all age groups. There are many simple things that you can do to make your home safe and  to help prevent falls. What can I do on the outside of my home?  Regularly repair the edges of walkways and driveways and fix   any cracks.  Remove high doorway thresholds.  Trim any shrubbery on the main path into your home.  Use bright outdoor lighting.  Clear walkways of debris and clutter, including tools and rocks.  Regularly check that handrails are securely fastened and in good repair. Both sides of any steps should have handrails.  Install guardrails along the edges of any raised decks or porches.  Have leaves, snow, and ice cleared regularly.  Use sand or salt on walkways during winter months.  In the garage, clean up any spills right away, including grease or oil spills. What can I do in the bathroom?  Use night lights.  Install grab bars by the toilet and in the tub and shower. Do not use towel bars as grab bars.  Use non-skid mats or decals on the floor of the tub or shower.  If you need to sit down while you are in the shower, use a plastic, non-slip stool.  Keep the floor dry. Immediately clean up any water that spills on the floor.  Remove soap buildup in the tub or shower on a regular basis.  Attach bath mats securely with double-sided non-slip rug tape.  Remove throw rugs and other tripping hazards from the floor. What can I do in the bedroom?  Use night lights.  Make sure that a bedside light is easy to reach.  Do not use oversized bedding that drapes onto the floor.  Have a firm chair that has side arms to use for getting dressed.  Remove throw rugs and other tripping hazards from the floor. What can I do in the kitchen?  Clean up any spills right away.  Avoid walking on wet floors.  Place frequently used items in easy-to-reach places.  If you need to reach for something above you, use a sturdy step stool that has a grab bar.  Keep electrical cables out of the way.  Do not use floor polish or wax that makes floors slippery. If you have to  use wax, make sure that it is non-skid floor wax.  Remove throw rugs and other tripping hazards from the floor. What can I do in the stairways?  Do not leave any items on the stairs.  Make sure that there are handrails on both sides of the stairs. Fix handrails that are broken or loose. Make sure that handrails are as long as the stairways.  Check any carpeting to make sure that it is firmly attached to the stairs. Fix any carpet that is loose or worn.  Avoid having throw rugs at the top or bottom of stairways, or secure the rugs with carpet tape to prevent them from moving.  Make sure that you have a light switch at the top of the stairs and the bottom of the stairs. If you do not have them, have them installed. What are some other fall prevention tips?  Wear closed-toe shoes that fit well and support your feet. Wear shoes that have rubber soles or low heels.  When you use a stepladder, make sure that it is completely opened and that the sides are firmly locked. Have someone hold the ladder while you are using it. Do not climb a closed stepladder.  Add color or contrast paint or tape to grab bars and handrails in your home. Place contrasting color strips on the first and last steps.  Use mobility aids as needed, such as canes, walkers, scooters, and crutches.  Turn on lights if it is dark. Replace any light   bulbs that burn out.  Set up furniture so that there are clear paths. Keep the furniture in the same spot.  Fix any uneven floor surfaces.  Choose a carpet design that does not hide the edge of steps of a stairway.  Be aware of any and all pets.  Review your medicines with your healthcare provider. Some medicines can cause dizziness or changes in blood pressure, which increase your risk of falling. Talk with your health care provider about other ways that you can decrease your risk of falls. This may include working with a physical therapist or trainer to improve your strength,  balance, and endurance. This information is not intended to replace advice given to you by your health care provider. Make sure you discuss any questions you have with your health care provider. Document Released: 07/02/2002 Document Revised: 12/09/2015 Document Reviewed: 08/16/2014 Elsevier Interactive Patient Education  2017 [employer].

## 2017-04-28 NOTE — Progress Notes (Signed)
Subjective:   Patricia Solis is a 66 y.o. female who presents for an Initial Medicare Annual Wellness Visit.  The Patient was informed that the wellness visit is to identify future health risk and educate and initiate measures that can reduce risk for increased disease through the lifespan.    Annual Wellness Assessment  Reports health as good Layed off from work due to [employer] products   Preventive Screening -Counseling & Management  Medicare Annual Preventive Care Visit - Subsequent Last OV today   Colonoscopy not sure where;  Dr. Oletta Lamas; [facility]; Phone: [phone] Call to [facility] She had last colonoscopy 08/2012; due again 10 year   Had 1st one at 71  mamogram every year in April at [facility] 01/2009 -1.1 - Dr. Helane Rima - 67 for women She has had dexa since then; qo year Skipped this year but GYN is following    VS reviewed;   Diet  Trying to eat a lot of salads;  Grilled chicken breast Trying to cut back on sweets  Recommended having sweets out and enjoy but not place them in the home   BMI 26   Ex[facility] Tai chi; weights and walking  Now goes with spouse but in Jan will go on her own 2 miles a day   Dental - ok   Stressors: son in Sports coach to [employer] They have 4 children and miss them  14 grands    Sleep patterns: no     Cardiac Risk Factors Addressed No chest pain or other Brother and mother had MI   Advanced Directives completed   Patient Care Team: Dorothyann Peng, NP as PCP - General (Family Medicine)  Cardiac Risk Factors include: advanced age (>75mn, >>107women);dyslipidemia;hypertension     Objective:    Today's Vitals   04/28/17 0811  BP: 110/70  Pulse: 76  Temp: 98.5 F (36.9 C)  TempSrc: Oral  SpO2: 98%  Weight: 143 lb (64.9 kg)  Height: 5' 2"  (1.575 m)   Body mass index is 26.16 kg/m.   Current Medications (verified) Outpatient  Encounter Prescriptions as of 04/28/2017  Medication Sig  . aspirin 81 MG EC tablet Take 81 mg by mouth daily.    . calcium carbonate (OS-CAL) 600 MG TABS tablet Take 1 tablet (600 mg total) by mouth 2 (two) times daily with a meal.  . GLUCOSAMINE PO Take 1 tablet by mouth 2 (two) times daily.  . hydrochlorothiazide (MICROZIDE) 12.5 MG capsule Take 1 capsule (12.5 mg total) by mouth daily.  .Marland Kitchenlevothyroxine (SYNTHROID) 88 MCG tablet TAKE 1 TABLET DAILY  . losartan (COZAAR) 25 MG tablet Take 1 tablet (25 mg total) by mouth daily.  .Marland Kitchenomeprazole (PRILOSEC) 20 MG capsule TAKE 1 BY MOUTH DAILY  . simvastatin (ZOCOR) 40 MG tablet TAKE 1 BY MOUTH AT BEDTIME  . [DISCONTINUED] albuterol (PROVENTIL HFA;VENTOLIN HFA) 108 (90 Base) MCG/ACT inhaler Inhale 2 puffs into the lungs every 6 (six) hours as needed.  . [DISCONTINUED] Aspirin-Salicylamide-Caffeine (BC HEADACHE POWDER PO) Take 1 packet by mouth 2 (two) times daily as needed. For migraines  . [DISCONTINUED] doxycycline (VIBRA-TABS) 100 MG tablet Take 1 tablet (100 mg total) by mouth 2 (two) times daily.  . [DISCONTINUED] predniSONE (DELTASONE) 10 MG tablet 40 mg x 3 days, 20 mg x 3 days, 10 mg x 3 days  . [DISCONTINUED] promethazine-dextromethorphan (PROMETHAZINE-DM) 6.25-15 MG/5ML syrup Take 5 mLs by  mouth 4 (four) times daily as needed for cough.   No facility-administered encounter medications on file as of 04/28/2017.     Allergies (verified) Ambien cr [zolpidem]; Erythromycin; Hydrocodone; Lisinopril; Penicillins; and Clindamycin/lincomycin   History: Past Medical History:  Diagnosis Date  . Basal cell carcinoma 2007   BCC  . Breast cancer ([place])    Invasive lobular carcinoma RIGHT breast  . Carotid bruit 08/2008   Normal Doppler  . DUB (dysfunctional uterine bleeding)   . Esophagitis   . HTN (hypertension)    Tx started 06/2010  . Hyperlipidemia   . Hypothyroidism   . MHA (microangiopathic hemolytic anemia) (HCC)   . Personal history  of chemotherapy    2003  . Personal history of radiation therapy    2003 right breast  . Spinal stenosis    Past Surgical History:  Procedure Laterality Date  . BREAST BIOPSY Right    2007 benign  . BREAST LUMPECTOMY Right    2003  . BTL    . CATARACT EXTRACTION, BILATERAL Bilateral 03/26/2016   still blurred vision; Md states this is fine  . CERVICAL LAMINECTOMY     Left  . FOOT SURGERY    . MASTECTOMY W/ NODES PARTIAL     Right  . SHOULDER ARTHROSCOPY Right   . TUBAL LIGATION  1981   Family History  Problem Relation Age of Onset  . Heart attack Father   . Coronary artery disease Father   . Diabetes Father   . Coronary artery disease Mother   . Leukemia Mother   . Thyroid disease Mother   . Multiple sclerosis Sister        Twin  . Breast cancer Maternal Aunt    Social History   Occupational History  . Not on file.   Social History Main Topics  . Smoking status: Never Smoker  . Smokeless tobacco: Never Used  . Alcohol use No  . Drug use: No  . Sexual activity: Not on file    Tobacco Counseling Counseling given: Yes   Activities of Daily Living In your present state of health, do you have any difficulty performing the following activities: 04/28/2017  Hearing? N  Vision? N  Difficulty concentrating or making decisions? N  Walking or climbing stairs? N  Dressing or bathing? N  Doing errands, shopping? N  Preparing Food and eating ? N  Using the Toilet? N  In the past six months, have you accidently leaked urine? N  Do you have problems with loss of bowel control? N  Managing your Medications? N  Managing your Finances? N  Housekeeping or managing your Housekeeping? N  Some recent data might be hidden    Immunizations and Health Maintenance Immunization History  Administered Date(s) Administered  . Influenza Whole 07/26/2005, 04/25/2009  . Influenza, High Dose Seasonal PF 04/27/2016, 04/28/2017  . Pneumococcal Conjugate-13 04/27/2016  .  Pneumococcal Polysaccharide-23 04/28/2017  . Td 07/26/2001  . Tdap 09/21/2011  . Zoster 07/11/2012  . Zoster Recombinat (Shingrix) 04/20/2017   There are no preventive care reminders to display for this patient.  Patient Care Team: Dorothyann Peng, NP as PCP - General (Family Medicine)  Indicate any recent Medical Services you may have received from other than [facility] providers in the past year (date may be approximate).     Assessment:   This is a routine wellness examination for [place].   Hearing/Vision screen Hearing Screening Comments: Hearing issues Has not had her hearing checked Medicare does pay  for a free hearing screen  Vision Screening Comments: Vision  Every year Dr. Gershon Crane Cataracts - lens   Dietary issues and exercise activities discussed: Current Exercise Habits: Structured exercise class, Type of exercise: strength training/weights, Time (Minutes): > 60, Frequency (Times/Week): 3, Weekly Exercise (Minutes/Week): 0, Intensity: Moderate  Goals    . Exercise 150 minutes per week (moderate activity)          Will start going to the gym by yourself in Jan  Gets up early!      Depression Screen PHQ 2/9 Scores 04/28/2017 04/28/2017 06/28/2016  PHQ - 2 Score 0 0 0    Fall Risk Fall Risk  04/28/2017 04/28/2017 06/28/2016  Falls in the past year? No No No    Cognitive Function: Ad8 score reviewed for issues:  Issues making decisions:  Less interest in hobbies / activities:  Repeats questions, stories (family complaining):  Trouble using ordinary gadgets (microwave, computer, phone):  Forgets the month or year:   Mismanaging finances:   Remembering appts:  Daily problems with thinking and/or memory: Ad8 score is=0 No alz in her family  States she can't remember while at work  [employer] med; was stopped   MMSE - [place] Exam 04/28/2017  Not completed: (No Data)       ad8 score 0 Some remote memory issues when working may be related to  cholesterol med at the time  Screening Tests Health Maintenance  Topic Date Due  . FOOT EXAM  05/25/2018 (Originally 02/23/1961)  . HEMOGLOBIN A1C  05/25/2018 (Originally [birth year])  . OPHTHALMOLOGY EXAM  05/25/2018 (Originally 02/23/1961)  . MAMMOGRAM  10/26/2018  . TETANUS/TDAP  09/20/2021  . COLONOSCOPY  08/26/2022  . INFLUENZA VACCINE  Completed  . DEXA SCAN  Completed  . Hepatitis C Screening  Completed  . PNA vac Low Risk Adult  Completed      Plan:     PCP Notes   Health Maintenance Colonoscopy completed in 2014 and due in 10 years per Dr. Oletta Lamas office at [place]. Updated metrics  Mammogram and dexa (osteopenia) followed by GYN at [facility]  Has taken the first shingrix vaccination   Abnormal Screens  none  Referrals  None   Patient concerns; None  Nurse Concerns;  Next PCP apt seen today     I have personally reviewed and noted the following in the patient's chart:   . Medical and social history . Use of alcohol, tobacco or illicit drugs  . Current medications and supplements . Functional ability and status . Nutritional status . Physical activity . Advanced directives . List of other physicians . Hospitalizations, surgeries, and ER visits in previous 12 months . Vitals . Screenings to include cognitive, depression, and falls . Referrals and appointments  In addition, I have reviewed and discussed with patient certain preventive protocols, quality metrics, and best practice recommendations. A written personalized care plan for preventive services as well as general preventive health recommendations were provided to patient.     FHQRF,XJOIT, RN   04/28/2017

## 2017-04-28 NOTE — Addendum Note (Signed)
Addended byWynetta Fines on: 04/28/2017 09:35 AM   Modules accepted: Miquel Dunn

## 2017-05-02 ENCOUNTER — Encounter: Payer: Self-pay | Admitting: Adult Health

## 2017-05-02 DIAGNOSIS — I10 Essential (primary) hypertension: Secondary | ICD-10-CM

## 2017-05-02 DIAGNOSIS — Z760 Encounter for issue of repeat prescription: Secondary | ICD-10-CM

## 2017-05-02 MED ORDER — OMEPRAZOLE 20 MG PO CPDR: DELAYED_RELEASE_CAPSULE | ORAL | 3 refills | Status: DC

## 2017-05-02 MED ORDER — LEVOTHYROXINE SODIUM 88 MCG PO TABS: ORAL_TABLET | ORAL | 3 refills | Status: DC

## 2017-05-02 MED ORDER — HYDROCHLOROTHIAZIDE 12.5 MG PO CAPS: 12.5000 mg | ORAL_CAPSULE | Freq: Every day | ORAL | 3 refills | Status: DC

## 2017-05-27 DIAGNOSIS — M5136 Other intervertebral disc degeneration, lumbar region: Secondary | ICD-10-CM | POA: Diagnosis not present

## 2017-06-21 DIAGNOSIS — M25522 Pain in left elbow: Secondary | ICD-10-CM | POA: Diagnosis not present

## 2017-06-21 DIAGNOSIS — M5136 Other intervertebral disc degeneration, lumbar region: Secondary | ICD-10-CM | POA: Diagnosis not present

## 2017-07-28 ENCOUNTER — Encounter: Payer: Self-pay | Admitting: Adult Health

## 2017-07-28 DIAGNOSIS — I10 Essential (primary) hypertension: Secondary | ICD-10-CM

## 2017-07-29 MED ORDER — LOSARTAN POTASSIUM 25 MG PO TABS: 25.0000 mg | ORAL_TABLET | Freq: Every day | ORAL | 2 refills | Status: DC

## 2017-08-01 DIAGNOSIS — H10503 Unspecified blepharoconjunctivitis, bilateral: Secondary | ICD-10-CM | POA: Diagnosis not present

## 2017-08-17 DIAGNOSIS — M62830 Muscle spasm of back: Secondary | ICD-10-CM | POA: Diagnosis not present

## 2017-08-17 DIAGNOSIS — M25522 Pain in left elbow: Secondary | ICD-10-CM | POA: Diagnosis not present

## 2017-08-23 ENCOUNTER — Other Ambulatory Visit: Payer: Self-pay | Admitting: Chiropractic Medicine

## 2017-08-23 DIAGNOSIS — R5381 Other malaise: Secondary | ICD-10-CM

## 2017-09-01 DIAGNOSIS — H52203 Unspecified astigmatism, bilateral: Secondary | ICD-10-CM | POA: Diagnosis not present

## 2017-09-01 DIAGNOSIS — H524 Presbyopia: Secondary | ICD-10-CM | POA: Diagnosis not present

## 2017-09-01 DIAGNOSIS — Z961 Presence of intraocular lens: Secondary | ICD-10-CM | POA: Diagnosis not present

## 2017-09-01 DIAGNOSIS — H04123 Dry eye syndrome of bilateral lacrimal glands: Secondary | ICD-10-CM | POA: Diagnosis not present

## 2017-09-05 ENCOUNTER — Other Ambulatory Visit: Payer: Self-pay | Admitting: Chiropractic Medicine

## 2017-09-05 DIAGNOSIS — M25522 Pain in left elbow: Secondary | ICD-10-CM

## 2017-09-08 ENCOUNTER — Ambulatory Visit
Admission: RE | Admit: 2017-09-08 | Discharge: 2017-09-08 | Disposition: A | Discharge status: Home / Self Care | Payer: PPO | Source: Ambulatory Visit | Attending: Chiropractic Medicine | Admitting: Chiropractic Medicine

## 2017-09-08 DIAGNOSIS — M25522 Pain in left elbow: Secondary | ICD-10-CM

## 2017-09-08 DIAGNOSIS — M7989 Other specified soft tissue disorders: Secondary | ICD-10-CM | POA: Diagnosis not present

## 2017-09-15 DIAGNOSIS — Z23 Encounter for immunization: Secondary | ICD-10-CM | POA: Diagnosis not present

## 2017-09-15 DIAGNOSIS — Z85828 Personal history of other malignant neoplasm of skin: Secondary | ICD-10-CM | POA: Diagnosis not present

## 2017-09-15 DIAGNOSIS — L821 Other seborrheic keratosis: Secondary | ICD-10-CM | POA: Diagnosis not present

## 2017-09-15 DIAGNOSIS — L820 Inflamed seborrheic keratosis: Secondary | ICD-10-CM | POA: Diagnosis not present

## 2017-09-15 DIAGNOSIS — L309 Dermatitis, unspecified: Secondary | ICD-10-CM | POA: Diagnosis not present

## 2017-09-15 DIAGNOSIS — D485 Neoplasm of uncertain behavior of skin: Secondary | ICD-10-CM | POA: Diagnosis not present

## 2017-09-23 ENCOUNTER — Other Ambulatory Visit: Payer: Self-pay | Admitting: Obstetrics and Gynecology

## 2017-09-23 DIAGNOSIS — Z139 Encounter for screening, unspecified: Secondary | ICD-10-CM

## 2017-09-27 ENCOUNTER — Other Ambulatory Visit: Payer: Self-pay | Admitting: Obstetrics and Gynecology

## 2017-09-27 DIAGNOSIS — M8580 Other specified disorders of bone density and structure, unspecified site: Secondary | ICD-10-CM

## 2017-10-11 ENCOUNTER — Ambulatory Visit (INDEPENDENT_AMBULATORY_CARE_PROVIDER_SITE_OTHER): Payer: PPO | Admitting: Adult Health

## 2017-10-11 ENCOUNTER — Encounter: Payer: Self-pay | Admitting: Adult Health

## 2017-10-11 VITALS — BP 118/66 | Temp 98.4°F | Wt 145.0 lb

## 2017-10-11 DIAGNOSIS — G8929 Other chronic pain: Secondary | ICD-10-CM | POA: Diagnosis not present

## 2017-10-11 NOTE — Progress Notes (Signed)
Subjective:    Patient ID: Patricia Solis, female    DOB: 1950-07-28, 67 y.o.   MRN: [id number]  HPI  67 year old female who  has a past medical history of Basal cell carcinoma (2007), Breast cancer ([place]), Carotid bruit (08/2008), DUB (dysfunctional uterine bleeding), Esophagitis, HTN (hypertension), Hyperlipidemia, Hypothyroidism, MHA (microangiopathic hemolytic anemia) ([place]), Personal history of chemotherapy, Personal history of radiation therapy, and Spinal stenosis. She presents to the office today for the chronic complaint of right hip pain, left arm and neck pain. She has been seen by orthopedics for both, has received injections and done massage therapy. Despite this she reports that the pain is constant. She is unable to exercise as she would like.   She reports having [place] for left arm and neck which " was normal"  Review of Systems See HPI   Past Medical History:  Diagnosis Date  . Basal cell carcinoma 2007   BCC  . Breast cancer ([place])    Invasive lobular carcinoma RIGHT breast  . Carotid bruit 08/2008   Normal Doppler  . DUB (dysfunctional uterine bleeding)   . Esophagitis   . HTN (hypertension)    Tx started 06/2010  . Hyperlipidemia   . Hypothyroidism   . MHA (microangiopathic hemolytic anemia) (HCC)   . Personal history of chemotherapy    2003  . Personal history of radiation therapy    2003 right breast  . Spinal stenosis     Social History   Socioeconomic History  . Marital status: Married    Spouse name: Not on file  . Number of children: Not on file  . Years of education: Not on file  . Highest education level: Not on file  Social Needs  . Financial resource strain: Not on file  . Food insecurity - worry: Not on file  . Food insecurity - inability: Not on file  . Transportation needs - medical: Not on file  . Transportation needs - non-medical: Not on file  Occupational History  . Not on file  Tobacco Use  . Smoking status: Never Smoker  . Smokeless  tobacco: Never Used  Substance and Sexual Activity  . Alcohol use: No  . Drug use: No  . Sexual activity: Not on file  Other Topics Concern  . Not on file  Social History Narrative   Retired in March    Married for 67 years    One daughter, lives in [place]    Has four grandchildren      Likes to play with grand kids, go to [employer] and mountains.        Past Surgical History:  Procedure Laterality Date  . BREAST BIOPSY Right    2007 benign  . BREAST LUMPECTOMY Right    2003  . BTL    . CATARACT EXTRACTION, BILATERAL Bilateral 03/26/2016   still blurred vision; Md states this is fine  . CERVICAL LAMINECTOMY     Left  . FOOT SURGERY    . MASTECTOMY W/ NODES PARTIAL     Right  . SHOULDER ARTHROSCOPY Right   . TUBAL LIGATION  1981    Family History  Problem Relation Age of Onset  . Heart attack Father   . Coronary artery disease Father   . Diabetes Father   . Coronary artery disease Mother   . Leukemia Mother   . Thyroid disease Mother   . Multiple sclerosis Sister        Twin  .  Breast cancer Maternal Aunt     Allergies  Allergen Reactions  . Ambien Cr [Zolpidem] Other (See Comments)    Hallucinations  . Erythromycin     REACTION: rash  . Hydrocodone Other (See Comments)    Hallucinations  . Lisinopril Cough  . Penicillins     REACTION: rash  . Clindamycin/Lincomycin Rash    Current Outpatient Medications on File Prior to Visit  Medication Sig Dispense Refill  . aspirin 81 MG EC tablet Take 81 mg by mouth daily.      . calcium carbonate (OS-CAL) 600 MG TABS tablet Take 1 tablet (600 mg total) by mouth 2 (two) times daily with a meal. 180 tablet 3  . GLUCOSAMINE PO Take 1 tablet by mouth 2 (two) times daily.    . hydrochlorothiazide (MICROZIDE) 12.5 MG capsule Take 1 capsule (12.5 mg total) by mouth daily. 90 capsule 3  . levothyroxine (SYNTHROID) 88 MCG tablet TAKE 1 TABLET DAILY 90 tablet 3  . losartan (COZAAR) 25 MG tablet Take 1 tablet (25 mg total)  by mouth daily. 90 tablet 2  . omeprazole (PRILOSEC) 20 MG capsule TAKE 1 BY MOUTH DAILY 90 capsule 3  . Polyethyl Glycol-Propyl Glycol (SYSTANE OP) Apply to eye. PLACE 2 DROPS INTO EACH EYE AS NEEDED    . simvastatin (ZOCOR) 40 MG tablet TAKE 1 BY MOUTH AT BEDTIME 90 tablet 3   No current facility-administered medications on file prior to visit.     BP 118/66 (BP Location: Left Arm)   Temp 98.4 F (36.9 C) (Oral)   Wt 145 lb (65.8 kg)   BMI 26.52 kg/m       Objective:   Physical Exam  Constitutional: She is oriented to person, place, and time. She appears well-developed and well-nourished. No distress.  Cardiovascular: Normal rate, regular rhythm, normal heart sounds and intact distal pulses. Exam reveals no gallop and no friction rub.  No murmur heard. Pulmonary/Chest: Effort normal and breath sounds normal. No respiratory distress. She has no wheezes. She has no rales. She exhibits no tenderness.  Musculoskeletal: Normal range of motion. She exhibits tenderness. She exhibits no edema.  Decreased ROM in left arm   Neurological: She is alert and oriented to person, place, and time.  Skin: Skin is warm and dry. No rash noted. She is not diaphoretic. No erythema. No pallor.  Psychiatric: She has a normal mood and affect. Her behavior is normal. Judgment and thought content normal.  Nursing note and vitals reviewed.     Assessment & Plan:  1. Other chronic pain - Reviewed options including Mobic, TENS unit, medicated pads, acupuncture, etc.  - She will think about the options and let me know   Dorothyann Peng, NP

## 2017-10-19 ENCOUNTER — Encounter: Payer: Self-pay | Admitting: Adult Health

## 2017-10-26 ENCOUNTER — Ambulatory Visit
Admission: RE | Admit: 2017-10-26 | Discharge: 2017-10-26 | Disposition: A | Discharge status: Home / Self Care | Payer: PPO | Source: Ambulatory Visit | Attending: Obstetrics and Gynecology | Admitting: Obstetrics and Gynecology

## 2017-10-26 DIAGNOSIS — M8589 Other specified disorders of bone density and structure, multiple sites: Secondary | ICD-10-CM | POA: Diagnosis not present

## 2017-10-26 DIAGNOSIS — M8580 Other specified disorders of bone density and structure, unspecified site: Secondary | ICD-10-CM

## 2017-10-26 DIAGNOSIS — Z139 Encounter for screening, unspecified: Secondary | ICD-10-CM

## 2017-10-26 DIAGNOSIS — Z1231 Encounter for screening mammogram for malignant neoplasm of breast: Secondary | ICD-10-CM | POA: Diagnosis not present

## 2017-10-26 DIAGNOSIS — Z780 Asymptomatic menopausal state: Secondary | ICD-10-CM | POA: Diagnosis not present

## 2017-11-01 ENCOUNTER — Ambulatory Visit (INDEPENDENT_AMBULATORY_CARE_PROVIDER_SITE_OTHER): Payer: PPO | Admitting: Adult Health

## 2017-11-01 ENCOUNTER — Encounter: Payer: Self-pay | Admitting: Adult Health

## 2017-11-01 VITALS — BP 130/62 | Temp 98.4°F | Wt 147.0 lb

## 2017-11-01 DIAGNOSIS — R600 Localized edema: Secondary | ICD-10-CM | POA: Diagnosis not present

## 2017-11-01 NOTE — Progress Notes (Addendum)
Subjective:    Patient ID: Patricia Solis, female    DOB: [birth year], 67 y.o.   MRN: [id number]  HPI  67 year old female who  has a past medical history of Basal cell carcinoma (2007), Breast cancer ([place]), Carotid bruit (08/2008), DUB (dysfunctional uterine bleeding), Esophagitis, HTN (hypertension), Hyperlipidemia, Hypothyroidism, MHA (microangiopathic hemolytic anemia) ([place]), Personal history of chemotherapy, Personal history of radiation therapy, and Spinal stenosis.  She presents to the clinic today today for bilateral lower extremity edema. She feels as though her feet have been becoming more swollen over the last 2-3 weeks. She reports swelling is worse at the end of the day. She has not been elevating her legs and eats out most meals of the week. She does drink a lot of water.   Does not have any CP or SOB   Review of Systems See HPI   Past Medical History:  Diagnosis Date  . Basal cell carcinoma 2007   BCC  . Breast cancer ([place])    Invasive lobular carcinoma RIGHT breast  . Carotid bruit 08/2008   Normal Doppler  . DUB (dysfunctional uterine bleeding)   . Esophagitis   . HTN (hypertension)    Tx started 06/2010  . Hyperlipidemia   . Hypothyroidism   . MHA (microangiopathic hemolytic anemia) (HCC)   . Personal history of chemotherapy    2003  . Personal history of radiation therapy    2003 right breast  . Spinal stenosis     Social History   Socioeconomic History  . Marital status: Married    Spouse name: Not on file  . Number of children: Not on file  . Years of education: Not on file  . Highest education level: Not on file  Occupational History  . Not on file  Social Needs  . Financial resource strain: Not on file  . Food insecurity:    Worry: Not on file    Inability: Not on file  . Transportation needs:    Medical: Not on file    Non-medical: Not on file  Tobacco Use  . Smoking status: Never Smoker  . Smokeless tobacco: Never Used  Substance and  Sexual Activity  . Alcohol use: No  . Drug use: No  . Sexual activity: Not on file  Lifestyle  . Physical activity:    Days per week: Not on file    Minutes per session: Not on file  . Stress: Not on file  Relationships  . Social connections:    Talks on phone: Not on file    Gets together: Not on file    Attends religious service: Not on file    Active member of club or organization: Not on file    Attends meetings of clubs or organizations: Not on file    Relationship status: Not on file  . Intimate partner violence:    Fear of current or ex partner: Not on file    Emotionally abused: Not on file    Physically abused: Not on file    Forced sexual activity: Not on file  Other Topics Concern  . Not on file  Social History Narrative   Retired in March    Married for 69 years    One daughter, lives in [place]    Has four grandchildren      Likes to play with grand kids, go to [employer] and mountains.        Past Surgical History:  Procedure Laterality Date  .  BREAST BIOPSY Right    2007 benign  . BREAST LUMPECTOMY Right    2003  . BTL    . CATARACT EXTRACTION, BILATERAL Bilateral 03/26/2016   still blurred vision; Md states this is fine  . CERVICAL LAMINECTOMY     Left  . FOOT SURGERY    . MASTECTOMY W/ NODES PARTIAL     Right  . SHOULDER ARTHROSCOPY Right   . TUBAL LIGATION  1981    Family History  Problem Relation Age of Onset  . Heart attack Father   . Coronary artery disease Father   . Diabetes Father   . Coronary artery disease Mother   . Leukemia Mother   . Thyroid disease Mother   . Multiple sclerosis Sister        Twin  . Breast cancer Maternal Aunt     Allergies  Allergen Reactions  . Ambien Cr [Zolpidem] Other (See Comments)    Hallucinations  . Erythromycin     REACTION: rash  . Hydrocodone Other (See Comments)    Hallucinations  . Lisinopril Cough  . Penicillins     REACTION: rash  . Clindamycin/Lincomycin Rash    Current Outpatient  Medications on File Prior to Visit  Medication Sig Dispense Refill  . aspirin 81 MG EC tablet Take 81 mg by mouth daily.      . calcium carbonate (OS-CAL) 600 MG TABS tablet Take 1 tablet (600 mg total) by mouth 2 (two) times daily with a meal. 180 tablet 3  . GLUCOSAMINE PO Take 1 tablet by mouth 2 (two) times daily.    . hydrochlorothiazide (MICROZIDE) 12.5 MG capsule Take 1 capsule (12.5 mg total) by mouth daily. 90 capsule 3  . levothyroxine (SYNTHROID) 88 MCG tablet TAKE 1 TABLET DAILY 90 tablet 3  . losartan (COZAAR) 25 MG tablet Take 1 tablet (25 mg total) by mouth daily. 90 tablet 2  . omeprazole (PRILOSEC) 20 MG capsule TAKE 1 BY MOUTH DAILY 90 capsule 3  . Polyethyl Glycol-Propyl Glycol (SYSTANE OP) Apply to eye. PLACE 2 DROPS INTO EACH EYE AS NEEDED    . simvastatin (ZOCOR) 40 MG tablet TAKE 1 BY MOUTH AT BEDTIME 90 tablet 3   No current facility-administered medications on file prior to visit.     BP 130/62 (BP Location: Left Arm)   Temp 98.4 F (36.9 C) (Oral)   Wt 147 lb (66.7 kg)   BMI 26.89 kg/m   Wt Readings from Last 3 Encounters:  11/01/17 147 lb (66.7 kg)  10/11/17 145 lb (65.8 kg)  04/28/17 143 lb (64.9 kg)       Objective:   Physical Exam  Constitutional: She is oriented to person, place, and time. She appears well-developed and well-nourished. No distress.  Cardiovascular: Normal rate, regular rhythm, normal heart sounds and intact distal pulses. Exam reveals no gallop and no friction rub.  No murmur heard. Pulmonary/Chest: Effort normal and breath sounds normal. No respiratory distress. She has no wheezes. She has no rales. She exhibits no tenderness.  Musculoskeletal: Normal range of motion. She exhibits edema (non pitting edema to bilateral lower extremities. ). She exhibits no tenderness or deformity.  Neurological: She is alert and oriented to person, place, and time.  Skin: Skin is warm and dry. No rash noted. She is not diaphoretic. No erythema. No  pallor.  Psychiatric: She has a normal mood and affect. Her behavior is normal. Judgment and thought content normal.  Nursing note and vitals reviewed.  Assessment & Plan:  1. Bilateral lower extremity edema - Will trial eating out less, elevating legs above heart while at rest and compression socks before increasing HCTZ  - Follow up if no improvement   Dorothyann Peng, NP

## 2017-11-14 ENCOUNTER — Encounter: Payer: Self-pay | Admitting: Adult Health

## 2017-12-05 ENCOUNTER — Encounter: Payer: Self-pay | Admitting: Adult Health

## 2017-12-07 ENCOUNTER — Other Ambulatory Visit: Payer: Self-pay | Admitting: Adult Health

## 2017-12-07 ENCOUNTER — Telehealth: Payer: Self-pay | Admitting: Family Medicine

## 2017-12-07 NOTE — Telephone Encounter (Signed)
Sent to the pharmacy by e-scribe. 

## 2017-12-07 NOTE — Telephone Encounter (Signed)
Copied from [place] [id number]. Topic: Inquiry >> Dec 07, 2017 12:57 PM Scherrie Gerlach wrote: Reason for CRM: pt called to follow up on the reason she was denied.  Advised pt we sent the request for her records to [employer], and they are the ones that know what was released to [place]. Pt given [employer] number to call for further assistance. Pt has sent a mychart message concerning this issue

## 2017-12-07 NOTE — Telephone Encounter (Signed)
Noted  

## 2017-12-09 NOTE — Telephone Encounter (Signed)
LMTCB  CRM created.

## 2018-01-02 ENCOUNTER — Other Ambulatory Visit: Payer: Self-pay | Admitting: Adult Health

## 2018-01-02 DIAGNOSIS — I10 Essential (primary) hypertension: Secondary | ICD-10-CM

## 2018-01-03 NOTE — Telephone Encounter (Signed)
Denied.  Filled for 9 months on 07/29/2017.  Message sent to the pharmacy.

## 2018-01-16 DIAGNOSIS — D225 Melanocytic nevi of trunk: Secondary | ICD-10-CM | POA: Diagnosis not present

## 2018-01-16 DIAGNOSIS — D2371 Other benign neoplasm of skin of right lower limb, including hip: Secondary | ICD-10-CM | POA: Diagnosis not present

## 2018-01-16 DIAGNOSIS — L570 Actinic keratosis: Secondary | ICD-10-CM | POA: Diagnosis not present

## 2018-01-16 DIAGNOSIS — Z85828 Personal history of other malignant neoplasm of skin: Secondary | ICD-10-CM | POA: Diagnosis not present

## 2018-01-16 DIAGNOSIS — L219 Seborrheic dermatitis, unspecified: Secondary | ICD-10-CM | POA: Diagnosis not present

## 2018-01-16 DIAGNOSIS — L821 Other seborrheic keratosis: Secondary | ICD-10-CM | POA: Diagnosis not present

## 2018-02-02 DIAGNOSIS — Z808 Family history of malignant neoplasm of other organs or systems: Secondary | ICD-10-CM | POA: Diagnosis not present

## 2018-02-02 DIAGNOSIS — M8580 Other specified disorders of bone density and structure, unspecified site: Secondary | ICD-10-CM | POA: Diagnosis not present

## 2018-02-02 DIAGNOSIS — Z6826 Body mass index (BMI) 26.0-26.9, adult: Secondary | ICD-10-CM | POA: Diagnosis not present

## 2018-02-02 DIAGNOSIS — Z124 Encounter for screening for malignant neoplasm of cervix: Secondary | ICD-10-CM | POA: Diagnosis not present

## 2018-02-02 DIAGNOSIS — Z806 Family history of leukemia: Secondary | ICD-10-CM | POA: Diagnosis not present

## 2018-02-02 DIAGNOSIS — Z853 Personal history of malignant neoplasm of breast: Secondary | ICD-10-CM | POA: Diagnosis not present

## 2018-02-02 DIAGNOSIS — Z01419 Encounter for gynecological examination (general) (routine) without abnormal findings: Secondary | ICD-10-CM | POA: Diagnosis not present

## 2018-02-10 ENCOUNTER — Telehealth: Payer: Self-pay | Admitting: Adult Health

## 2018-02-10 DIAGNOSIS — Z026 Encounter for examination for insurance purposes: Secondary | ICD-10-CM

## 2018-02-10 NOTE — Telephone Encounter (Signed)
Ok to refer.

## 2018-02-10 NOTE — Telephone Encounter (Signed)
Referral placed.  No further action required. 

## 2018-02-10 NOTE — Telephone Encounter (Signed)
Patient got turned down for long-term insurance due to memory issues.  Insurance states she needs to see a board certified person to administer the Wechsler Adult Intelligence Scale and Memory Scale tests at [facility].  They are requesting Tommi Rumps write out a referral to [facility].  The referral must be faxed to [phone].  The office is at [place].

## 2018-02-14 ENCOUNTER — Encounter: Payer: Self-pay | Admitting: Adult Health

## 2018-03-04 LAB — HM DIABETES EYE EXAM

## 2018-03-16 DIAGNOSIS — Z809 Family history of malignant neoplasm, unspecified: Secondary | ICD-10-CM | POA: Diagnosis not present

## 2018-03-28 ENCOUNTER — Encounter: Payer: Self-pay | Admitting: Family Medicine

## 2018-04-03 ENCOUNTER — Encounter: Payer: Self-pay | Admitting: Adult Health

## 2018-04-03 ENCOUNTER — Telehealth: Payer: Self-pay

## 2018-04-03 NOTE — Telephone Encounter (Signed)
-----   Message -----  From: Lovey Newcomer  Sent: 04/03/2018  1:32 PM EDT  To: [facility] Clinical Pool  Subject: Non-Urgent Medical Question             Back in June I asked for a referral to go to [place] but the referral was sent to [facility]ist but nobody had called me to set up an appointment. I am doing this to try to get some long term insurance. Please send the referral to [place].    Per referral pt was originally referred to [facility]. This is where he requested to be sent.

## 2018-04-05 ENCOUNTER — Other Ambulatory Visit: Payer: Self-pay | Admitting: Adult Health

## 2018-04-05 DIAGNOSIS — I10 Essential (primary) hypertension: Secondary | ICD-10-CM

## 2018-04-05 DIAGNOSIS — Z760 Encounter for issue of repeat prescription: Secondary | ICD-10-CM

## 2018-04-05 NOTE — Telephone Encounter (Signed)
Sent to the pharmacy by e-scribe.  Pt has upcoming appt with [facility] for CPX.

## 2018-04-17 NOTE — Telephone Encounter (Signed)
Called pt spoke with her to inform her about the message we received from [facility]  She stated ok and will follow up

## 2018-04-17 NOTE — Telephone Encounter (Signed)
There are no updated notes on this referral since 03/02/18. Pt's message is from 04/03/18 and referral does not indicate any changes or if it was sent to recommended provider. Please update notes/status. Thanks!

## 2018-05-02 ENCOUNTER — Ambulatory Visit (INDEPENDENT_AMBULATORY_CARE_PROVIDER_SITE_OTHER): Payer: PPO | Admitting: Adult Health

## 2018-05-02 ENCOUNTER — Encounter: Payer: Self-pay | Admitting: Adult Health

## 2018-05-02 VITALS — BP 110/70 | HR 81 | Temp 98.5°F | Ht 61.0 in | Wt 140.6 lb

## 2018-05-02 DIAGNOSIS — E039 Hypothyroidism, unspecified: Secondary | ICD-10-CM

## 2018-05-02 DIAGNOSIS — Z23 Encounter for immunization: Secondary | ICD-10-CM

## 2018-05-02 DIAGNOSIS — Z0000 Encounter for general adult medical examination without abnormal findings: Secondary | ICD-10-CM | POA: Diagnosis not present

## 2018-05-02 DIAGNOSIS — E782 Mixed hyperlipidemia: Secondary | ICD-10-CM | POA: Diagnosis not present

## 2018-05-02 DIAGNOSIS — I10 Essential (primary) hypertension: Secondary | ICD-10-CM | POA: Diagnosis not present

## 2018-05-02 DIAGNOSIS — Z760 Encounter for issue of repeat prescription: Secondary | ICD-10-CM

## 2018-05-02 LAB — BASIC METABOLIC PANEL
BUN: 26 mg/dL — AB (ref 6–23)
CO2: 28 mEq/L (ref 19–32)
CREATININE: 0.66 mg/dL (ref 0.40–1.20)
Calcium: 9.7 mg/dL (ref 8.4–10.5)
Chloride: 102 mEq/L (ref 96–112)
GFR: 94.89 mL/min (ref >60.00)
GLUCOSE: 86 mg/dL (ref 70–99)
Potassium: 4.1 mEq/L (ref 3.5–5.1)
Sodium: 141 mEq/L (ref 135–145)

## 2018-05-02 LAB — LIPID PANEL
CHOLESTEROL: 198 mg/dL (ref 0–200)
HDL: 66.5 mg/dL (ref >39.00)
LDL Cholesterol: 120 mg/dL — ABNORMAL HIGH (ref 0–99)
NONHDL: 131.52
Total CHOL/HDL Ratio: 3
Triglycerides: 57 mg/dL (ref 0.0–149.0)
VLDL: 11.4 mg/dL (ref 0.0–40.0)

## 2018-05-02 LAB — CBC WITH DIFFERENTIAL/PLATELET
BASOS PCT: 0.8 % (ref 0.0–3.0)
Basophils Absolute: 0 10*3/uL (ref 0.0–0.1)
EOS ABS: 0.2 10*3/uL (ref 0.0–0.7)
Eosinophils Relative: 3.4 % (ref 0.0–5.0)
HCT: 42 % (ref 36.0–46.0)
HEMOGLOBIN: 14.2 g/dL (ref 12.0–15.0)
Lymphocytes Relative: 30.2 % (ref 12.0–46.0)
Lymphs Abs: 1.6 10*3/uL (ref 0.7–4.0)
MCHC: 33.7 g/dL (ref 30.0–36.0)
MCV: 92.3 fl (ref 78.0–100.0)
MONO ABS: 0.4 10*3/uL (ref 0.1–1.0)
Monocytes Relative: 7.2 % (ref 3.0–12.0)
NEUTROS PCT: 58.4 % (ref 43.0–77.0)
Neutro Abs: 3 10*3/uL (ref 1.4–7.7)
Platelets: 288 10*3/uL (ref 150.0–400.0)
RBC: 4.56 Mil/uL (ref 3.87–5.11)
RDW: 13.4 % (ref 11.5–15.5)
WBC: 5.2 10*3/uL (ref 4.0–10.5)

## 2018-05-02 LAB — HEPATIC FUNCTION PANEL
ALBUMIN: 4.5 g/dL (ref 3.5–5.2)
ALT: 16 U/L (ref 0–35)
AST: 15 U/L (ref 0–37)
Alkaline Phosphatase: 67 U/L (ref 39–117)
BILIRUBIN TOTAL: 0.4 mg/dL (ref 0.2–1.2)
Bilirubin, Direct: 0.1 mg/dL (ref 0.0–0.3)
TOTAL PROTEIN: 7.1 g/dL (ref 6.0–8.3)

## 2018-05-02 LAB — TSH: TSH: 0.55 u[IU]/mL (ref 0.35–4.50)

## 2018-05-02 MED ORDER — LOSARTAN POTASSIUM 25 MG PO TABS: 25.0000 mg | ORAL_TABLET | Freq: Every day | ORAL | 3 refills | Status: DC

## 2018-05-02 MED ORDER — OMEPRAZOLE 20 MG PO CPDR: DELAYED_RELEASE_CAPSULE | ORAL | 3 refills | Status: DC

## 2018-05-02 MED ORDER — SIMVASTATIN 40 MG PO TABS: ORAL_TABLET | ORAL | 3 refills | Status: DC

## 2018-05-02 MED ORDER — LEVOTHYROXINE SODIUM 88 MCG PO TABS: ORAL_TABLET | ORAL | 3 refills | Status: DC

## 2018-05-02 MED ORDER — HYDROCHLOROTHIAZIDE 12.5 MG PO CAPS: 12.5000 mg | ORAL_CAPSULE | Freq: Every day | ORAL | 3 refills | Status: DC

## 2018-05-02 NOTE — Progress Notes (Signed)
Subjective:    Patient ID: Patricia Solis, female    DOB: Nov 16, 1950, 67 y.o.   MRN: [id number]  HPI Patient presents for yearly preventative medicine examination. She is a pleasant 67 year old female who  has a past medical history of Basal cell carcinoma (2007), Breast cancer ([place]), Carotid bruit (08/2008), DUB (dysfunctional uterine bleeding), Esophagitis, HTN (hypertension), Hyperlipidemia, Hypothyroidism, MHA (microangiopathic hemolytic anemia) ([place]), Personal history of chemotherapy, Personal history of radiation therapy, and Spinal stenosis.  Essential Hypertension -takes HCTZ 12.5 mg and Cozaar 25 mg. Denies dizziness, lightheadedness, chest pain, or SOB.  BP Readings from Last 3 Encounters:  05/02/18 110/70  11/01/17 130/62  10/11/17 118/66   Hypothyroidism - Takes Synthroid 88 mcg  Lab Results  Component Value Date   TSH 0.49 04/28/2017   Hyperlipidemia - Takes Simvastatin 40 mg. Denies muscle cramping or pain Lab Results  Component Value Date   CHOL 207 (H) 04/28/2017   HDL 62.60 04/28/2017   LDLCALC 133 (H) 04/28/2017   LDLDIRECT 140.4 09/02/2006   TRIG 60.0 04/28/2017   CHOLHDL 3 04/28/2017   GERD - Controlled with Prilosec   All immunizations and health maintenance protocols were reviewed with the patient and needed orders were placed. Will receive seasonal flu vaccination today   Appropriate screening laboratory values were ordered for the patient including screening of hyperlipidemia, renal function and hepatic function.  Medication reconciliation, past medical history, social history, problem list and allergies were reviewed in detail with the patient  Goals were established with regard to weight loss, exercise, and  diet in compliance with medications.  She tries to eat a heart healthy diet and exercises at the [facility] 3 times a week. Wt Readings from Last 3 Encounters:  05/02/18 140 lb 9.6 oz (63.8 kg)  11/01/17 147 lb (66.7 kg)  10/11/17 145 lb (65.8 kg)    End of life planning was discussed.  She is up-to-date on routine health maintenance items such as colonoscopy, mammogram, DEXA scan, dental and vision screens  She has no acute issues   Review of Systems  Constitutional: Negative.   HENT: Negative.   Eyes: Negative.   Respiratory: Negative.   Cardiovascular: Negative.   Gastrointestinal: Negative.   Endocrine: Negative.   Genitourinary: Negative.   Musculoskeletal: Negative.   Skin: Negative.   Allergic/Immunologic: Negative.   Neurological: Negative.   Hematological: Negative.   Psychiatric/Behavioral: Negative.    Past Medical History:  Diagnosis Date  . Basal cell carcinoma 2007   BCC  . Breast cancer ([place])    Invasive lobular carcinoma RIGHT breast  . Carotid bruit 08/2008   Normal Doppler  . DUB (dysfunctional uterine bleeding)   . Esophagitis   . HTN (hypertension)    Tx started 06/2010  . Hyperlipidemia   . Hypothyroidism   . MHA (microangiopathic hemolytic anemia) ([facility])   . Personal history of chemotherapy    2003  . Personal history of radiation therapy    2003 right breast  . Spinal stenosis     Social History   Socioeconomic History  . Marital status: Married    Spouse name: Not on file  . Number of children: Not on file  . Years of education: Not on file  . Highest education level: Not on file  Occupational History  . Not on file  Social Needs  . Financial resource strain: Not on file  . Food insecurity:    Worry: Not on file    Inability: Not on  file  . Transportation needs:    Medical: Not on file    Non-medical: Not on file  Tobacco Use  . Smoking status: Never Smoker  . Smokeless tobacco: Never Used  Substance and Sexual Activity  . Alcohol use: No  . Drug use: No  . Sexual activity: Not on file  Lifestyle  . Physical activity:    Days per week: Not on file    Minutes per session: Not on file  . Stress: Not on file  Relationships  . Social connections:    Talks on phone:  Not on file    Gets together: Not on file    Attends religious service: Not on file    Active member of club or organization: Not on file    Attends meetings of clubs or organizations: Not on file    Relationship status: Not on file  . Intimate partner violence:    Fear of current or ex partner: Not on file    Emotionally abused: Not on file    Physically abused: Not on file    Forced sexual activity: Not on file  Other Topics Concern  . Not on file  Social History Narrative   Retired in March    Married for 48 years    One daughter, lives in [place]    Has four grandchildren      Likes to play with grand kids, go to [employer] and mountains.        Past Surgical History:  Procedure Laterality Date  . BREAST BIOPSY Right    2007 benign  . BREAST LUMPECTOMY Right    2003  . BTL    . CATARACT EXTRACTION, BILATERAL Bilateral 03/26/2016   still blurred vision; Md states this is fine  . CERVICAL LAMINECTOMY     Left  . FOOT SURGERY    . MASTECTOMY W/ NODES PARTIAL     Right  . SHOULDER ARTHROSCOPY Right   . TUBAL LIGATION  1981    Family History  Problem Relation Age of Onset  . Heart attack Father   . Coronary artery disease Father   . Diabetes Father   . Coronary artery disease Mother   . Leukemia Mother   . Thyroid disease Mother   . Multiple sclerosis Sister        Twin  . Breast cancer Maternal Aunt     Allergies  Allergen Reactions  . Ambien Cr [Zolpidem] Other (See Comments)    Hallucinations  . Erythromycin     REACTION: rash  . Hydrocodone Other (See Comments)    Hallucinations  . Lisinopril Cough  . Penicillins     REACTION: rash  . Clindamycin/Lincomycin Rash    Current Outpatient Medications on File Prior to Visit  Medication Sig Dispense Refill  . aspirin 81 MG EC tablet Take 81 mg by mouth daily.      . calcium carbonate (OS-CAL) 600 MG TABS tablet Take 1 tablet (600 mg total) by mouth 2 (two) times daily with a meal. 180 tablet 3  .  GLUCOSAMINE PO Take 1 tablet by mouth 2 (two) times daily.    . hydrochlorothiazide (MICROZIDE) 12.5 MG capsule Take 1 capsule by mouth daily 90 capsule 0  . levothyroxine (SYNTHROID) 88 MCG tablet TAKE 1 TABLET DAILY 90 tablet 3  . losartan (COZAAR) 25 MG tablet Take 1 tablet by mouth daily 90 tablet 0  . omeprazole (PRILOSEC) 20 MG capsule TAKE 1 BY MOUTH DAILY 90 capsule  3  . Polyethyl Glycol-Propyl Glycol (SYSTANE OP) Apply to eye. PLACE 2 DROPS INTO EACH EYE AS NEEDED    . simvastatin (ZOCOR) 40 MG tablet Take 1 tablet by mouth every night at bedtime 90 tablet 1   No current facility-administered medications on file prior to visit.     BP 110/70 (BP Location: Left Arm, Patient Position: Sitting, Cuff Size: Normal)   Pulse 81   Temp 98.5 F (36.9 C) (Oral)   Ht 5\' 1"  (1.549 m)   Wt 140 lb 9.6 oz (63.8 kg)   SpO2 96%   BMI 26.57 kg/m       Objective:   Physical Exam  Constitutional: She is oriented to person, place, and time. She appears well-developed and well-nourished. No distress.  HENT:  Head: Normocephalic and atraumatic.  Right Ear: External ear normal.  Left Ear: External ear normal.  Nose: Nose normal.  Mouth/Throat: Oropharynx is clear and moist. No oropharyngeal exudate.  Eyes: Pupils are equal, round, and reactive to light. Conjunctivae and EOM are normal. Right eye exhibits no discharge. Left eye exhibits no discharge. No scleral icterus.  Neck: Normal range of motion. Neck supple. No JVD present. No tracheal deviation present. No thyromegaly present.  Cardiovascular: Normal rate, regular rhythm, normal heart sounds and intact distal pulses. Exam reveals no gallop and no friction rub.  No murmur heard. Pulmonary/Chest: Effort normal and breath sounds normal. No stridor. No respiratory distress. She has no wheezes. She has no rales. She exhibits no tenderness.  Abdominal: Soft. Bowel sounds are normal. She exhibits no distension and no mass. There is no tenderness.  There is no rebound and no guarding. No hernia.  Musculoskeletal: Normal range of motion. She exhibits no edema, tenderness or deformity.  Lymphadenopathy:    She has no cervical adenopathy.  Neurological: She is alert and oriented to person, place, and time. She displays normal reflexes. No cranial nerve deficit or sensory deficit. She exhibits normal muscle tone. Coordination normal.  Skin: Skin is warm and dry. Capillary refill takes less than 2 seconds. No rash noted. She is not diaphoretic. No erythema. No pallor.  Psychiatric: She has a normal mood and affect. Her behavior is normal. Judgment and thought content normal.  Nursing note and vitals reviewed.     Assessment & Plan:  1. Routine general medical examination at a health care facility - Continue to diet and exercise  - Follow up in one year or sooner if needed - Basic metabolic panel - CBC with Differential/Platelet - Hepatic function panel - Lipid panel - TSH  2. Hypothyroidism, unspecified type - Consider increase in synthroid  - Basic metabolic panel - CBC with Differential/Platelet - Hepatic function panel - Lipid panel - TSH  3. Mixed hyperlipidemia - Consider increase in statin  - Basic metabolic panel - CBC with Differential/Platelet - Hepatic function panel - Lipid panel - TSH  4. Essential hypertension - Well controlled. No change in medication at this time. Advised that if she continues to lose weight we will have to titrate her BP medications . - Basic metabolic panel - CBC with Differential/Platelet - Hepatic function panel - Lipid panel - TSH  5. Need for influenza vaccination  - Flu vaccine HIGH DOSE PF (Fluzone High dose)  Dorothyann Peng, NP

## 2018-06-06 ENCOUNTER — Encounter: Payer: Self-pay | Admitting: Adult Health

## 2018-06-06 DIAGNOSIS — E039 Hypothyroidism, unspecified: Secondary | ICD-10-CM

## 2018-06-06 DIAGNOSIS — Z760 Encounter for issue of repeat prescription: Secondary | ICD-10-CM

## 2018-06-06 DIAGNOSIS — I10 Essential (primary) hypertension: Secondary | ICD-10-CM

## 2018-06-07 MED ORDER — LOSARTAN POTASSIUM 25 MG PO TABS: 25.0000 mg | ORAL_TABLET | Freq: Every day | ORAL | 3 refills | Status: DC

## 2018-06-07 MED ORDER — OMEPRAZOLE 20 MG PO CPDR: DELAYED_RELEASE_CAPSULE | ORAL | 3 refills | Status: DC

## 2018-06-07 MED ORDER — LEVOTHYROXINE SODIUM 88 MCG PO TABS: ORAL_TABLET | ORAL | 3 refills | Status: DC

## 2018-06-07 MED ORDER — HYDROCHLOROTHIAZIDE 12.5 MG PO CAPS: 12.5000 mg | ORAL_CAPSULE | Freq: Every day | ORAL | 3 refills | Status: DC

## 2018-06-07 MED ORDER — SIMVASTATIN 40 MG PO TABS: ORAL_TABLET | ORAL | 3 refills | Status: DC

## 2018-08-17 ENCOUNTER — Encounter: Payer: Self-pay | Admitting: Adult Health

## 2018-08-18 ENCOUNTER — Ambulatory Visit (INDEPENDENT_AMBULATORY_CARE_PROVIDER_SITE_OTHER): Payer: PPO | Admitting: Adult Health

## 2018-08-18 ENCOUNTER — Encounter: Payer: Self-pay | Admitting: Adult Health

## 2018-08-18 VITALS — BP 118/62 | HR 77 | Temp 99.0°F | Ht 61.0 in | Wt 143.8 lb

## 2018-08-18 DIAGNOSIS — R05 Cough: Secondary | ICD-10-CM

## 2018-08-18 DIAGNOSIS — R059 Cough, unspecified: Secondary | ICD-10-CM

## 2018-08-18 MED ORDER — HYDROCODONE-HOMATROPINE 5-1.5 MG/5ML PO SYRP: 5.0000 mL | ORAL_SOLUTION | Freq: Three times a day (TID) | ORAL | 0 refills | Status: DC | PRN

## 2018-08-18 MED ORDER — PREDNISONE 10 MG PO TABS: ORAL_TABLET | ORAL | 0 refills | Status: DC

## 2018-08-18 NOTE — Progress Notes (Signed)
Subjective:    Patient ID: Patricia Solis, female    DOB: Mar 31, 1951, 68 y.o.   MRN: [id number]  Cough  This is a new problem. The current episode started 1 to 4 weeks ago. The problem has been unchanged. The problem occurs constantly. The cough is non-productive. Pertinent negatives include no chills, ear congestion, ear pain, fever, headaches, nasal congestion, postnasal drip, rhinorrhea, sore throat, shortness of breath, sweats or wheezing. Treatments tried: Cough drops and delsym  The treatment provided no relief. There is no history of asthma, bronchiectasis, bronchitis, emphysema, environmental allergies or pneumonia.      Review of Systems  Constitutional: Negative for chills and fever.  HENT: Negative for ear pain, postnasal drip, rhinorrhea and sore throat.   Respiratory: Positive for cough. Negative for shortness of breath and wheezing.   Allergic/Immunologic: Negative for environmental allergies.  Neurological: Negative for headaches.   Past Medical History:  Diagnosis Date  . Basal cell carcinoma 2007   BCC  . Breast cancer ([place])    Invasive lobular carcinoma RIGHT breast  . Carotid bruit 08/2008   Normal Doppler  . DUB (dysfunctional uterine bleeding)   . Esophagitis   . HTN (hypertension)    Tx started 06/2010  . Hyperlipidemia   . Hypothyroidism   . MHA (microangiopathic hemolytic anemia) ([facility])   . Personal history of chemotherapy    2003  . Personal history of radiation therapy    2003 right breast  . Spinal stenosis     Social History   Socioeconomic History  . Marital status: Married    Spouse name: Not on file  . Number of children: Not on file  . Years of education: Not on file  . Highest education level: Not on file  Occupational History  . Not on file  Social Needs  . Financial resource strain: Not on file  . Food insecurity:    Worry: Not on file    Inability: Not on file  . Transportation needs:    Medical: Not on file    Non-medical: Not  on file  Tobacco Use  . Smoking status: Never Smoker  . Smokeless tobacco: Never Used  Substance and Sexual Activity  . Alcohol use: No  . Drug use: No  . Sexual activity: Not on file  Lifestyle  . Physical activity:    Days per week: Not on file    Minutes per session: Not on file  . Stress: Not on file  Relationships  . Social connections:    Talks on phone: Not on file    Gets together: Not on file    Attends religious service: Not on file    Active member of club or organization: Not on file    Attends meetings of clubs or organizations: Not on file    Relationship status: Not on file  . Intimate partner violence:    Fear of current or ex partner: Not on file    Emotionally abused: Not on file    Physically abused: Not on file    Forced sexual activity: Not on file  Other Topics Concern  . Not on file  Social History Narrative   Retired in March    Married for 59 years    One daughter, lives in [place]    Has four grandchildren      Likes to play with grand kids, go to [employer] and mountains.        Past Surgical History:  Procedure  Laterality Date  . BREAST BIOPSY Right    2007 benign  . BREAST LUMPECTOMY Right    2003  . BTL    . CATARACT EXTRACTION, BILATERAL Bilateral 03/26/2016   still blurred vision; Md states this is fine  . CERVICAL LAMINECTOMY     Left  . FOOT SURGERY    . MASTECTOMY W/ NODES PARTIAL     Right  . SHOULDER ARTHROSCOPY Right   . TUBAL LIGATION  1981    Family History  Problem Relation Age of Onset  . Heart attack Father   . Coronary artery disease Father   . Diabetes Father   . Coronary artery disease Mother   . Leukemia Mother   . Thyroid disease Mother   . Multiple sclerosis Sister        Twin  . Breast cancer Maternal Aunt     Allergies  Allergen Reactions  . Ambien Cr [Zolpidem] Other (See Comments)    Hallucinations  . Erythromycin     REACTION: rash  . Hydrocodone Other (See Comments)    Hallucinations  .  Lisinopril Cough  . Penicillins     REACTION: rash  . Clindamycin/Lincomycin Rash    Current Outpatient Medications on File Prior to Visit  Medication Sig Dispense Refill  . aspirin 81 MG EC tablet Take 81 mg by mouth daily.      . calcium carbonate (OS-CAL) 600 MG TABS tablet Take 1 tablet (600 mg total) by mouth 2 (two) times daily with a meal. 180 tablet 3  . GLUCOSAMINE PO Take 1 tablet by mouth 2 (two) times daily.    . hydrochlorothiazide (MICROZIDE) 12.5 MG capsule Take 1 capsule (12.5 mg total) by mouth daily. 90 capsule 3  . levothyroxine (SYNTHROID) 88 MCG tablet TAKE 1 TABLET DAILY 90 tablet 3  . losartan (COZAAR) 25 MG tablet Take 1 tablet (25 mg total) by mouth daily. 90 tablet 3  . omeprazole (PRILOSEC) 20 MG capsule TAKE 1 BY MOUTH DAILY 90 capsule 3  . Polyethyl Glycol-Propyl Glycol (SYSTANE OP) Apply to eye. PLACE 2 DROPS INTO EACH EYE AS NEEDED    . simvastatin (ZOCOR) 40 MG tablet Take 1 tablet by mouth every night at bedtime 90 tablet 3   No current facility-administered medications on file prior to visit.     BP 118/62 (BP Location: Left Arm, Patient Position: Sitting, Cuff Size: Normal)   Pulse 77   Temp 99 F (37.2 C) (Oral)   Ht 5\' 1"  (1.549 m)   Wt 143 lb 12.8 oz (65.2 kg)   SpO2 96%   BMI 27.17 kg/m       Objective:   Physical Exam Vitals signs and nursing note reviewed.  Constitutional:      Appearance: Normal appearance. She is not ill-appearing.  HENT:     Right Ear: Tympanic membrane, ear canal and external ear normal.     Left Ear: Tympanic membrane, ear canal and external ear normal.     Mouth/Throat:     Mouth: Mucous membranes are moist.     Pharynx: Oropharynx is clear.  Cardiovascular:     Rate and Rhythm: Normal rate and regular rhythm.     Pulses: Normal pulses.     Heart sounds: Normal heart sounds.  Pulmonary:     Effort: Pulmonary effort is normal. No respiratory distress.     Breath sounds: Normal breath sounds. No stridor.  No wheezing, rhonchi or rales.     Comments: Constant dry  cough   Chest:     Chest wall: No tenderness.  Abdominal:     General: Abdomen is flat.     Palpations: Abdomen is soft.     Tenderness: There is no abdominal tenderness.  Musculoskeletal: Normal range of motion.  Skin:    General: Skin is warm and dry.  Neurological:     General: No focal deficit present.     Mental Status: She is alert and oriented to person, place, and time.  Psychiatric:        Mood and Affect: Mood normal.        Behavior: Behavior normal.        Thought Content: Thought content normal.        Judgment: Judgment normal.       Assessment & Plan:  1. Cough - Likely viral  - Will prescribe prednisone course and hycodan cough syrup. She knows that hycodan can make her sleepy so only use it at night. Follow up if no improvement over the weekend  - predniSONE (DELTASONE) 10 MG tablet; 40 mg x 3 days, 20 mg x 3 days, 10 mg x 3 days  Dispense: 21 tablet; Refill: 0 - HYDROcodone-homatropine (HYCODAN) 5-1.5 MG/5ML syrup; Take 5 mLs by mouth every 8 (eight) hours as needed for cough.  Dispense: 120 mL; Refill: 0  Dorothyann Peng, NP

## 2018-08-22 ENCOUNTER — Encounter: Payer: Self-pay | Admitting: Adult Health

## 2018-08-22 ENCOUNTER — Other Ambulatory Visit: Payer: Self-pay | Admitting: Adult Health

## 2018-08-22 MED ORDER — BENZONATATE 200 MG PO CAPS: 200.0000 mg | ORAL_CAPSULE | Freq: Two times a day (BID) | ORAL | 0 refills | Status: DC | PRN

## 2018-08-22 MED ORDER — DOXYCYCLINE HYCLATE 100 MG PO CAPS: 100.0000 mg | ORAL_CAPSULE | Freq: Two times a day (BID) | ORAL | 0 refills | Status: DC

## 2018-08-22 MED ORDER — GABAPENTIN 300 MG PO CAPS: 300.0000 mg | ORAL_CAPSULE | Freq: Every day | ORAL | 0 refills | Status: DC

## 2018-08-28 ENCOUNTER — Encounter: Payer: Self-pay | Admitting: Adult Health

## 2018-08-29 ENCOUNTER — Encounter: Payer: Self-pay | Admitting: Adult Health

## 2018-08-29 ENCOUNTER — Ambulatory Visit (INDEPENDENT_AMBULATORY_CARE_PROVIDER_SITE_OTHER): Payer: PPO

## 2018-08-29 ENCOUNTER — Other Ambulatory Visit: Payer: Self-pay | Admitting: Adult Health

## 2018-08-29 DIAGNOSIS — R059 Cough, unspecified: Secondary | ICD-10-CM

## 2018-08-29 DIAGNOSIS — R05 Cough: Secondary | ICD-10-CM

## 2018-08-30 ENCOUNTER — Encounter: Payer: Self-pay | Admitting: Adult Health

## 2018-08-30 ENCOUNTER — Other Ambulatory Visit: Payer: Self-pay | Admitting: Adult Health

## 2018-08-30 MED ORDER — PREDNISONE 50 MG PO TABS: ORAL_TABLET | ORAL | 0 refills | Status: DC

## 2018-09-05 DIAGNOSIS — Z961 Presence of intraocular lens: Secondary | ICD-10-CM | POA: Diagnosis not present

## 2018-09-05 DIAGNOSIS — H52203 Unspecified astigmatism, bilateral: Secondary | ICD-10-CM | POA: Diagnosis not present

## 2018-09-05 DIAGNOSIS — H5213 Myopia, bilateral: Secondary | ICD-10-CM | POA: Diagnosis not present

## 2018-09-05 DIAGNOSIS — H524 Presbyopia: Secondary | ICD-10-CM | POA: Diagnosis not present

## 2018-09-11 DIAGNOSIS — R300 Dysuria: Secondary | ICD-10-CM | POA: Diagnosis not present

## 2018-09-11 DIAGNOSIS — R350 Frequency of micturition: Secondary | ICD-10-CM | POA: Diagnosis not present

## 2018-09-12 ENCOUNTER — Other Ambulatory Visit: Payer: Self-pay | Admitting: Obstetrics and Gynecology

## 2018-09-12 DIAGNOSIS — Z1231 Encounter for screening mammogram for malignant neoplasm of breast: Secondary | ICD-10-CM

## 2018-09-12 DIAGNOSIS — R350 Frequency of micturition: Secondary | ICD-10-CM | POA: Diagnosis not present

## 2018-09-18 ENCOUNTER — Encounter: Payer: Self-pay | Admitting: Adult Health

## 2018-09-19 ENCOUNTER — Encounter: Payer: Self-pay | Admitting: Adult Health

## 2018-09-19 ENCOUNTER — Other Ambulatory Visit: Payer: Self-pay | Admitting: Adult Health

## 2018-09-19 MED ORDER — AMLODIPINE BESYLATE 5 MG PO TABS: 5.0000 mg | ORAL_TABLET | Freq: Every day | ORAL | 0 refills | Status: DC

## 2018-09-22 ENCOUNTER — Encounter: Payer: Self-pay | Admitting: Adult Health

## 2018-09-22 ENCOUNTER — Ambulatory Visit (INDEPENDENT_AMBULATORY_CARE_PROVIDER_SITE_OTHER): Payer: PPO | Admitting: Adult Health

## 2018-09-22 VITALS — BP 122/80 | Temp 98.3°F | Wt 146.0 lb

## 2018-09-22 DIAGNOSIS — R059 Cough, unspecified: Secondary | ICD-10-CM

## 2018-09-22 DIAGNOSIS — R05 Cough: Secondary | ICD-10-CM

## 2018-09-22 DIAGNOSIS — I10 Essential (primary) hypertension: Secondary | ICD-10-CM

## 2018-09-22 NOTE — Patient Instructions (Signed)
Please stop Norvasc   Increase HCTZ from one pill to two pills daily   Continue to monitor BP at home and follow up in one week

## 2018-09-22 NOTE — Progress Notes (Signed)
Subjective:    Patient ID: Patricia Solis, female    DOB: 06-18-1951, 68 y.o.   MRN: [id number]  HPI 68 year old female who  has a past medical history of Basal cell carcinoma (2007), Breast cancer ([place]), Carotid bruit (08/2008), DUB (dysfunctional uterine bleeding), Esophagitis, HTN (hypertension), Hyperlipidemia, Hypothyroidism, MHA (microangiopathic hemolytic anemia) ([place]), Personal history of chemotherapy, Personal history of radiation therapy, and Spinal stenosis.  She presents to the office today for follow up regarding cough and hypertension. She has been dealing with a chronic dry cough for greater than 1 month for which we have tried prednisone and various cough medications. Recently she was taken off Cozzar as this was though could be the cause of her cough and started on Norvasc 5 mg   Since stopping Cozaar she reports that her cough has improved but has a constant headache since starting Norvasc.   She has been monitoring her BP at home and reports that she has readings consistently in the 120/80's   BP Readings from Last 3 Encounters:  09/22/18 122/80  08/18/18 118/62  05/02/18 110/70    Review of Systems See HPI   Past Medical History:  Diagnosis Date  . Basal cell carcinoma 2007   BCC  . Breast cancer ([place])    Invasive lobular carcinoma RIGHT breast  . Carotid bruit 08/2008   Normal Doppler  . DUB (dysfunctional uterine bleeding)   . Esophagitis   . HTN (hypertension)    Tx started 06/2010  . Hyperlipidemia   . Hypothyroidism   . MHA (microangiopathic hemolytic anemia) ([facility])   . Personal history of chemotherapy    2003  . Personal history of radiation therapy    2003 right breast  . Spinal stenosis     Social History   Socioeconomic History  . Marital status: Married    Spouse name: Not on file  . Number of children: Not on file  . Years of education: Not on file  . Highest education level: Not on file  Occupational History  . Not on file  Social  Needs  . Financial resource strain: Not on file  . Food insecurity:    Worry: Not on file    Inability: Not on file  . Transportation needs:    Medical: Not on file    Non-medical: Not on file  Tobacco Use  . Smoking status: Never Smoker  . Smokeless tobacco: Never Used  Substance and Sexual Activity  . Alcohol use: No  . Drug use: No  . Sexual activity: Not on file  Lifestyle  . Physical activity:    Days per week: Not on file    Minutes per session: Not on file  . Stress: Not on file  Relationships  . Social connections:    Talks on phone: Not on file    Gets together: Not on file    Attends religious service: Not on file    Active member of club or organization: Not on file    Attends meetings of clubs or organizations: Not on file    Relationship status: Not on file  . Intimate partner violence:    Fear of current or ex partner: Not on file    Emotionally abused: Not on file    Physically abused: Not on file    Forced sexual activity: Not on file  Other Topics Concern  . Not on file  Social History Narrative   Retired in March    Married for  6 years    One daughter, lives in [place]    Has four grandchildren      Likes to play with grand kids, go to [employer] and mountains.        Past Surgical History:  Procedure Laterality Date  . BREAST BIOPSY Right    2007 benign  . BREAST LUMPECTOMY Right    2003  . BTL    . CATARACT EXTRACTION, BILATERAL Bilateral 03/26/2016   still blurred vision; Md states this is fine  . CERVICAL LAMINECTOMY     Left  . FOOT SURGERY    . MASTECTOMY W/ NODES PARTIAL     Right  . SHOULDER ARTHROSCOPY Right   . TUBAL LIGATION  1981    Family History  Problem Relation Age of Onset  . Heart attack Father   . Coronary artery disease Father   . Diabetes Father   . Coronary artery disease Mother   . Leukemia Mother   . Thyroid disease Mother   . Multiple sclerosis Sister        Twin  . Breast cancer Maternal Aunt      Allergies  Allergen Reactions  . Ambien Cr [Zolpidem] Other (See Comments)    Hallucinations  . Cozaar [Losartan] Cough  . Erythromycin     REACTION: rash  . Hydrocodone Other (See Comments)    Hallucinations  . Lisinopril Cough  . Penicillins     REACTION: rash  . Clindamycin/Lincomycin Rash    Current Outpatient Medications on File Prior to Visit  Medication Sig Dispense Refill  . amLODipine (NORVASC) 5 MG tablet Take 1 tablet (5 mg total) by mouth daily. 30 tablet 0  . aspirin 81 MG EC tablet Take 81 mg by mouth daily.      . benzonatate (TESSALON) 200 MG capsule Take 1 capsule (200 mg total) by mouth 2 (two) times daily as needed for cough. 20 capsule 0  . calcium carbonate (OS-CAL) 600 MG TABS tablet Take 1 tablet (600 mg total) by mouth 2 (two) times daily with a meal. 180 tablet 3  . GLUCOSAMINE PO Take 1 tablet by mouth 2 (two) times daily.    . hydrochlorothiazide (MICROZIDE) 12.5 MG capsule Take 1 capsule (12.5 mg total) by mouth daily. 90 capsule 3  . levothyroxine (SYNTHROID) 88 MCG tablet TAKE 1 TABLET DAILY 90 tablet 3  . omeprazole (PRILOSEC) 20 MG capsule TAKE 1 BY MOUTH DAILY 90 capsule 3  . Polyethyl Glycol-Propyl Glycol (SYSTANE OP) Apply to eye. PLACE 2 DROPS INTO EACH EYE AS NEEDED    . predniSONE (DELTASONE) 50 MG tablet Take one tablet every morning for 7 days 7 tablet 0  . simvastatin (ZOCOR) 40 MG tablet Take 1 tablet by mouth every night at bedtime 90 tablet 3   No current facility-administered medications on file prior to visit.     BP 122/80   Temp 98.3 F (36.8 C)   Wt 146 lb (66.2 kg)   BMI 27.59 kg/m       Objective:   Physical Exam Vitals signs and nursing note reviewed.  Constitutional:      Appearance: Normal appearance.  Cardiovascular:     Rate and Rhythm: Normal rate and regular rhythm.     Pulses: Normal pulses.     Heart sounds: Normal heart sounds.  Pulmonary:     Effort: Pulmonary effort is normal.     Breath sounds:  Normal breath sounds.  Abdominal:     Palpations:  Abdomen is soft.  Skin:    General: Skin is warm and dry.     Capillary Refill: Capillary refill takes less than 2 seconds.  Neurological:     General: No focal deficit present.     Mental Status: She is alert and oriented to person, place, and time.  Psychiatric:        Mood and Affect: Mood normal.        Behavior: Behavior normal.        Thought Content: Thought content normal.        Judgment: Judgment normal.        Assessment & Plan:  1. Essential hypertension - Will have her d/c Norvasc - Increase HCTZ to 25 mg daily  - Monitor BP at home and follow up in one week for BP check and BMP  - Return precautions reviewed   2. Cough - likely due to [place], NP

## 2018-09-29 ENCOUNTER — Ambulatory Visit (INDEPENDENT_AMBULATORY_CARE_PROVIDER_SITE_OTHER): Payer: PPO | Admitting: Adult Health

## 2018-09-29 ENCOUNTER — Encounter: Payer: Self-pay | Admitting: Adult Health

## 2018-09-29 VITALS — BP 122/74 | Temp 98.3°F | Wt 146.0 lb

## 2018-09-29 DIAGNOSIS — I10 Essential (primary) hypertension: Secondary | ICD-10-CM

## 2018-09-29 LAB — BASIC METABOLIC PANEL
BUN: 18 mg/dL (ref 6–23)
CALCIUM: 9.4 mg/dL (ref 8.4–10.5)
CHLORIDE: 98 meq/L (ref 96–112)
CO2: 32 mEq/L (ref 19–32)
CREATININE: 0.72 mg/dL (ref 0.40–1.20)
GFR: 80.65 mL/min (ref >60.00)
Glucose, Bld: 82 mg/dL (ref 70–99)
Potassium: 4.1 mEq/L (ref 3.5–5.1)
Sodium: 139 mEq/L (ref 135–145)

## 2018-09-29 NOTE — Progress Notes (Signed)
Subjective:    Patient ID: Patricia Solis, female    DOB: 1950/11/21, 68 y.o.   MRN: [id number]  HPI 68 year old female who  has a past medical history of Basal cell carcinoma (2007), Breast cancer ([place]), Carotid bruit (08/2008), DUB (dysfunctional uterine bleeding), Esophagitis, HTN (hypertension), Hyperlipidemia, Hypothyroidism, MHA (microangiopathic hemolytic anemia) ([place]), Personal history of chemotherapy, Personal history of radiation therapy, and Spinal stenosis.  She presents to the office today for follow up regarding hypertension. During her last visit Norvasc was stopped due to headache and at this time HCTZ was increased to 25 mg.   Today in the office she reports that she has been monitoring his blood pressure at home and reports readings in the 110-130's/70-80's.   She denies headaches, blurred vision, or dizziness.    Review of Systems See HPI   Past Medical History:  Diagnosis Date  . Basal cell carcinoma 2007   BCC  . Breast cancer ([place])    Invasive lobular carcinoma RIGHT breast  . Carotid bruit 08/2008   Normal Doppler  . DUB (dysfunctional uterine bleeding)   . Esophagitis   . HTN (hypertension)    Tx started 06/2010  . Hyperlipidemia   . Hypothyroidism   . MHA (microangiopathic hemolytic anemia) ([facility])   . Personal history of chemotherapy    2003  . Personal history of radiation therapy    2003 right breast  . Spinal stenosis     Social History   Socioeconomic History  . Marital status: Married    Spouse name: Not on file  . Number of children: Not on file  . Years of education: Not on file  . Highest education level: Not on file  Occupational History  . Not on file  Social Needs  . Financial resource strain: Not on file  . Food insecurity:    Worry: Not on file    Inability: Not on file  . Transportation needs:    Medical: Not on file    Non-medical: Not on file  Tobacco Use  . Smoking status: Never Smoker  . Smokeless tobacco: Never Used    Substance and Sexual Activity  . Alcohol use: No  . Drug use: No  . Sexual activity: Not on file  Lifestyle  . Physical activity:    Days per week: Not on file    Minutes per session: Not on file  . Stress: Not on file  Relationships  . Social connections:    Talks on phone: Not on file    Gets together: Not on file    Attends religious service: Not on file    Active member of club or organization: Not on file    Attends meetings of clubs or organizations: Not on file    Relationship status: Not on file  . Intimate partner violence:    Fear of current or ex partner: Not on file    Emotionally abused: Not on file    Physically abused: Not on file    Forced sexual activity: Not on file  Other Topics Concern  . Not on file  Social History Narrative   Retired in March    Married for 59 years    One daughter, lives in [place]    Has four grandchildren      Likes to play with grand kids, go to [employer] and mountains.        Past Surgical History:  Procedure Laterality Date  . BREAST BIOPSY Right  2007 benign  . BREAST LUMPECTOMY Right    2003  . BTL    . CATARACT EXTRACTION, BILATERAL Bilateral 03/26/2016   still blurred vision; Md states this is fine  . CERVICAL LAMINECTOMY     Left  . FOOT SURGERY    . MASTECTOMY W/ NODES PARTIAL     Right  . SHOULDER ARTHROSCOPY Right   . TUBAL LIGATION  1981    Family History  Problem Relation Age of Onset  . Heart attack Father   . Coronary artery disease Father   . Diabetes Father   . Coronary artery disease Mother   . Leukemia Mother   . Thyroid disease Mother   . Multiple sclerosis Sister        Twin  . Breast cancer Maternal Aunt     Allergies  Allergen Reactions  . Ambien Cr [Zolpidem] Other (See Comments)    Hallucinations  . Cozaar [Losartan] Cough  . Erythromycin     REACTION: rash  . Hydrocodone Other (See Comments)    Hallucinations  . Lisinopril Cough  . Penicillins     REACTION: rash  .  Clindamycin/Lincomycin Rash    Current Outpatient Medications on File Prior to Visit  Medication Sig Dispense Refill  . aspirin 81 MG EC tablet Take 81 mg by mouth daily.      . calcium carbonate (OS-CAL) 600 MG TABS tablet Take 1 tablet (600 mg total) by mouth 2 (two) times daily with a meal. 180 tablet 3  . GLUCOSAMINE PO Take 1 tablet by mouth 2 (two) times daily.    . hydrochlorothiazide (MICROZIDE) 12.5 MG capsule Take 1 capsule (12.5 mg total) by mouth daily. (Patient taking differently: Take 25 mg by mouth daily. ) 90 capsule 3  . levothyroxine (SYNTHROID) 88 MCG tablet TAKE 1 TABLET DAILY 90 tablet 3  . omeprazole (PRILOSEC) 20 MG capsule TAKE 1 BY MOUTH DAILY 90 capsule 3  . simvastatin (ZOCOR) 40 MG tablet Take 1 tablet by mouth every night at bedtime 90 tablet 3   No current facility-administered medications on file prior to visit.     BP 122/74   Temp 98.3 F (36.8 C)   Wt 146 lb (66.2 kg)   BMI 27.59 kg/m       Objective:   Physical Exam Vitals signs reviewed.  Constitutional:      Appearance: Normal appearance.  Cardiovascular:     Rate and Rhythm: Normal rate and regular rhythm.     Pulses: Normal pulses.     Heart sounds: Normal heart sounds.  Pulmonary:     Effort: Pulmonary effort is normal.     Breath sounds: Normal breath sounds.  Skin:    Capillary Refill: Capillary refill takes less than 2 seconds.  Neurological:     General: No focal deficit present.     Mental Status: She is alert and oriented to person, place, and time. Mental status is at baseline.       Assessment & Plan:  1. Essential hypertension - Well controlled on HCTZ 25 mg  - Basic Metabolic Panel   Dorothyann Peng

## 2018-10-01 ENCOUNTER — Other Ambulatory Visit: Payer: Self-pay | Admitting: Adult Health

## 2018-10-01 DIAGNOSIS — Z760 Encounter for issue of repeat prescription: Secondary | ICD-10-CM

## 2018-10-01 DIAGNOSIS — I10 Essential (primary) hypertension: Secondary | ICD-10-CM

## 2018-10-01 MED ORDER — HYDROCHLOROTHIAZIDE 25 MG PO TABS: 25.0000 mg | ORAL_TABLET | Freq: Every day | ORAL | 3 refills | Status: DC

## 2018-10-10 DIAGNOSIS — Z23 Encounter for immunization: Secondary | ICD-10-CM | POA: Diagnosis not present

## 2018-10-10 DIAGNOSIS — L570 Actinic keratosis: Secondary | ICD-10-CM | POA: Diagnosis not present

## 2018-10-31 ENCOUNTER — Ambulatory Visit: Payer: PPO

## 2018-11-01 ENCOUNTER — Encounter: Payer: Self-pay | Admitting: Adult Health

## 2018-11-01 ENCOUNTER — Other Ambulatory Visit: Payer: Self-pay

## 2018-11-01 ENCOUNTER — Ambulatory Visit (INDEPENDENT_AMBULATORY_CARE_PROVIDER_SITE_OTHER): Payer: PPO | Admitting: Adult Health

## 2018-11-01 DIAGNOSIS — W57XXXA Bitten or stung by nonvenomous insect and other nonvenomous arthropods, initial encounter: Secondary | ICD-10-CM | POA: Diagnosis not present

## 2018-11-01 DIAGNOSIS — S60461A Insect bite (nonvenomous) of left index finger, initial encounter: Secondary | ICD-10-CM

## 2018-11-01 MED ORDER — DOXYCYCLINE HYCLATE 100 MG PO CAPS: 100.0000 mg | ORAL_CAPSULE | Freq: Two times a day (BID) | ORAL | 0 refills | Status: DC

## 2018-11-01 NOTE — Telephone Encounter (Signed)
Pt scheduled to see Tommi Rumps today at 9:30.  Nothing further needed at this time.

## 2018-11-01 NOTE — Progress Notes (Signed)
Virtual Visit via Video Note  I connected with Patricia Solis on 11/01/18 at  9:30 AM EDT by a video enabled telemedicine application and verified that I am speaking with the correct person using two identifiers.  Location patient: home Location provider:work or home office Persons participating in the virtual visit: patient, provider  I discussed the limitations of evaluation and management by telemedicine and the availability of in person appointments. The patient expressed understanding and agreed to proceed.   HPI: 68 year old female who evaluated today for concern of insect bite to left pointer finger.  She reports yesterday that she woke up and noticed itching and burning to her pointer finger and into her wrist.  She noticed 2 red spots on her pointer finger and swelling along the finger and thumb.  She went to the pharmacy was advised hydrocortisone cream and Benadryl which she felt as though the cortisone cream made the swelling worse.  She continues to have a burning and itching sensation.  Has no loss of range of motion.  Has not noticed any drainage from the wounds.  Has no loss of range of motion. denies fevers or chills   ROS: See pertinent positives and negatives per HPI.  Past Medical History:  Diagnosis Date  . Basal cell carcinoma 2007   BCC  . Breast cancer ([place])    Invasive lobular carcinoma RIGHT breast  . Carotid bruit 08/2008   Normal Doppler  . DUB (dysfunctional uterine bleeding)   . Esophagitis   . HTN (hypertension)    Tx started 06/2010  . Hyperlipidemia   . Hypothyroidism   . MHA (microangiopathic hemolytic anemia) ([facility])   . Personal history of chemotherapy    2003  . Personal history of radiation therapy    2003 right breast  . Spinal stenosis     Past Surgical History:  Procedure Laterality Date  . BREAST BIOPSY Right    2007 benign  . BREAST LUMPECTOMY Right    2003  . BTL    . CATARACT EXTRACTION, BILATERAL Bilateral 03/26/2016   still  blurred vision; Md states this is fine  . CERVICAL LAMINECTOMY     Left  . FOOT SURGERY    . MASTECTOMY W/ NODES PARTIAL     Right  . SHOULDER ARTHROSCOPY Right   . TUBAL LIGATION  1981    Family History  Problem Relation Age of Onset  . Heart attack Father   . Coronary artery disease Father   . Diabetes Father   . Coronary artery disease Mother   . Leukemia Mother   . Thyroid disease Mother   . Multiple sclerosis Sister        Twin  . Breast cancer Maternal Aunt       Current Outpatient Medications:  .  aspirin 81 MG EC tablet, Take 81 mg by mouth daily.  , Disp: , Rfl:  .  calcium carbonate (OS-CAL) 600 MG TABS tablet, Take 1 tablet (600 mg total) by mouth 2 (two) times daily with a meal., Disp: 180 tablet, Rfl: 3 .  doxycycline (VIBRAMYCIN) 100 MG capsule, Take 1 capsule (100 mg total) by mouth 2 (two) times daily., Disp: 14 capsule, Rfl: 0 .  GLUCOSAMINE PO, Take 1 tablet by mouth 2 (two) times daily., Disp: , Rfl:  .  hydrochlorothiazide (HYDRODIURIL) 25 MG tablet, Take 1 tablet (25 mg total) by mouth daily., Disp: 90 tablet, Rfl: 3 .  levothyroxine (SYNTHROID) 88 MCG tablet, TAKE 1 TABLET DAILY, Disp: 90  tablet, Rfl: 3 .  omeprazole (PRILOSEC) 20 MG capsule, TAKE 1 BY MOUTH DAILY, Disp: 90 capsule, Rfl: 3 .  simvastatin (ZOCOR) 40 MG tablet, Take 1 tablet by mouth every night at bedtime, Disp: 90 tablet, Rfl: 3  EXAM:  VITALS per patient if applicable:  GENERAL: alert, oriented, appears well and in no acute distress  HEENT: atraumatic, conjunttiva clear, no obvious abnormalities on inspection of external nose and ears  NECK: normal movements of the head and neck  LUNGS: on inspection no signs of respiratory distress, breathing rate appears normal, no obvious gross SOB, gasping or wheezing  CV: no obvious cyanosis  MS: moves all visible extremities without noticeable abnormality.   PSYCH/NEURO: pleasant and cooperative, no obvious depression or anxiety, speech  and thought processing grossly intact  SKIN: She does have what appears to be 2 small insect bite marks on her left pointer finger -first one  over theThe PIP joint and the second one between PIP and MCP joint.  She also does have some noticeable soft tissue swelling to the distal aspect of the left pointer finger that radiates to the thumb and radial aspect of  Wrist.   ASSESSMENT AND PLAN:  Discussed the following assessment and plan:  Insect bite of left index finger, initial encounter Cover with doxycycline for possible cellulitis due to insect bite.  Advised to monitor closely if she noticed any streaking or worsening of symptoms she is to follow-up immediately.    I discussed the assessment and treatment plan with the patient. The patient was provided an opportunity to ask questions and all were answered. The patient agreed with the plan and demonstrated an understanding of the instructions.   The patient was advised to call back or seek an in-person evaluation if the symptoms worsen or if the condition fails to improve as anticipated.   Dorothyann Peng, NP

## 2018-11-09 DIAGNOSIS — S40862D Insect bite (nonvenomous) of left upper arm, subsequent encounter: Secondary | ICD-10-CM | POA: Diagnosis not present

## 2018-11-23 DIAGNOSIS — L249 Irritant contact dermatitis, unspecified cause: Secondary | ICD-10-CM | POA: Diagnosis not present

## 2018-12-12 ENCOUNTER — Encounter: Payer: Self-pay | Admitting: Adult Health

## 2018-12-12 ENCOUNTER — Ambulatory Visit: Payer: PPO

## 2018-12-12 NOTE — Telephone Encounter (Signed)
Cory please advise. Thanks

## 2019-01-18 DIAGNOSIS — L570 Actinic keratosis: Secondary | ICD-10-CM | POA: Diagnosis not present

## 2019-01-18 DIAGNOSIS — D225 Melanocytic nevi of trunk: Secondary | ICD-10-CM | POA: Diagnosis not present

## 2019-01-18 DIAGNOSIS — T148XXA Other injury of unspecified body region, initial encounter: Secondary | ICD-10-CM | POA: Diagnosis not present

## 2019-01-18 DIAGNOSIS — D2371 Other benign neoplasm of skin of right lower limb, including hip: Secondary | ICD-10-CM | POA: Diagnosis not present

## 2019-01-18 DIAGNOSIS — Z85828 Personal history of other malignant neoplasm of skin: Secondary | ICD-10-CM | POA: Diagnosis not present

## 2019-01-18 DIAGNOSIS — L821 Other seborrheic keratosis: Secondary | ICD-10-CM | POA: Diagnosis not present

## 2019-01-22 ENCOUNTER — Ambulatory Visit
Admission: RE | Admit: 2019-01-22 | Discharge: 2019-01-22 | Disposition: A | Discharge status: Home / Self Care | Payer: PPO | Source: Ambulatory Visit | Attending: Obstetrics and Gynecology | Admitting: Obstetrics and Gynecology

## 2019-01-22 ENCOUNTER — Other Ambulatory Visit: Payer: Self-pay

## 2019-01-22 DIAGNOSIS — Z1231 Encounter for screening mammogram for malignant neoplasm of breast: Secondary | ICD-10-CM | POA: Diagnosis not present

## 2019-02-15 DIAGNOSIS — L570 Actinic keratosis: Secondary | ICD-10-CM | POA: Diagnosis not present

## 2019-02-15 DIAGNOSIS — Z6827 Body mass index (BMI) 27.0-27.9, adult: Secondary | ICD-10-CM | POA: Diagnosis not present

## 2019-02-15 DIAGNOSIS — Z01419 Encounter for gynecological examination (general) (routine) without abnormal findings: Secondary | ICD-10-CM | POA: Diagnosis not present

## 2019-03-05 ENCOUNTER — Encounter: Payer: Self-pay | Admitting: Adult Health

## 2019-03-06 ENCOUNTER — Encounter: Payer: Self-pay | Admitting: Adult Health

## 2019-05-09 ENCOUNTER — Encounter: Payer: PPO | Admitting: Adult Health

## 2019-05-11 ENCOUNTER — Ambulatory Visit (INDEPENDENT_AMBULATORY_CARE_PROVIDER_SITE_OTHER): Payer: PPO | Admitting: Adult Health

## 2019-05-11 ENCOUNTER — Encounter: Payer: Self-pay | Admitting: Adult Health

## 2019-05-11 ENCOUNTER — Telehealth: Payer: Self-pay | Admitting: Adult Health

## 2019-05-11 ENCOUNTER — Other Ambulatory Visit: Payer: Self-pay | Admitting: Adult Health

## 2019-05-11 ENCOUNTER — Other Ambulatory Visit: Payer: Self-pay

## 2019-05-11 VITALS — BP 130/64 | Temp 97.9°F | Ht 61.0 in | Wt 148.0 lb

## 2019-05-11 DIAGNOSIS — E782 Mixed hyperlipidemia: Secondary | ICD-10-CM | POA: Diagnosis not present

## 2019-05-11 DIAGNOSIS — Z760 Encounter for issue of repeat prescription: Secondary | ICD-10-CM

## 2019-05-11 DIAGNOSIS — I10 Essential (primary) hypertension: Secondary | ICD-10-CM

## 2019-05-11 DIAGNOSIS — Z0000 Encounter for general adult medical examination without abnormal findings: Secondary | ICD-10-CM

## 2019-05-11 DIAGNOSIS — M79605 Pain in left leg: Secondary | ICD-10-CM | POA: Diagnosis not present

## 2019-05-11 DIAGNOSIS — Z23 Encounter for immunization: Secondary | ICD-10-CM

## 2019-05-11 DIAGNOSIS — E039 Hypothyroidism, unspecified: Secondary | ICD-10-CM

## 2019-05-11 LAB — TSH: TSH: 0.15 u[IU]/mL — ABNORMAL LOW (ref 0.35–4.50)

## 2019-05-11 LAB — COMPREHENSIVE METABOLIC PANEL
ALT: 19 U/L (ref 0–35)
AST: 15 U/L (ref 0–37)
Albumin: 4.5 g/dL (ref 3.5–5.2)
Alkaline Phosphatase: 71 U/L (ref 39–117)
BUN: 20 mg/dL (ref 6–23)
CO2: 31 mEq/L (ref 19–32)
Calcium: 9.5 mg/dL (ref 8.4–10.5)
Chloride: 101 mEq/L (ref 96–112)
Creatinine, Ser: 0.58 mg/dL (ref 0.40–1.20)
GFR: 103.32 mL/min (ref >60.00)
Glucose, Bld: 93 mg/dL (ref 70–99)
Potassium: 3.8 mEq/L (ref 3.5–5.1)
Sodium: 142 mEq/L (ref 135–145)
Total Bilirubin: 0.5 mg/dL (ref 0.2–1.2)
Total Protein: 6.9 g/dL (ref 6.0–8.3)

## 2019-05-11 LAB — CBC WITH DIFFERENTIAL/PLATELET
Basophils Absolute: 0.1 10*3/uL (ref 0.0–0.1)
Basophils Relative: 1.1 % (ref 0.0–3.0)
Eosinophils Absolute: 0.2 10*3/uL (ref 0.0–0.7)
Eosinophils Relative: 3.3 % (ref 0.0–5.0)
HCT: 42.1 % (ref 36.0–46.0)
Hemoglobin: 14.2 g/dL (ref 12.0–15.0)
Lymphocytes Relative: 31.2 % (ref 12.0–46.0)
Lymphs Abs: 1.9 10*3/uL (ref 0.7–4.0)
MCHC: 33.6 g/dL (ref 30.0–36.0)
MCV: 91.8 fl (ref 78.0–100.0)
Monocytes Absolute: 0.4 10*3/uL (ref 0.1–1.0)
Monocytes Relative: 6.7 % (ref 3.0–12.0)
Neutro Abs: 3.6 10*3/uL (ref 1.4–7.7)
Neutrophils Relative %: 57.7 % (ref 43.0–77.0)
Platelets: 304 10*3/uL (ref 150.0–400.0)
RBC: 4.58 Mil/uL (ref 3.87–5.11)
RDW: 13.9 % (ref 11.5–15.5)
WBC: 6.2 10*3/uL (ref 4.0–10.5)

## 2019-05-11 LAB — LIPID PANEL
Cholesterol: 226 mg/dL — ABNORMAL HIGH (ref 0–200)
HDL: 68.7 mg/dL (ref >39.00)
LDL Cholesterol: 141 mg/dL — ABNORMAL HIGH (ref 0–99)
NonHDL: 157.4
Total CHOL/HDL Ratio: 3
Triglycerides: 81 mg/dL (ref 0.0–149.0)
VLDL: 16.2 mg/dL (ref 0.0–40.0)

## 2019-05-11 MED ORDER — METHYLPREDNISOLONE 4 MG PO TBPK: ORAL_TABLET | ORAL | 0 refills | Status: DC

## 2019-05-11 MED ORDER — LEVOTHYROXINE SODIUM 75 MCG PO TABS: ORAL_TABLET | ORAL | 3 refills | Status: DC

## 2019-05-11 MED ORDER — SIMVASTATIN 40 MG PO TABS: ORAL_TABLET | ORAL | 3 refills | Status: DC

## 2019-05-11 MED ORDER — OMEPRAZOLE 20 MG PO CPDR: DELAYED_RELEASE_CAPSULE | ORAL | 3 refills | Status: DC

## 2019-05-11 NOTE — Progress Notes (Signed)
Subjective:    Patient ID: Patricia Solis, female    DOB: 1951/05/16, 68 y.o.   MRN: [id number]  HPI Patient presents for yearly preventative medicine examination. He is a pleasant 68 year old female who  has a past medical history of Basal cell carcinoma (2007), Breast cancer ([place]), Carotid bruit (08/2008), DUB (dysfunctional uterine bleeding), Esophagitis, HTN (hypertension), Hyperlipidemia, Hypothyroidism, MHA (microangiopathic hemolytic anemia) ([place]), Personal history of chemotherapy, Personal history of radiation therapy, and Spinal stenosis.  Hypertension-prescribed HCTZ 12.5 mg and Cozaar 25 mg.  She denies dizziness, lightheadedness, chest pain, or shortness of breath.  She does monitor her blood pressure at home and continuously gets readings between 120 and 130 over 70s to 80s BP Readings from Last 3 Encounters:  05/11/19 130/64  09/29/18 122/74  09/22/18 122/80   Hypothyroidism-Synthroid 88 mcg daily Lab Results  Component Value Date   TSH 0.55 05/02/2018   Hyperlipidemia-takes simvastatin 40 mg.  She denies myalgia or fatigue  Lab Results  Component Value Date   CHOL 198 05/02/2018   HDL 66.50 05/02/2018   LDLCALC 120 (H) 05/02/2018   LDLDIRECT 140.4 09/02/2006   TRIG 57.0 05/02/2018   CHOLHDL 3 05/02/2018    GERD-controlled with Prilosec  Acute Issue  - Left Leg pain -has been present intermittently over the last month.  She will experience pain described as a "aching" on the outside of her left leg.  This pain last 3 to 4 minutes and then resolves.  Pain is worse with walking and sitting.  She denies trauma or aggravating injury.  She has no left-sided calf pain, bruising, or worsening edema.  He has tried Biofreeze and Motrin without significant improvement  All immunizations and health maintenance protocols were reviewed with the patient and needed orders were placed. She is due for flu vaccination   Appropriate screening laboratory values were ordered for the  patient including screening of hyperlipidemia, renal function and hepatic function.  Medication reconciliation,  past medical history, social history, problem list and allergies were reviewed in detail with the patient  Goals were established with regard to weight loss, exercise, and  diet in compliance with medications.  She is back to working out at the gym and is eating a heart healthy diet  Wt Readings from Last 3 Encounters:  05/11/19 148 lb (67.1 kg)  09/29/18 146 lb (66.2 kg)  09/22/18 146 lb (66.2 kg)     End of life planning was discussed.  She is up-to-date on routine screening dental and vision exams.    Review of Systems  Constitutional: Negative.   HENT: Negative.   Eyes: Negative.   Respiratory: Negative.   Cardiovascular: Negative.   Gastrointestinal: Negative.   Endocrine: Negative.   Genitourinary: Negative.   Musculoskeletal: Positive for myalgias.  Skin: Negative.   Allergic/Immunologic: Negative.   Neurological: Negative.   Hematological: Negative.   Psychiatric/Behavioral: Negative.    Past Medical History:  Diagnosis Date  . Basal cell carcinoma 2007   BCC  . Breast cancer ([place])    Invasive lobular carcinoma RIGHT breast  . Carotid bruit 08/2008   Normal Doppler  . DUB (dysfunctional uterine bleeding)   . Esophagitis   . HTN (hypertension)    Tx started 06/2010  . Hyperlipidemia   . Hypothyroidism   . MHA (microangiopathic hemolytic anemia) ([facility])   . Personal history of chemotherapy    2003  . Personal history of radiation therapy    2003 right breast  . Spinal  stenosis     Social History   Socioeconomic History  . Marital status: Married    Spouse name: Not on file  . Number of children: Not on file  . Years of education: Not on file  . Highest education level: Not on file  Occupational History  . Not on file  Social Needs  . Financial resource strain: Not on file  . Food insecurity    Worry: Not on file    Inability: Not on  file  . Transportation needs    Medical: Not on file    Non-medical: Not on file  Tobacco Use  . Smoking status: Never Smoker  . Smokeless tobacco: Never Used  Substance and Sexual Activity  . Alcohol use: No  . Drug use: No  . Sexual activity: Not on file  Lifestyle  . Physical activity    Days per week: Not on file    Minutes per session: Not on file  . Stress: Not on file  Relationships  . Social Herbalist on phone: Not on file    Gets together: Not on file    Attends religious service: Not on file    Active member of club or organization: Not on file    Attends meetings of clubs or organizations: Not on file    Relationship status: Not on file  . Intimate partner violence    Fear of current or ex partner: Not on file    Emotionally abused: Not on file    Physically abused: Not on file    Forced sexual activity: Not on file  Other Topics Concern  . Not on file  Social History Narrative   Retired in March    Married for 73 years    One daughter, lives in [place]    Has four grandchildren      Likes to play with grand kids, go to [employer] and mountains.        Past Surgical History:  Procedure Laterality Date  . BREAST BIOPSY Right    2007 benign  . BREAST LUMPECTOMY Right    2003  . BTL    . CATARACT EXTRACTION, BILATERAL Bilateral 03/26/2016   still blurred vision; Md states this is fine  . CERVICAL LAMINECTOMY     Left  . FOOT SURGERY    . MASTECTOMY W/ NODES PARTIAL     Right  . SHOULDER ARTHROSCOPY Right   . TUBAL LIGATION  1981    Family History  Problem Relation Age of Onset  . Heart attack Father   . Coronary artery disease Father   . Diabetes Father   . Coronary artery disease Mother   . Leukemia Mother   . Thyroid disease Mother   . Multiple sclerosis Sister        Twin  . Breast cancer Maternal Aunt     Allergies  Allergen Reactions  . Ambien Cr [Zolpidem] Other (See Comments)    Hallucinations  . Cozaar [Losartan] Cough   . Erythromycin     REACTION: rash  . Hydrocodone Other (See Comments)    Hallucinations  . Lisinopril Cough  . Penicillins     REACTION: rash  . Clindamycin/Lincomycin Rash    Current Outpatient Medications on File Prior to Visit  Medication Sig Dispense Refill  . aspirin 81 MG EC tablet Take 81 mg by mouth daily.      . calcium carbonate (OS-CAL) 600 MG TABS tablet Take 1 tablet (600  mg total) by mouth 2 (two) times daily with a meal. 180 tablet 3  . GLUCOSAMINE PO Take 1 tablet by mouth 2 (two) times daily.    . hydrochlorothiazide (HYDRODIURIL) 25 MG tablet Take 1 tablet (25 mg total) by mouth daily. 90 tablet 3  . levothyroxine (SYNTHROID) 88 MCG tablet TAKE 1 TABLET DAILY 90 tablet 3  . omeprazole (PRILOSEC) 20 MG capsule TAKE 1 BY MOUTH DAILY 90 capsule 3  . simvastatin (ZOCOR) 40 MG tablet Take 1 tablet by mouth every night at bedtime 90 tablet 3   No current facility-administered medications on file prior to visit.     BP 130/64   Temp 97.9 F (36.6 C) (Temporal)   Ht 5\' 1"  (1.549 m) Comment: WITHOUT SHOES  Wt 148 lb (67.1 kg)   BMI 27.96 kg/m       Objective:   Physical Exam Vitals signs and nursing note reviewed.  Constitutional:      General: She is not in acute distress.    Appearance: Normal appearance. She is overweight. She is not diaphoretic.  HENT:     Head: Normocephalic and atraumatic.     Right Ear: Tympanic membrane, ear canal and external ear normal. There is no impacted cerumen.     Left Ear: Tympanic membrane, ear canal and external ear normal. There is no impacted cerumen.     Nose: Nose normal. No congestion or rhinorrhea.     Mouth/Throat:     Mouth: Mucous membranes are moist.     Pharynx: Oropharynx is clear. No oropharyngeal exudate.  Eyes:     General: No scleral icterus.       Right eye: No discharge.        Left eye: No discharge.     Extraocular Movements: Extraocular movements intact.     Conjunctiva/sclera: Conjunctivae normal.      Pupils: Pupils are equal, round, and reactive to light.  Neck:     Musculoskeletal: Normal range of motion and neck supple.     Thyroid: No thyromegaly.     Vascular: No JVD.     Trachea: No tracheal deviation.  Cardiovascular:     Rate and Rhythm: Normal rate and regular rhythm.     Pulses: Normal pulses.     Heart sounds: Normal heart sounds. No murmur. No friction rub. No gallop.   Pulmonary:     Effort: Pulmonary effort is normal. No respiratory distress.     Breath sounds: Normal breath sounds. No stridor. No wheezing, rhonchi or rales.  Chest:     Chest wall: No tenderness.  Abdominal:     General: Bowel sounds are normal. There is no distension.     Palpations: Abdomen is soft. There is no mass.     Tenderness: There is no abdominal tenderness. There is no right CVA tenderness, left CVA tenderness, guarding or rebound.     Hernia: No hernia is present.  Musculoskeletal: Normal range of motion.        General: Tenderness (She has tenderness to the head of the tibialis anterior muscle) present. No swelling, deformity or signs of injury.     Right lower leg: No edema.     Left lower leg: No edema.  Lymphadenopathy:     Cervical: No cervical adenopathy.  Skin:    General: Skin is warm and dry.     Capillary Refill: Capillary refill takes less than 2 seconds.     Coloration: Skin is not jaundiced or pale.  Findings: No bruising, erythema, lesion or rash.  Neurological:     General: No focal deficit present.     Mental Status: She is alert and oriented to person, place, and time.     Cranial Nerves: No cranial nerve deficit.     Sensory: No sensory deficit.     Motor: No weakness or abnormal muscle tone.     Coordination: Coordination normal.     Gait: Gait normal.     Deep Tendon Reflexes: Reflexes normal.  Psychiatric:        Mood and Affect: Mood normal.        Behavior: Behavior normal.        Thought Content: Thought content normal.        Judgment: Judgment  normal.       Assessment & Plan:  1. Routine general medical examination at a health care facility - Continue to exercise and eat healthy  - Follow up in one year or sooner if needed - CBC with Differential/Platelet - Comprehensive metabolic panel - Lipid panel - TSH  2. Essential hypertension - Well controlled. No change in medications  - CBC with Differential/Platelet - Comprehensive metabolic panel - Lipid panel - TSH  3. Hypothyroidism, unspecified type - Consider increase in synthroid  - CBC with Differential/Platelet - Comprehensive metabolic panel - Lipid panel - TSH  4. Mixed hyperlipidemia - Consider increase in statin  - CBC with Differential/Platelet - Comprehensive metabolic panel - Lipid panel - TSH  5. Need for vaccination  - Flu Vaccine QUAD High Dose(Fluad)  6. Left leg pain -Muscle or ligament strain.  Advised Motrin every 8 hours for the next 4 to 5 days.  Ice, and will send in Medrol Dosepak to help with inflammation.  She was advised to follow-up if no improvement - methylPREDNISolone (MEDROL DOSEPAK) 4 MG TBPK tablet; Take as directed  Dispense: 21 tablet; Refill: 0   Dorothyann Peng, NP

## 2019-05-11 NOTE — Telephone Encounter (Signed)
Spoke to patient and informed her of her labs.  Her cholesterol panel did increase over the last year we will have her concentrate on lifestyle modifications and continue with simvastatin.  I am going to adjust her Synthroid level from 88 mcg to 75 mcg daily

## 2019-05-16 ENCOUNTER — Telehealth: Payer: Self-pay | Admitting: Adult Health

## 2019-05-16 NOTE — Telephone Encounter (Signed)
Copied from [place] [id number]. Topic: General - Other >> May 16, 2019  4:19 PM Keene Breath wrote: Reason for CRM: Called because they have a questions regarding patient's medication for levothyroxine (SYNTHROID) 75 MCG tablet.  Please call to discuss at [phone]

## 2019-05-17 NOTE — Telephone Encounter (Signed)
Left a message for Patricia Solis to return my call.

## 2019-05-17 NOTE — Telephone Encounter (Signed)
Will they cover name brand synthroid?

## 2019-05-17 NOTE — Telephone Encounter (Signed)
Patricia Solis with [facility] states they need to confirm the change in dosage, and if OK to change the manufacture to [employer] brand.  Pt not previously on that brand.  Patricia Solis direct line:  [phone]

## 2019-05-18 NOTE — Telephone Encounter (Signed)
Left a message on Patricia Solis's voicemail informed that dosage was changed from 88 mcg to 75 mcg and ok to change different generic brands.  Advised a call back if needed.

## 2019-06-04 ENCOUNTER — Encounter: Payer: Self-pay | Admitting: Adult Health

## 2019-06-05 ENCOUNTER — Encounter: Payer: Self-pay | Admitting: Adult Health

## 2019-06-06 ENCOUNTER — Encounter: Payer: Self-pay | Admitting: Adult Health

## 2019-06-19 ENCOUNTER — Other Ambulatory Visit: Payer: Self-pay

## 2019-07-09 ENCOUNTER — Encounter: Payer: Self-pay | Admitting: Adult Health

## 2019-07-09 ENCOUNTER — Other Ambulatory Visit: Payer: Self-pay

## 2019-07-09 ENCOUNTER — Emergency Department (HOSPITAL_COMMUNITY): Payer: PPO

## 2019-07-09 ENCOUNTER — Encounter (HOSPITAL_COMMUNITY): Payer: Self-pay | Admitting: *Deleted

## 2019-07-09 ENCOUNTER — Emergency Department (HOSPITAL_COMMUNITY)
Admission: EM | Admit: 2019-07-09 | Discharge: 2019-07-09 | Disposition: A | Discharge status: Home / Self Care | Payer: PPO | Attending: Emergency Medicine | Admitting: Emergency Medicine

## 2019-07-09 DIAGNOSIS — R079 Chest pain, unspecified: Secondary | ICD-10-CM

## 2019-07-09 DIAGNOSIS — Z85828 Personal history of other malignant neoplasm of skin: Secondary | ICD-10-CM | POA: Insufficient documentation

## 2019-07-09 DIAGNOSIS — I10 Essential (primary) hypertension: Secondary | ICD-10-CM | POA: Diagnosis not present

## 2019-07-09 DIAGNOSIS — E039 Hypothyroidism, unspecified: Secondary | ICD-10-CM | POA: Insufficient documentation

## 2019-07-09 DIAGNOSIS — R000 Tachycardia, unspecified: Secondary | ICD-10-CM | POA: Insufficient documentation

## 2019-07-09 DIAGNOSIS — M546 Pain in thoracic spine: Secondary | ICD-10-CM | POA: Diagnosis not present

## 2019-07-09 DIAGNOSIS — Z79899 Other long term (current) drug therapy: Secondary | ICD-10-CM | POA: Diagnosis not present

## 2019-07-09 DIAGNOSIS — R1013 Epigastric pain: Secondary | ICD-10-CM | POA: Insufficient documentation

## 2019-07-09 DIAGNOSIS — Z7982 Long term (current) use of aspirin: Secondary | ICD-10-CM | POA: Insufficient documentation

## 2019-07-09 DIAGNOSIS — Z853 Personal history of malignant neoplasm of breast: Secondary | ICD-10-CM | POA: Diagnosis not present

## 2019-07-09 DIAGNOSIS — R0602 Shortness of breath: Secondary | ICD-10-CM | POA: Diagnosis not present

## 2019-07-09 DIAGNOSIS — R0789 Other chest pain: Secondary | ICD-10-CM | POA: Diagnosis not present

## 2019-07-09 LAB — I-STAT CHEM 8, ED
BUN: 22 mg/dL (ref 8–23)
Calcium, Ion: 1.13 mmol/L — ABNORMAL LOW (ref 1.15–1.40)
Chloride: 100 mmol/L (ref 98–111)
Creatinine, Ser: 0.7 mg/dL (ref 0.44–1.00)
Glucose, Bld: 103 mg/dL — ABNORMAL HIGH (ref 70–99)
HCT: 41 % (ref 36.0–46.0)
Hemoglobin: 13.9 g/dL (ref 12.0–15.0)
Potassium: 2.9 mmol/L — ABNORMAL LOW (ref 3.5–5.1)
Sodium: 139 mmol/L (ref 135–145)
TCO2: 29 mmol/L (ref 22–32)

## 2019-07-09 LAB — CBC
HCT: 41.8 % (ref 36.0–46.0)
Hemoglobin: 13.8 g/dL (ref 12.0–15.0)
MCH: 30.7 pg (ref 26.0–34.0)
MCHC: 33 g/dL (ref 30.0–36.0)
MCV: 92.9 fL (ref 80.0–100.0)
Platelets: 289 10*3/uL (ref 150–400)
RBC: 4.5 MIL/uL (ref 3.87–5.11)
RDW: 13.6 % (ref 11.5–15.5)
WBC: 11 10*3/uL — ABNORMAL HIGH (ref 4.0–10.5)
nRBC: 0 % (ref 0.0–0.2)

## 2019-07-09 LAB — HEPATIC FUNCTION PANEL
ALT: 84 U/L — ABNORMAL HIGH (ref 0–44)
AST: 137 U/L — ABNORMAL HIGH (ref 15–41)
Albumin: 3.7 g/dL (ref 3.5–5.0)
Alkaline Phosphatase: 79 U/L (ref 38–126)
Bilirubin, Direct: 0.1 mg/dL (ref 0.0–0.2)
Indirect Bilirubin: 0.5 mg/dL (ref 0.3–0.9)
Total Bilirubin: 0.6 mg/dL (ref 0.3–1.2)
Total Protein: 6.4 g/dL — ABNORMAL LOW (ref 6.5–8.1)

## 2019-07-09 LAB — TROPONIN I (HIGH SENSITIVITY)
Troponin I (High Sensitivity): 5 ng/L (ref <18)
Troponin I (High Sensitivity): 7 ng/L (ref <18)

## 2019-07-09 LAB — BASIC METABOLIC PANEL
Anion gap: 12 (ref 5–15)
BUN: 20 mg/dL (ref 8–23)
CO2: 26 mmol/L (ref 22–32)
Calcium: 9.4 mg/dL (ref 8.9–10.3)
Chloride: 101 mmol/L (ref 98–111)
Creatinine, Ser: 0.69 mg/dL (ref 0.44–1.00)
GFR calc Af Amer: >60 mL/min (ref >60)
GFR calc non Af Amer: >60 mL/min (ref >60)
Glucose, Bld: 110 mg/dL — ABNORMAL HIGH (ref 70–99)
Potassium: 2.9 mmol/L — ABNORMAL LOW (ref 3.5–5.1)
Sodium: 139 mmol/L (ref 135–145)

## 2019-07-09 LAB — LIPASE, BLOOD: Lipase: 22 U/L (ref 11–51)

## 2019-07-09 MED ORDER — FENTANYL CITRATE (PF) 100 MCG/2ML IJ SOLN
12.5000 ug | Freq: Once | INTRAMUSCULAR | Status: AC
  Administered 2019-07-09: 04:00:00 12.5 ug via INTRAVENOUS
  Filled 2019-07-09: qty 2

## 2019-07-09 MED ORDER — MORPHINE SULFATE (PF) 4 MG/ML IV SOLN
4.0000 mg | Freq: Once | INTRAVENOUS | Status: DC
  Filled 2019-07-09: qty 1

## 2019-07-09 MED ORDER — POTASSIUM CHLORIDE CRYS ER 20 MEQ PO TBCR
40.0000 meq | EXTENDED_RELEASE_TABLET | Freq: Once | ORAL | Status: AC
  Administered 2019-07-09: 40 meq via ORAL
  Filled 2019-07-09: qty 2

## 2019-07-09 MED ORDER — SODIUM CHLORIDE 0.9% FLUSH: 3.0000 mL | Freq: Once | INTRAVENOUS | Status: DC

## 2019-07-09 MED ORDER — IOHEXOL 350 MG/ML SOLN
100.0000 mL | Freq: Once | INTRAVENOUS | Status: AC | PRN
  Administered 2019-07-09: 100 mL via INTRAVENOUS

## 2019-07-09 NOTE — Discharge Instructions (Addendum)
Recommend increasing your Prilosec dose to twice daily as, if esophageal spasm was the cause of your now resolved pain, acid reflux can be the cause.   Return to the emergency department with any new or concerning symptoms.

## 2019-07-09 NOTE — ED Provider Notes (Signed)
[facility] EMERGENCY DEPARTMENT Provider Note   CSN: [id number] Arrival date & time: 07/09/19  0139     History Chief Complaint  Patient presents with  . Chest Pain    Patricia Solis is a 68 y.o. female.  Patient to ED with sharp, constant, severe pain that woke her from sleep around midnight. The pain started in her right thoracic back and epigastrium/lower central chest, and later progressed to include her left back. No SOB, painful breathing. No change in the character of the pain with movement or breathing. No recent illness, cough, congestion, fever. No nausea or vomiting associated with the symptoms tonight. No history of similar symptoms.   The history is provided by the patient and the spouse. No language interpreter was used.       Past Medical History:  Diagnosis Date  . Basal cell carcinoma 2007   BCC  . Breast cancer ([place])    Invasive lobular carcinoma RIGHT breast  . Carotid bruit 08/2008   Normal Doppler  . DUB (dysfunctional uterine bleeding)   . Esophagitis   . HTN (hypertension)    Tx started 06/2010  . Hyperlipidemia   . Hypothyroidism   . MHA (microangiopathic hemolytic anemia) (HCC)   . Personal history of chemotherapy    2003  . Personal history of radiation therapy    2003 right breast  . Spinal stenosis     Patient Active Problem List   Diagnosis Date Noted  . Esophageal stricture 01/26/2011  . Arrhythmia 12/29/2010  . Essential hypertension 07/21/2010  . CONTACT DERMATITIS&OTHER ECZEMA DUE UNSPEC CAUSE 04/07/2010  . Hyperlipidemia 09/11/2007  . MIGRAINE HEADACHE 09/11/2007  . ESOPHAGITIS, REFLUX 09/11/2007  . Hypothyroidism 01/26/2007    Past Surgical History:  Procedure Laterality Date  . BREAST BIOPSY Right    2007 benign  . BREAST LUMPECTOMY Right    2003  . BTL    . CATARACT EXTRACTION, BILATERAL Bilateral 03/26/2016   still blurred vision; Md states this is fine  . CERVICAL LAMINECTOMY     Left  .  FOOT SURGERY    . MASTECTOMY W/ NODES PARTIAL     Right  . SHOULDER ARTHROSCOPY Right   . TUBAL LIGATION  1981     OB History   No obstetric history on file.     Family History  Problem Relation Age of Onset  . Heart attack Father   . Coronary artery disease Father   . Diabetes Father   . Coronary artery disease Mother   . Leukemia Mother   . Thyroid disease Mother   . Multiple sclerosis Sister        Twin  . Breast cancer Maternal Aunt     Social History   Tobacco Use  . Smoking status: Never Smoker  . Smokeless tobacco: Never Used  Substance Use Topics  . Alcohol use: No  . Drug use: No    Home Medications Prior to Admission medications   Medication Sig Start Date End Date Taking? Authorizing Provider  aspirin 81 MG EC tablet Take 81 mg by mouth daily.      [provider]  calcium carbonate (OS-CAL) 600 MG TABS tablet Take 1 tablet (600 mg total) by mouth 2 (two) times daily with a meal. 04/27/16   Nafziger, Tommi Rumps, NP  GLUCOSAMINE PO Take 1 tablet by mouth 2 (two) times daily.    [provider]  hydrochlorothiazide (HYDRODIURIL) 25 MG tablet Take 1 tablet (25 mg total)  by mouth daily. 10/01/18   Nafziger, Tommi Rumps, NP  levothyroxine (SYNTHROID) 75 MCG tablet TAKE 1 TABLET DAILY 05/11/19   Nafziger, Tommi Rumps, NP  methylPREDNISolone (MEDROL DOSEPAK) 4 MG TBPK tablet Take as directed 05/11/19   Dorothyann Peng, NP  omeprazole (PRILOSEC) 20 MG capsule TAKE 1 BY MOUTH DAILY 05/11/19   Nafziger, Tommi Rumps, NP  simvastatin (ZOCOR) 40 MG tablet Take 1 tablet by mouth every night at bedtime 05/11/19   Nafziger, Tommi Rumps, NP    Allergies    Ambien cr [zolpidem], Cozaar [losartan], Erythromycin, Hydrocodone, Lisinopril, Penicillins, and Clindamycin/lincomycin  Review of Systems   Review of Systems  Constitutional: Negative for chills and fever.  HENT: Negative.   Respiratory: Negative.  Negative for cough.   Cardiovascular: Positive for chest pain.  Gastrointestinal:  Positive for abdominal pain. Negative for nausea and vomiting.  Musculoskeletal: Positive for back pain.  Skin: Negative.   Neurological: Negative.  Negative for syncope, weakness and light-headedness.    Physical Exam Updated Vital Signs BP 126/61 (BP Location: Left Arm)   Pulse (!) 123   Temp 98.3 F (36.8 C) (Oral)   Resp (!) 21   Ht 5' 1"  (1.549 m)   Wt 67.1 kg   SpO2 93%   BMI 27.96 kg/m   Physical Exam Vitals and nursing note reviewed.  Constitutional:      Appearance: She is well-developed.  HENT:     Head: Normocephalic.  Neck:     Comments: No carotid bruit Cardiovascular:     Rate and Rhythm: Regular rhythm. Tachycardia present.     Heart sounds: No murmur.  Pulmonary:     Effort: Pulmonary effort is normal.     Breath sounds: Normal breath sounds. No decreased breath sounds, wheezing, rhonchi or rales.  Abdominal:     General: Bowel sounds are normal.     Palpations: Abdomen is soft.     Tenderness: There is no abdominal tenderness. There is no guarding or rebound.  Musculoskeletal:        General: Normal range of motion.     Cervical back: Normal range of motion and neck supple.     Comments: No tenderness of the thoracic back.   Skin:    General: Skin is warm and dry.     Findings: No rash.  Neurological:     Mental Status: She is alert.     Cranial Nerves: No cranial nerve deficit.     ED Results / Procedures / Treatments   Labs (all labs ordered are listed, but only abnormal results are displayed) Labs Reviewed  BASIC METABOLIC PANEL  CBC  I-STAT CHEM 8, ED  TROPONIN I (HIGH SENSITIVITY)    EKG EKG Interpretation  Date/Time:  Monday July 09 2019 01:41:53 EST Ventricular Rate:  116 PR Interval:    QRS Duration: 83 QT Interval:  324 QTC Calculation: 451 R Axis:   81 Text Interpretation: Sinus tachycardia Borderline right axis deviation Low voltage, precordial leads Borderline repolarization abnormality Otherwise no significant  change Confirmed by Addison Lank [phone]) on 07/09/2019 2:03:17 AM   Radiology No results found.  Procedures Procedures (including critical care time)  Medications Ordered in ED Medications  sodium chloride flush (NS) 0.9 % injection 3 mL (has no administration in time range)  morphine 4 MG/ML injection 4 mg (has no administration in time range)    ED Course  I have reviewed the triage vital signs and the nursing notes.  Pertinent labs & imaging results that were available  during my care of the patient were reviewed by me and considered in my medical decision making (see chart for details).    MDM Rules/Calculators/A&P  Patient to ED with onset of pain around midnight, constant, no modifying factors.  She has a history of HTN, HLD, thyroid disease, remote h/o breast CA. She is tachycardic on arrival. No ischemia on EKG. IV started, labs pending. Pain medication ordered, however, she declines the 4 mg Morphine as she has a history of hallucinations with opioid use. She reports 10/10 pain. Discussed use of a very small dose Fentanyl which the patient agrees with. On re-evaluation, her pain is resolved. Tachycardia improved.   Dissection vs PE considered. Non-ischemic EKG, normal troponin, atypical pain - doubt ACS. No infiltrate or opacity on CXR, no fever, cough - doubt infection. CT angio chest/abd/pel pending to evaluate for dissection, large PE.  Dissection study normal. Pain is still resolved. Esophageal spasm considered as a cause of pain. Lipase and LFT's added to labs with delta troponin draw. If negative, will discharge home to follow up with her primary care.   Delta troponin and lipase are normal. Transaminases are slightly elevated but alk phos and total bili are normal. There is no RUQ tenderness, N, V. Doubt gall bladder etiology. She is on Prilosec for acid reflux. Will increase to twice daily dosing as, if esophageal spasm, it is potentially secondary to reflux.  Recommend PCP follow up.   Final Clinical Impression(s) / ED Diagnoses Final diagnoses:  None   1. Back pain 2. Nonspecific Chest pain  Rx / DC Orders ED Discharge Orders    None       Charlann Lange, PA-C 07/09/19 0701    Fatima Blank, MD 07/09/19 [phone]

## 2019-07-09 NOTE — Telephone Encounter (Signed)
Spoke to pt and advised that Patricia Solis was out of the office until tomorrow. Pt stated she was aware that he does not come in on Mondays. Pt stated she was sending the message for [name] or CMA to receive when they return to the office.

## 2019-07-09 NOTE — ED Provider Notes (Signed)
Attestation: Medical screening examination/treatment/procedure(s) were conducted as a shared visit with non-physician practitioner(s) and myself.  I personally evaluated the patient during the encounter.  Briefly, the patient is a 68 y.o. female with h/o HTN, GERD and breast cancer, here for sudden onset sharp chest pain radiating to back.   Vitals:   07/09/19 0200 07/09/19 0230  BP: 128/73 (!) 141/79  Pulse: (!) 110 (!) 103  Resp: 20 (!) 27  Temp:    SpO2: 95% 92%    CONSTITUTIONAL:  well-appearing, NAD NEURO:  Alert and oriented x 3, no focal deficits EYES:  pupils equal and reactive ENT/NECK:  trachea midline, no JVD CARDIO:  tachy rate, reg rhythm, well-perfused PULM:  None labored breathing GI/GU:  Abdomin non-distended MSK/SPINE:  No gross deformities, no edema SKIN:  no rash, atraumatic PSYCH:  Appropriate speech and behavior    EKG Interpretation  Date/Time:  Monday July 09 2019 01:41:53 EST Ventricular Rate:  116 PR Interval:    QRS Duration: 83 QT Interval:  324 QTC Calculation: 451 R Axis:   81 Text Interpretation: Sinus tachycardia Borderline right axis deviation Low voltage, precordial leads Borderline repolarization abnormality Otherwise no significant change Confirmed by Addison Lank [phone]) on 07/09/2019 2:03:17 AM       Work up ruled out dissection, PE. ACS. Likely GI related. Noted to have hypokalemia. Oral repletion.  The patient is safe for discharge with strict return precautions.           Fatima Blank, MD 07/09/19 [phone]

## 2019-07-09 NOTE — ED Triage Notes (Signed)
The pt arrived by gems from home   She was awakened from sleep with shortness of breath and chest pain  On arrival alert and oriented and her pain has moved from her lt chest to her rt chest  Still has her gallbladder still iv per ems she was given 324 aspirin  Sl nitro on the way here and that did not relieve the pain

## 2019-07-10 ENCOUNTER — Encounter: Payer: Self-pay | Admitting: Adult Health

## 2019-07-10 ENCOUNTER — Telehealth (INDEPENDENT_AMBULATORY_CARE_PROVIDER_SITE_OTHER): Payer: PPO | Admitting: Adult Health

## 2019-07-10 ENCOUNTER — Other Ambulatory Visit: Payer: Self-pay

## 2019-07-10 DIAGNOSIS — K2100 Gastro-esophageal reflux disease with esophagitis, without bleeding: Secondary | ICD-10-CM | POA: Diagnosis not present

## 2019-07-10 DIAGNOSIS — E876 Hypokalemia: Secondary | ICD-10-CM

## 2019-07-10 NOTE — Progress Notes (Signed)
Virtual Visit via Video Note  I connected with Patricia Solis on 07/10/19 at  3:00 PM EST by a video enabled telemedicine application and verified that I am speaking with the correct person using two identifiers.  Location patient: home Location provider:work or home office Persons participating in the virtual visit: patient, provider  I discussed the limitations of evaluation and management by telemedicine and the availability of in person appointments. The patient expressed understanding and agreed to proceed.   HPI: This patient was seen in the emergency room yesterday evening with a complaint of sharp, constant, and severe pain that woke her from sleep around midnight.  The pain started in her right thoracic back and epigastrium/lower central chest, and later progressed to include her left back.  She denies shortness of breath or painful breathing.  There was no change in the character of the pain with movement or breathing.  She was tachycardic to 110 upon arrival to the emergency room.  Her EKG showed no ischemia, she was given a small dose of fentanyl and on reevaluation her pain had resolved and tachycardia had improved.  The normal troponin.  No infiltrate or opacity on chest x-ray, no fever, cough.  Her CT angio chest/abdomen/pelvis was normal.  There was no right upper quadrant tenderness.  Doubtful for gallbladder etiology.  He was advised to increase Prilosec to twice daily dosing as it was thought that GERD/esophageal spasm was cause.   When reviewing labs with the patient today it was noticed that her potassium was low.  She endorses receiving a potassium supplement in the emergency room.  She is on HCTZ but has not had hypokalemia in the past.  Today she reports that she is feeling "much better than yesterday".  She denies nausea, vomiting, chest pain, shortness of breath or difficulty swallowing.   ROS: See pertinent positives and negatives per HPI.  Past Medical History:   Diagnosis Date  . Basal cell carcinoma 2007   BCC  . Breast cancer ([place])    Invasive lobular carcinoma RIGHT breast  . Carotid bruit 08/2008   Normal Doppler  . DUB (dysfunctional uterine bleeding)   . Esophagitis   . HTN (hypertension)    Tx started 06/2010  . Hyperlipidemia   . Hypothyroidism   . MHA (microangiopathic hemolytic anemia) (HCC)   . Personal history of chemotherapy    2003  . Personal history of radiation therapy    2003 right breast  . Spinal stenosis     Past Surgical History:  Procedure Laterality Date  . BREAST BIOPSY Right    2007 benign  . BREAST LUMPECTOMY Right    2003  . BTL    . CATARACT EXTRACTION, BILATERAL Bilateral 03/26/2016   still blurred vision; Md states this is fine  . CERVICAL LAMINECTOMY     Left  . FOOT SURGERY    . MASTECTOMY W/ NODES PARTIAL     Right  . SHOULDER ARTHROSCOPY Right   . TUBAL LIGATION  1981    Family History  Problem Relation Age of Onset  . Heart attack Father   . Coronary artery disease Father   . Diabetes Father   . Coronary artery disease Mother   . Leukemia Mother   . Thyroid disease Mother   . Multiple sclerosis Sister        Twin  . Breast cancer Maternal Aunt       Current Outpatient Medications:  .  aspirin 81 MG EC tablet, Take 81  mg by mouth daily.  , Disp: , Rfl:  .  B Complex-C (B-COMPLEX WITH VITAMIN C) tablet, Take 1 tablet by mouth daily., Disp: , Rfl:  .  calcium carbonate (OS-CAL) 600 MG TABS tablet, Take 1 tablet (600 mg total) by mouth 2 (two) times daily with a meal., Disp: 180 tablet, Rfl: 3 .  GLUCOSAMINE PO, Take 1 tablet by mouth 2 (two) times daily., Disp: , Rfl:  .  hydrochlorothiazide (HYDRODIURIL) 25 MG tablet, Take 1 tablet (25 mg total) by mouth daily., Disp: 90 tablet, Rfl: 3 .  levothyroxine (SYNTHROID) 75 MCG tablet, TAKE 1 TABLET DAILY (Patient taking differently: Take 75 mcg by mouth daily before breakfast. ), Disp: 90 tablet, Rfl: 3 .  methylPREDNISolone (MEDROL  DOSEPAK) 4 MG TBPK tablet, Take as directed (Patient not taking: Reported on 07/09/2019), Disp: 21 tablet, Rfl: 0 .  omeprazole (PRILOSEC) 20 MG capsule, TAKE 1 BY MOUTH DAILY (Patient taking differently: Take 20 mg by mouth daily. ), Disp: 90 capsule, Rfl: 3 .  simvastatin (ZOCOR) 40 MG tablet, Take 1 tablet by mouth every night at bedtime, Disp: 90 tablet, Rfl: 3  EXAM:  VITALS per patient if applicable:  GENERAL: alert, oriented, appears well and in no acute distress  HEENT: atraumatic, conjunttiva clear, no obvious abnormalities on inspection of external nose and ears  NECK: normal movements of the head and neck  LUNGS: on inspection no signs of respiratory distress, breathing rate appears normal, no obvious gross SOB, gasping or wheezing  CV: no obvious cyanosis  MS: moves all visible extremities without noticeable abnormality  PSYCH/NEURO: pleasant and cooperative, no obvious depression or anxiety, speech and thought processing grossly intact  ASSESSMENT AND PLAN:  Discussed the following assessment and plan:  1. Gastroesophageal reflux disease with esophagitis without hemorrhage -She reports that her diet has been poor.  We will have her continue with Prilosec for the next 7 to 10 days as twice daily dosing then return to daily dosing.  She was advised to clean up her diet.  If symptoms return then can consider Protonix or send to gastroenterology.  2. Hypokalemia -We will have her come back in 2 weeks for repeat lab work. - Basic Metabolic Panel; Future     I discussed the assessment and treatment plan with the patient. The patient was provided an opportunity to ask questions and all were answered. The patient agreed with the plan and demonstrated an understanding of the instructions.   The patient was advised to call back or seek an in-person evaluation if the symptoms worsen or if the condition fails to improve as anticipated.   Dorothyann Peng, NP

## 2019-07-23 ENCOUNTER — Other Ambulatory Visit: Payer: Self-pay

## 2019-07-24 ENCOUNTER — Other Ambulatory Visit (INDEPENDENT_AMBULATORY_CARE_PROVIDER_SITE_OTHER): Payer: PPO

## 2019-07-24 ENCOUNTER — Other Ambulatory Visit: Payer: PPO

## 2019-07-24 DIAGNOSIS — E876 Hypokalemia: Secondary | ICD-10-CM

## 2019-07-24 LAB — BASIC METABOLIC PANEL
BUN: 27 mg/dL — ABNORMAL HIGH (ref 6–23)
CO2: 29 mEq/L (ref 19–32)
Calcium: 9.5 mg/dL (ref 8.4–10.5)
Chloride: 103 mEq/L (ref 96–112)
Creatinine, Ser: 0.66 mg/dL (ref 0.40–1.20)
GFR: 88.95 mL/min (ref >60.00)
Glucose, Bld: 92 mg/dL (ref 70–99)
Potassium: 3.6 mEq/L (ref 3.5–5.1)
Sodium: 141 mEq/L (ref 135–145)

## 2019-08-03 ENCOUNTER — Encounter: Payer: Self-pay | Admitting: Adult Health

## 2019-08-11 ENCOUNTER — Encounter: Payer: Self-pay | Admitting: Adult Health

## 2019-08-11 DIAGNOSIS — Z760 Encounter for issue of repeat prescription: Secondary | ICD-10-CM

## 2019-08-14 ENCOUNTER — Encounter: Payer: Self-pay | Admitting: Adult Health

## 2019-08-14 MED ORDER — SIMVASTATIN 40 MG PO TABS: ORAL_TABLET | ORAL | 3 refills | Status: DC

## 2019-08-21 ENCOUNTER — Other Ambulatory Visit: Payer: Self-pay

## 2019-08-21 ENCOUNTER — Telehealth (INDEPENDENT_AMBULATORY_CARE_PROVIDER_SITE_OTHER): Payer: PPO | Admitting: Adult Health

## 2019-08-21 DIAGNOSIS — I10 Essential (primary) hypertension: Secondary | ICD-10-CM | POA: Diagnosis not present

## 2019-08-21 MED ORDER — HYDROCHLOROTHIAZIDE 25 MG PO TABS: 25.0000 mg | ORAL_TABLET | Freq: Every day | ORAL | 3 refills | Status: DC

## 2019-08-21 NOTE — Progress Notes (Signed)
Virtual Visit via Telephone Note  I connected with Patricia Solis on 08/21/19 at  5:00 PM EST by telephone and verified that I am speaking with the correct person using two identifiers.   I discussed the limitations, risks, security and privacy concerns of performing an evaluation and management service by telephone and the availability of in person appointments. I also discussed with the patient that there may be a patient responsible charge related to this service. The patient expressed understanding and agreed to proceed.  Location patient: home Location provider: work or home office Participants present for the call: patient, provider Patient did not have a visit in the prior 7 days to address this/these issue(s).   History of Present Illness: 69 year old female who is being seen today for medication management.  She currently uses [facility] her mail order pharmacy and has had many issues with them saying that they never received prescriptions from [place].  She would like to start transferring her prescriptions over to [place].  Currently she needs her hydrochlorothiazide switched over.  Husband who is also my patient will need his prescriptions sent over to   Observations/Objective: Patient sounds cheerful and well on the phone. I do not appreciate any SOB. Speech and thought processing are grossly intact. Patient reported vitals:  Assessment and Plan: 1. Essential hypertension - Will send over new prescription for HCTZ    Follow Up Instructions:   I did not refer this patient for an OV in the next 24 hours for this/these issue(s).  I discussed the assessment and treatment plan with the patient. The patient was provided an opportunity to ask questions and all were answered. The patient agreed with the plan and demonstrated an understanding of the instructions.   The patient was advised to call back or seek an in-person evaluation if the symptoms worsen or if the  condition fails to improve as anticipated.  I provided 11  minutes of non-face-to-face time during this encounter.   Dorothyann Peng, NP

## 2019-09-01 ENCOUNTER — Ambulatory Visit: Payer: PPO | Attending: Internal Medicine

## 2019-09-01 DIAGNOSIS — Z23 Encounter for immunization: Secondary | ICD-10-CM | POA: Insufficient documentation

## 2019-09-01 NOTE — Progress Notes (Signed)
   Covid-19 Vaccination Clinic  Name:  Lakya Simental    MRN: [id number] DOB: 10/11/1950  09/01/2019  Ms. Vacha was observed post Covid-19 immunization for 15 minutes without incidence. She was provided with Vaccine Information Sheet and instruction to access the V-Safe system.   Ms. Royalty was instructed to call 911 with any severe reactions post vaccine: Marland Kitchen Difficulty breathing  . Swelling of your face and throat  . A fast heartbeat  . A bad rash all over your body  . Dizziness and weakness    Immunizations Administered    Name Date Dose VIS Date Route   Pfizer COVID-19 Vaccine 09/01/2019  5:31 PM 0.3 mL 07/06/2019 Intramuscular   Manufacturer: [place]   Lot: [id number]   [place]: [id number]

## 2019-09-03 ENCOUNTER — Encounter: Payer: Self-pay | Admitting: Adult Health

## 2019-09-04 NOTE — Telephone Encounter (Signed)
Please advise Picture is attached

## 2019-09-06 ENCOUNTER — Ambulatory Visit: Payer: PPO

## 2019-09-06 DIAGNOSIS — Z961 Presence of intraocular lens: Secondary | ICD-10-CM | POA: Diagnosis not present

## 2019-09-06 DIAGNOSIS — H5213 Myopia, bilateral: Secondary | ICD-10-CM | POA: Diagnosis not present

## 2019-09-06 DIAGNOSIS — H524 Presbyopia: Secondary | ICD-10-CM | POA: Diagnosis not present

## 2019-09-06 DIAGNOSIS — H52203 Unspecified astigmatism, bilateral: Secondary | ICD-10-CM | POA: Diagnosis not present

## 2019-09-26 ENCOUNTER — Ambulatory Visit: Payer: PPO | Attending: Internal Medicine

## 2019-09-26 DIAGNOSIS — Z23 Encounter for immunization: Secondary | ICD-10-CM | POA: Insufficient documentation

## 2019-09-26 NOTE — Progress Notes (Signed)
   Covid-19 Vaccination Clinic  Name:  Patricia Solis    MRN: [id number] DOB: April 30, 1951  09/26/2019  [name] was observed post Covid-19 immunization for 30 minutes based on pre-vaccination screening without incident. She was provided with Vaccine Information Sheet and instruction to access the V-Safe system.   [name] was instructed to call 911 with any severe reactions post vaccine: Marland Kitchen Difficulty breathing  . Swelling of face and throat  . A fast heartbeat  . A bad rash all over body  . Dizziness and weakness   Immunizations Administered    Name Date Dose VIS Date Route   Pfizer COVID-19 Vaccine 09/26/2019  1:56 PM 0.3 mL 07/06/2019 Intramuscular   Manufacturer: [place]   Lot: [id number]   [place]: [id number]

## 2019-09-26 NOTE — Progress Notes (Signed)
   Covid-19 Vaccination Clinic  Name:  Patricia Solis    MRN: [id number] DOB: [birth year]  09/26/2019  [name] was observed post Covid-19 immunization for 15 minutes without incident. She was provided with Vaccine Information Sheet and instruction to access the V-Safe system.   [name] was instructed to call 911 with any severe reactions post vaccine: Marland Kitchen Difficulty breathing  . Swelling of face and throat  . A fast heartbeat  . A bad rash all over body  . Dizziness and weakness   Immunizations Administered    Name Date Dose VIS Date Route   Pfizer COVID-19 Vaccine 09/26/2019  1:56 PM 0.3 mL 07/06/2019 Intramuscular   Manufacturer: [place]   Lot: [id number]   [place]: [id number]

## 2019-10-03 ENCOUNTER — Other Ambulatory Visit: Payer: Self-pay | Admitting: Adult Health

## 2019-10-03 ENCOUNTER — Telehealth: Payer: Self-pay

## 2019-10-03 DIAGNOSIS — E039 Hypothyroidism, unspecified: Secondary | ICD-10-CM

## 2019-10-03 NOTE — Telephone Encounter (Signed)
I would like to retest her since we changed doses. TSH has been entered

## 2019-10-03 NOTE — Telephone Encounter (Signed)
Noted!   Pt notified of update and has been scheduled. Pt stated that she does not want her medications to come from [facility]. Pt stated all medications refills should go [facility].

## 2019-10-03 NOTE — Telephone Encounter (Signed)
[facility] is sending a Rx request for levothyroxine. Pt last TSH 4 months ago was abnormal and over treated.

## 2019-10-04 ENCOUNTER — Other Ambulatory Visit: Payer: Self-pay

## 2019-10-04 ENCOUNTER — Other Ambulatory Visit (INDEPENDENT_AMBULATORY_CARE_PROVIDER_SITE_OTHER): Payer: PPO

## 2019-10-04 DIAGNOSIS — E039 Hypothyroidism, unspecified: Secondary | ICD-10-CM

## 2019-10-04 LAB — TSH: TSH: 1.23 u[IU]/mL (ref 0.35–4.50)

## 2019-10-16 ENCOUNTER — Other Ambulatory Visit: Payer: Self-pay | Admitting: Adult Health

## 2019-10-16 ENCOUNTER — Encounter: Payer: Self-pay | Admitting: Adult Health

## 2019-10-16 MED ORDER — METHYLPREDNISOLONE 4 MG PO TBPK: ORAL_TABLET | ORAL | 0 refills | Status: DC

## 2019-10-22 ENCOUNTER — Other Ambulatory Visit: Payer: Self-pay

## 2019-10-23 ENCOUNTER — Encounter: Payer: Self-pay | Admitting: Adult Health

## 2019-10-23 ENCOUNTER — Ambulatory Visit (INDEPENDENT_AMBULATORY_CARE_PROVIDER_SITE_OTHER): Payer: PPO | Admitting: Adult Health

## 2019-10-23 ENCOUNTER — Ambulatory Visit (INDEPENDENT_AMBULATORY_CARE_PROVIDER_SITE_OTHER): Payer: PPO

## 2019-10-23 VITALS — BP 130/72 | Temp 98.1°F | Wt 151.0 lb

## 2019-10-23 DIAGNOSIS — R000 Tachycardia, unspecified: Secondary | ICD-10-CM

## 2019-10-23 DIAGNOSIS — M79605 Pain in left leg: Secondary | ICD-10-CM

## 2019-10-23 NOTE — Progress Notes (Signed)
Subjective:    Patient ID: Patricia Solis, female    DOB: 04/18/1951, 69 y.o.   MRN: [id number]  HPI  69 year old female who  has a past medical history of Basal cell carcinoma (2007), Breast cancer ([place]), Carotid bruit (08/2008), DUB (dysfunctional uterine bleeding), Esophagitis, HTN (hypertension), Hyperlipidemia, Hypothyroidism, MHA (microangiopathic hemolytic anemia) ([place]), Personal history of chemotherapy, Personal history of radiation therapy, and Spinal stenosis.  She presents to the clinic today for an acute issue of right lower leg pain.  This pain has been present for multiple months, at least since October 2020.  She first complained of this issue she has read prescribed a Medrol Dosepak for suspected muscular versus ligament strain.  She did report improvement for a short period of time with the Medrol Dosepak.  Most recently, last week we prescribed another Medrol Dosepak but she did not get any relief from this.  Pain is worse when resting for long periods of time as well as with ambulation.  Pain is located on the lateral aspect of the left leg.  She denies any trauma or aggravating factors.  Additionally, she has been monitoring her pulse and blood pressure at home.  Her blood pressures have been consistently in the 130s over 70s to 80s.  She does have periods of time where she notices that her pulse is elevated into the low 100s.  Usually she has no signs or symptoms but over the last week she has had 2 episodes where her heart rate was elevated and she was symptomatic.  The first time her pulse her pulse was 108 and she suddenly felt "anxious".  This anxious feeling did not last long and her heart rate receded into the 80s.  Most recently, earlier today she was in the bathroom getting ready in the morning and she became lightheaded and dizzy, she checked her blood pressure and pulse and her blood pressure was within normal limits but her pulse was 121.  Today in the office with a  pulse oximeter her pulse was bouncing between the high 90s and low 100s  Review of Systems See HPI   Past Medical History:  Diagnosis Date  . Basal cell carcinoma 2007   BCC  . Breast cancer ([place])    Invasive lobular carcinoma RIGHT breast  . Carotid bruit 08/2008   Normal Doppler  . DUB (dysfunctional uterine bleeding)   . Esophagitis   . HTN (hypertension)    Tx started 06/2010  . Hyperlipidemia   . Hypothyroidism   . MHA (microangiopathic hemolytic anemia) ([facility])   . Personal history of chemotherapy    2003  . Personal history of radiation therapy    2003 right breast  . Spinal stenosis     Social History   Socioeconomic History  . Marital status: Married    Spouse name: Not on file  . Number of children: Not on file  . Years of education: Not on file  . Highest education level: Not on file  Occupational History  . Not on file  Tobacco Use  . Smoking status: Never Smoker  . Smokeless tobacco: Never Used  Substance and Sexual Activity  . Alcohol use: No  . Drug use: No  . Sexual activity: Not on file  Other Topics Concern  . Not on file  Social History Narrative   Retired in March    Married for 40 years    One daughter, lives in [place]    Has four grandchildren  Likes to play with grand kids, go to [employer] and mountains.       Social Determinants of Health   Financial Resource Strain:   . Difficulty of Paying Living Expenses:   Food Insecurity:   . Worried About Charity fundraiser in the Last Year:   . Arboriculturist in the Last Year:   Transportation Needs:   . Film/video editor (Medical):   Marland Kitchen Lack of Transportation (Non-Medical):   Physical Activity:   . Days of Exercise per Week:   . Minutes of Exercise per Session:   Stress:   . Feeling of Stress :   Social Connections:   . Frequency of Communication with Friends and Family:   . Frequency of Social Gatherings with Friends and Family:   . Attends Religious Services:   . Active  Member of Clubs or Organizations:   . Attends Archivist Meetings:   Marland Kitchen Marital Status:   Intimate Partner Violence:   . Fear of Current or Ex-Partner:   . Emotionally Abused:   Marland Kitchen Physically Abused:   . Sexually Abused:     Past Surgical History:  Procedure Laterality Date  . BREAST BIOPSY Right    2007 benign  . BREAST LUMPECTOMY Right    2003  . BTL    . CATARACT EXTRACTION, BILATERAL Bilateral 03/26/2016   still blurred vision; Md states this is fine  . CERVICAL LAMINECTOMY     Left  . FOOT SURGERY    . MASTECTOMY W/ NODES PARTIAL     Right  . SHOULDER ARTHROSCOPY Right   . TUBAL LIGATION  1981    Family History  Problem Relation Age of Onset  . Heart attack Father   . Coronary artery disease Father   . Diabetes Father   . Coronary artery disease Mother   . Leukemia Mother   . Thyroid disease Mother   . Multiple sclerosis Sister        Twin  . Breast cancer Maternal Aunt     Allergies  Allergen Reactions  . Ambien Cr [Zolpidem] Other (See Comments)    Hallucinations  . Cozaar [Losartan] Cough  . Erythromycin     REACTION: rash  . Hydrocodone Other (See Comments)    Hallucinations  . Lisinopril Cough  . Penicillins     REACTION: rash  . Clindamycin/Lincomycin Rash    Current Outpatient Medications on File Prior to Visit  Medication Sig Dispense Refill  . aspirin 81 MG EC tablet Take 81 mg by mouth daily.      . B Complex-C (B-COMPLEX WITH VITAMIN C) tablet Take 1 tablet by mouth daily.    . calcium carbonate (OS-CAL) 600 MG TABS tablet Take 1 tablet (600 mg total) by mouth 2 (two) times daily with a meal. 180 tablet 3  . GLUCOSAMINE PO Take 1 tablet by mouth 2 (two) times daily.    . hydrochlorothiazide (HYDRODIURIL) 25 MG tablet Take 1 tablet (25 mg total) by mouth daily. 90 tablet 3  . levothyroxine (SYNTHROID) 75 MCG tablet TAKE 1 TABLET DAILY (Patient taking differently: Take 75 mcg by mouth daily before breakfast. ) 90 tablet 3  .  simvastatin (ZOCOR) 40 MG tablet Take 1 tablet by mouth every night at bedtime 90 tablet 3   No current facility-administered medications on file prior to visit.    BP 130/72   Temp 98.1 F (36.7 C)   Wt 151 lb (68.5 kg)   BMI  28.53 kg/m       Objective:   Physical Exam Vitals and nursing note reviewed.  Constitutional:      Appearance: Normal appearance.  Cardiovascular:     Rate and Rhythm: Normal rate and regular rhythm.     Pulses: Normal pulses.     Heart sounds: Normal heart sounds. No murmur. No friction rub. No gallop.   Pulmonary:     Effort: Pulmonary effort is normal.     Breath sounds: Normal breath sounds.  Abdominal:     General: Abdomen is flat.     Palpations: Abdomen is soft.  Musculoskeletal:        General: Tenderness (with palpation along lateral aspect of left leg) present. No deformity. Normal range of motion.     Right lower leg: No edema.     Left lower leg: No edema.  Skin:    General: Skin is warm and dry.     Findings: No erythema.     Comments: No bruising noted   Neurological:     General: No focal deficit present.     Mental Status: She is alert and oriented to person, place, and time.  Psychiatric:        Mood and Affect: Mood normal.        Behavior: Behavior normal.        Thought Content: Thought content normal.        Judgment: Judgment normal.       Assessment & Plan:  1. Left leg pain -Appears to be soft tissue but not necessarily skeletal.  Will get x-ray to look for any calcifications or stress fractures.  Consider referral to sports medicine or physical therapy in the future - DG Tibia/Fibula Left; Future - DG Tibia/Fibula Left  2. Tachycardia, unspecified - will order 72 hour holter monitor. I do not see a reason to start her on a BB at this time. Consider referral to cardiology if needed - EKG 12-Lead- Sinus  Rhythm  Low voltage -possible pulmonary disease. , rate 83 - Holter monitor - 72 hour; Future   Dorothyann Peng, NP

## 2019-10-24 ENCOUNTER — Encounter: Payer: Self-pay | Admitting: *Deleted

## 2019-10-24 ENCOUNTER — Telehealth: Payer: Self-pay | Admitting: Adult Health

## 2019-10-24 DIAGNOSIS — M79605 Pain in left leg: Secondary | ICD-10-CM

## 2019-10-24 NOTE — Telephone Encounter (Signed)
Updated patient on her x-ray which showed no acute fracture or misalignment.  Symptoms appear to be more soft tissue related.  Will refer to physical therapy for further treatment

## 2019-10-24 NOTE — Progress Notes (Signed)
Patient ID: Patricia Solis, female   DOB: 06/19/1951, 69 y.o.   MRN: [id number] Patient has been enrolled for [employer] to mail a 3 day ZIO XT long term holter monitor to her home.  Instructions sent to patient via My Chart message, and will be included in her monitor kit.

## 2019-10-27 ENCOUNTER — Other Ambulatory Visit (INDEPENDENT_AMBULATORY_CARE_PROVIDER_SITE_OTHER): Payer: PPO

## 2019-10-27 DIAGNOSIS — R000 Tachycardia, unspecified: Secondary | ICD-10-CM

## 2019-11-08 DIAGNOSIS — R000 Tachycardia, unspecified: Secondary | ICD-10-CM | POA: Diagnosis not present

## 2019-11-09 ENCOUNTER — Encounter: Payer: Self-pay | Admitting: Adult Health

## 2019-11-13 ENCOUNTER — Other Ambulatory Visit: Payer: Self-pay

## 2019-11-13 ENCOUNTER — Ambulatory Visit: Payer: PPO | Attending: Adult Health | Admitting: Physical Therapy

## 2019-11-13 DIAGNOSIS — M545 Low back pain, unspecified: Secondary | ICD-10-CM

## 2019-11-13 DIAGNOSIS — M79662 Pain in left lower leg: Secondary | ICD-10-CM | POA: Diagnosis not present

## 2019-11-13 DIAGNOSIS — M6281 Muscle weakness (generalized): Secondary | ICD-10-CM | POA: Insufficient documentation

## 2019-11-13 NOTE — Patient Instructions (Signed)
Access Code: [email or web address] URL: [email or web address] Date: 11/13/2019 Prepared by: Madelyn Flavors  Exercises Standing Hip Abduction with Counter Support - 1 x daily - 7 x weekly - 10 reps - 3 sets Standing March with Counter Support - 1 x daily - 7 x weekly - 10 reps - 3 sets Standing Hip Extension with Counter Support - 1 x daily - 7 x weekly - 10 reps - 3 sets Heel rises with counter support - 1 x daily - 7 x weekly - 3 sets - 10 reps  Patient Education Trigger Point Dry Needling

## 2019-11-13 NOTE — Therapy (Signed)
[facility] [address] [place], [place], [address] Phone: [phone]   Fax:  [phone]  Physical Therapy Evaluation  Patient Details  Name: Patricia Solis MRN: [id number] Date of Birth: Feb 15, 1951 Referring Provider (PT): Dorothyann Peng NP   Encounter Date: 11/13/2019  PT End of Session - 11/13/19 1229    Visit Number  1    Date for PT Re-Evaluation  01/08/20    PT Start Time  1230    PT Stop Time  [id number]    PT Time Calculation (min)  47 min    Activity Tolerance  Patient tolerated treatment well    Behavior During Therapy  [facility] for tasks assessed/performed       Past Medical History:  Diagnosis Date  . Basal cell carcinoma 2007   BCC  . Breast cancer ([place])    Invasive lobular carcinoma RIGHT breast  . Carotid bruit 08/2008   Normal Doppler  . DUB (dysfunctional uterine bleeding)   . Esophagitis   . HTN (hypertension)    Tx started 06/2010  . Hyperlipidemia   . Hypothyroidism   . MHA (microangiopathic hemolytic anemia) ([facility])   . Personal history of chemotherapy    2003  . Personal history of radiation therapy    2003 right breast  . Spinal stenosis     Past Surgical History:  Procedure Laterality Date  . BREAST BIOPSY Right    2007 benign  . BREAST LUMPECTOMY Right    2003  . BTL    . CATARACT EXTRACTION, BILATERAL Bilateral 03/26/2016   still blurred vision; Md states this is fine  . CERVICAL LAMINECTOMY     Left  . FOOT SURGERY    . MASTECTOMY W/ NODES PARTIAL     Right  . SHOULDER ARTHROSCOPY Right   . TUBAL LIGATION  1981    There were no vitals filed for this visit.   Subjective Assessment - 11/13/19 1234    Subjective  Patient getting sharp pains in lateral lower leg that refers into knee and foot. Pain started last year prior to October. Pain occurs intermittently and happens in all positions. Patient had low back pain in the past but greater than 10 years ago to where she  could hardly move but it resolved.    Pertinent History  Breast CA 2003, HTN, OA, HA, low back pain    Patient Stated Goals  get rid of pain    Currently in Pain?  Yes    Pain Score  8     Pain Location  Leg    Pain Orientation  Left    Pain Descriptors / Indicators  Sharp    Pain Type  Chronic pain    Pain Radiating Towards  into knee and foot    Pain Onset  More than a month ago    Pain Frequency  Intermittent    Pain Relieving Factors  nothing         [facility] PT Assessment - 11/13/19 0001      Assessment   Medical Diagnosis  left leg pain    Referring Provider (PT)  Dorothyann Peng NP    Onset Date/Surgical Date  04/26/19    Next MD Visit  none      Precautions   Precautions  None    Precaution Comments  h/o CA      Balance Screen   Has the patient fallen in the past 6 months  No  Has the patient had a decrease in activity level because of a fear of falling?   No    Is the patient reluctant to leave their home because of a fear of falling?   No      Home Film/video editor residence    Living Arrangements  Spouse/significant other      Prior Function   Level of [place]  Retired    Leisure  walking and working out at [employer]      Observation/Other [employer] on Therapeutic Outcomes (FOTO)   23% limited (24% predicted)      Functional Tests   Functional tests  Single leg stance;Sit to Stand      Single Leg Stance   Comments  RLE 10 sec; LLE 2 sec      Sit to Stand   Comments  13.48 sec      Posture/Postural Control   Posture Comments  WFL      ROM / Strength   AROM / PROM / Strength  AROM;Strength      AROM   Overall AROM Comments  lumbar and BLE WNL      Strength   Overall Strength Comments  left knee ext 4/5, bil knee flex 3+/5; Lt ankle EV and Inv 4-/5     Strength Assessment Site  Hip    Right/Left Hip  Right;Left    Right Hip Flexion  4/5    Right Hip Extension  3+/5    Right Hip  ABduction  4-/5    Left Hip Flexion  4-/5    Left Hip Extension  3+/5    Left Hip ABduction  3+/5      Flexibility   Soft Tissue Assessment /Muscle Length  yes    Hamstrings  left HS    Quadriceps  mild bil quads    ITB  +ober left, Right mild tightness    Piriformis  mild tightness    Quadratus Lumborum  left tender      Palpation   Spinal mobility  decreased PA mobility on right; pain at left L1 with PA    Palpation comment  tender in left QL, gluteals and piriformis; left ant tibialis and peroneals      Special Tests   Other special tests  neg lumbar special tests                Objective measurements completed on examination: See above findings.      [facility] Adult PT Treatment/Exercise - 11/13/19 0001      Exercises   Exercises  Knee/Hip      Knee/Hip Exercises: Standing   Heel Raises  10 reps    Knee Flexion  10 reps;Both    Hip Flexion  Both;10 reps;Knee bent    Hip Abduction  Both;1 set;10 reps;Knee straight    Hip Extension  Both;10 reps             PT Education - 11/13/19 1405    Education Details  hep    Person(s) Educated  Patient    Methods  Explanation;Demonstration;Handout    Comprehension  Verbalized understanding;Returned demonstration       PT Short Term Goals - 11/13/19 1628      PT SHORT TERM GOAL #1   Title  Ind with initial HEP    Time  3    Period  Weeks    Status  New  Target Date  12/04/19      PT SHORT TERM GOAL #2   Title  Patient to report decreased pain in left lower leg by 25% with ADLS.    Time  3    Period  Weeks    Status  New    Target Date  12/04/19      PT SHORT TERM GOAL #3   Title  Patient to understand safe exercises to perform in gym at [facility].    Time  3    Period  Weeks    Status  New    Target Date  12/04/19        PT Long Term Goals - 11/13/19 1630      PT LONG TERM GOAL #1   Title  Ind with advanced HEP for strength and flexibility.    Time  8    Period  Weeks    Status  New     Target Date  01/08/20      PT LONG TERM GOAL #2   Title  Patient to report improvement in left leg pain by 90% with ADLs.    Time  8    Period  Weeks    Status  New      PT LONG TERM GOAL #3   Title  Improved LE strength to 4+/5 or better bil and good stabilization of core with MMT to improve function.    Time  8    Period  Weeks    Status  New      PT LONG TERM GOAL #4   Title  Improved SLS on LLE to 10 sec or more to decrease fall risk.    Time  8    Period  Weeks    Status  New      PT LONG TERM GOAL #5   Title  Improved 5x sit to stand to <= 8 seconds to decrease fall risk.    Time  8    Period  Weeks    Status  New             Plan - 11/13/19 1334    Clinical Impression Statement  Patient presents with complaints of left lower leg pain for over one year. Pain is intermittent and inconsistent and nothing helps when it is hurting. Patient has a history of low back pain many years ago. At that time she would get pain in her low back to where she couldn't move, but it resolved. She has full lumbar and BLE ROM, but has pain in the left L1 region and tightness with right PA mobs. She has significant BLE weakness, left > right, affecting SLS which is limited to 2 sec or less on left. She also has weak core muscles with MMT of hips. Pain limits all ADLS when it hits including walking, sleeping and sitting. Patient works out 3x/wk at [employer] and would like advice on her exercise program as well. She will benefit from PT to address her pain and strength deficits to improve her function.    Personal Factors and Comorbidities  Comorbidity 3+    Comorbidities  Breast CA 2003, HTN, OA, HA, low back pain    Stability/Clinical Decision Making  Evolving/Moderate complexity    Clinical Decision Making  Moderate    Rehab Potential  Excellent    PT Frequency  2x / week    PT Duration  8 weeks    PT Treatment/Interventions  ADLs/Self Care Home  Management;Cryotherapy;Electrical  Stimulation;Moist Heat;Iontophoresis 4mg /ml Dexamethasone;Traction;Neuromuscular re-education;Therapeutic exercise;Therapeutic activities;Patient/family education;Manual techniques;Dry needling;Taping;Spinal Manipulations;Joint Manipulations    PT Next Visit Plan  Review HEP; DN/manual therapy to left ant lower leg; LE strengthening, core strength, balance    PT Home Exercise Plan  [id number]    Consulted and Agree with Plan of Care  Patient       Patient will benefit from skilled therapeutic intervention in order to improve the following deficits and impairments:  Pain, Increased muscle spasms, Decreased balance, Impaired flexibility, Decreased strength  Visit Diagnosis: Pain in left lower leg - Plan: PT plan of care cert/re-cert  Left low back pain, unspecified chronicity, unspecified whether sciatica present - Plan: PT plan of care cert/re-cert  Muscle weakness (generalized) - Plan: PT plan of care cert/re-cert     Problem List Patient Active Problem List   Diagnosis Date Noted  . Esophageal stricture 01/26/2011  . Arrhythmia 12/29/2010  . Essential hypertension 07/21/2010  . CONTACT DERMATITIS&OTHER ECZEMA DUE UNSPEC CAUSE 04/07/2010  . Hyperlipidemia 09/11/2007  . MIGRAINE HEADACHE 09/11/2007  . ESOPHAGITIS, REFLUX 09/11/2007  . Hypothyroidism 01/26/2007    Madelyn Flavors PT 11/13/2019, 4:39 PM  [facility] [address], [place], [place], [address] Phone: [phone]   Fax:  [phone]  Name: Patricia Solis MRN: [id number] Date of Birth: 1950/09/06

## 2019-11-16 ENCOUNTER — Other Ambulatory Visit: Payer: Self-pay

## 2019-11-16 ENCOUNTER — Encounter: Payer: Self-pay | Admitting: Physical Therapy

## 2019-11-16 ENCOUNTER — Ambulatory Visit: Payer: PPO | Admitting: Physical Therapy

## 2019-11-16 DIAGNOSIS — M6281 Muscle weakness (generalized): Secondary | ICD-10-CM

## 2019-11-16 DIAGNOSIS — M79662 Pain in left lower leg: Secondary | ICD-10-CM

## 2019-11-16 DIAGNOSIS — M545 Low back pain, unspecified: Secondary | ICD-10-CM

## 2019-11-16 NOTE — Therapy (Signed)
[facility] [address] [place], [place], [address] Phone: [phone]   Fax:  [phone]  Physical Therapy Treatment  Patient Details  Name: Patricia Solis MRN: [id number] Date of Birth: 05-22-1951 Referring Provider (PT): Dorothyann Peng NP   Encounter Date: 11/16/2019  PT End of Session - 11/16/19 0847    Visit Number  2    Date for PT Re-Evaluation  01/08/20    PT Start Time  0847    PT Stop Time  0927    PT Time Calculation (min)  40 min    Activity Tolerance  Patient tolerated treatment well    Behavior During Therapy  [facility] for tasks assessed/performed       Past Medical History:  Diagnosis Date  . Basal cell carcinoma 2007   BCC  . Breast cancer ([place])    Invasive lobular carcinoma RIGHT breast  . Carotid bruit 08/2008   Normal Doppler  . DUB (dysfunctional uterine bleeding)   . Esophagitis   . HTN (hypertension)    Tx started 06/2010  . Hyperlipidemia   . Hypothyroidism   . MHA (microangiopathic hemolytic anemia) (HCC)   . Personal history of chemotherapy    2003  . Personal history of radiation therapy    2003 right breast  . Spinal stenosis     Past Surgical History:  Procedure Laterality Date  . BREAST BIOPSY Right    2007 benign  . BREAST LUMPECTOMY Right    2003  . BTL    . CATARACT EXTRACTION, BILATERAL Bilateral 03/26/2016   still blurred vision; Md states this is fine  . CERVICAL LAMINECTOMY     Left  . FOOT SURGERY    . MASTECTOMY W/ NODES PARTIAL     Right  . SHOULDER ARTHROSCOPY Right   . TUBAL LIGATION  1981    There were no vitals filed for this visit.  Subjective Assessment - 11/16/19 0851    Subjective  I hurt more the entire next day after the evaluation. I am doing my exercises but my LT leg hurts standing on it.    Pertinent History  Breast CA 2003, HTN, OA, HA, low back pain    Currently in Pain?  No/denies    Multiple Pain Sites  No                        [facility] Adult PT Treatment/Exercise - 11/16/19 0001      Lumbar Exercises: Stretches   Active Hamstring Stretch  Left;1 rep;20 seconds    Active Hamstring Stretch Limitations  strap in supine    Single Knee to Chest Stretch  Left;1 rep;20 seconds    Single Knee to Chest Stretch Limitations  Lt knee to RT shoulder 20     ITB Stretch  Left;2 reps;20 seconds      Knee/Hip Exercises: Aerobic   Nustep  L2 x 6 min to end session      Knee/Hip Exercises: Standing   Heel Raises  Both;1 set;10 reps    Hip Flexion  Both;10 reps;Knee bent   Good return demo   Hip Abduction  Both;1 set;10 reps;Knee straight   Good return demo   Hip Extension  Both;10 reps      Knee/Hip Exercises: Supine   Bridges  --   2x5 Vc to contract gluteals more: added to HEP     Knee/Hip Exercises: Sidelying   Hip ABduction  Strengthening;Left;2 sets;10 reps  Hip ABduction Limitations  knee bent slightly to improve technique               PT Short Term Goals - 11/13/19 1628      PT SHORT TERM GOAL #1   Title  Ind with initial HEP    Time  3    Period  Weeks    Status  New    Target Date  12/04/19      PT SHORT TERM GOAL #2   Title  Patient to report decreased pain in left lower leg by 25% with ADLS.    Time  3    Period  Weeks    Status  New    Target Date  12/04/19      PT SHORT TERM GOAL #3   Title  Patient to understand safe exercises to perform in gym at [facility].    Time  3    Period  Weeks    Status  New    Target Date  12/04/19        PT Long Term Goals - 11/13/19 1630      PT LONG TERM GOAL #1   Title  Ind with advanced HEP for strength and flexibility.    Time  8    Period  Weeks    Status  New    Target Date  01/08/20      PT LONG TERM GOAL #2   Title  Patient to report improvement in left leg pain by 90% with ADLs.    Time  8    Period  Weeks    Status  New      PT LONG TERM GOAL #3   Title  Improved LE strength to 4+/5 or better  bil and good stabilization of core with MMT to improve function.    Time  8    Period  Weeks    Status  New      PT LONG TERM GOAL #4   Title  Improved SLS on LLE to 10 sec or more to decrease fall risk.    Time  8    Period  Weeks    Status  New      PT LONG TERM GOAL #5   Title  Improved 5x sit to stand to <= 8 seconds to decrease fall risk.    Time  8    Period  Weeks    Status  New            Plan - 11/16/19 [id number]    Clinical Impression Statement  Pt compliant with initial HEP. Minor cues to increase awarness of contracting her glutes more in the moment of single leg stance as this lessened her LTLE symptoms. Supine bridge was added to HEP to facilitate more gluteal contraction/awareness. [facility] for pt's current level. we discussed with stenosis how walking long periods can be difficult so she may need to modify her walking if that continues to be an aggrevating factor to her LTLE. No pain with treatment today.    Personal Factors and Comorbidities  Comorbidity 3+    Comorbidities  Breast CA 2003, HTN, OA, HA, low back pain    Stability/Clinical Decision Making  Evolving/Moderate complexity    Rehab Potential  Excellent    PT Frequency  2x / week    PT Duration  8 weeks    PT Treatment/Interventions  ADLs/Self Care Home Management;Cryotherapy;Dealer  Stimulation;Moist Heat;Iontophoresis 4mg /ml Dexamethasone;Traction;Neuromuscular re-education;Therapeutic exercise;Therapeutic activities;Patient/family education;Manual techniques;Dry needling;Taping;Spinal Manipulations;Joint Manipulations    PT Next Visit Plan  Continue to progress Lt hip strength. Educate pt on lumbar protective body mechanics.    PT Home Exercise Plan  [id number]    Consulted and Agree with Plan of Care  Patient       Patient will benefit from skilled therapeutic intervention in order to improve the following deficits and impairments:  Pain,  Increased muscle spasms, Decreased balance, Impaired flexibility, Decreased strength  Visit Diagnosis: Pain in left lower leg  Left low back pain, unspecified chronicity, unspecified whether sciatica present  Muscle weakness (generalized)     Problem List Patient Active Problem List   Diagnosis Date Noted  . Esophageal stricture 01/26/2011  . Arrhythmia 12/29/2010  . Essential hypertension 07/21/2010  . CONTACT DERMATITIS&OTHER ECZEMA DUE UNSPEC CAUSE 04/07/2010  . Hyperlipidemia 09/11/2007  . MIGRAINE HEADACHE 09/11/2007  . ESOPHAGITIS, REFLUX 09/11/2007  . Hypothyroidism 01/26/2007    Zed Wanninger, PTA 11/16/2019, 9:31 AM  [facility] [address] [place], [place], [address] Phone: [phone]   Fax:  [phone]  Name: Vinita Poehler MRN: [id number] Date of Birth: [birth year]

## 2019-11-21 ENCOUNTER — Other Ambulatory Visit: Payer: Self-pay | Admitting: Obstetrics and Gynecology

## 2019-11-21 DIAGNOSIS — Z1231 Encounter for screening mammogram for malignant neoplasm of breast: Secondary | ICD-10-CM

## 2019-11-28 ENCOUNTER — Ambulatory Visit: Payer: PPO | Attending: Adult Health | Admitting: Physical Therapy

## 2019-11-28 ENCOUNTER — Other Ambulatory Visit: Payer: Self-pay

## 2019-11-28 ENCOUNTER — Encounter: Payer: Self-pay | Admitting: Physical Therapy

## 2019-11-28 DIAGNOSIS — M545 Low back pain, unspecified: Secondary | ICD-10-CM

## 2019-11-28 DIAGNOSIS — M79662 Pain in left lower leg: Secondary | ICD-10-CM | POA: Insufficient documentation

## 2019-11-28 DIAGNOSIS — M6281 Muscle weakness (generalized): Secondary | ICD-10-CM | POA: Diagnosis not present

## 2019-11-28 NOTE — Therapy (Signed)
[facility] [address], [place], [place], [address] Phone: [phone]   Fax:  [phone]  Physical Therapy Treatment  Patient Details  Name: Patricia Solis MRN: [id number] Date of Birth: Oct 12, 1950 Referring Provider (PT): Dorothyann Peng NP   Encounter Date: 11/28/2019  PT End of Session - 11/28/19 1445    Visit Number  3    Date for PT Re-Evaluation  01/08/20    PT Start Time  1446    PT Stop Time  1535    PT Time Calculation (min)  49 min    Activity Tolerance  Patient tolerated treatment well    Behavior During Therapy  [facility] for tasks assessed/performed       Past Medical History:  Diagnosis Date  . Basal cell carcinoma 2007   BCC  . Breast cancer ([place])    Invasive lobular carcinoma RIGHT breast  . Carotid bruit 08/2008   Normal Doppler  . DUB (dysfunctional uterine bleeding)   . Esophagitis   . HTN (hypertension)    Tx started 06/2010  . Hyperlipidemia   . Hypothyroidism   . MHA (microangiopathic hemolytic anemia) (HCC)   . Personal history of chemotherapy    2003  . Personal history of radiation therapy    2003 right breast  . Spinal stenosis     Past Surgical History:  Procedure Laterality Date  . BREAST BIOPSY Right    2007 benign  . BREAST LUMPECTOMY Right    2003  . BTL    . CATARACT EXTRACTION, BILATERAL Bilateral 03/26/2016   still blurred vision; Md states this is fine  . CERVICAL LAMINECTOMY     Left  . FOOT SURGERY    . MASTECTOMY W/ NODES PARTIAL     Right  . SHOULDER ARTHROSCOPY Right   . TUBAL LIGATION  1981    There were no vitals filed for this visit.  Subjective Assessment - 11/28/19 1447    Subjective  My left leg is really hurting and my right shoulder. I was in the shower and reached to pick something up and my whole neck and right shoulder  hurt.    Pertinent History  Breast CA 2003, HTN, OA, HA, low back pain    Patient Stated Goals  get rid of pain    Currently in Pain?  No/denies                       [facility] Adult PT Treatment/Exercise - 11/28/19 0001      Posture/Postural Control   Posture Comments  bil pes planus      Self-Care   Self-Care  Other Self-Care Comments;Posture    Posture  Discussed sitting posture due to short stature and the pressure it puts on peroneal nerve; also discussed posture as it relates to acute neck/shoulder pain pt is having; also discussed sleeping posture and pillow    Other Self-Care Comments   MFR with roller and ball for ITB, lower leg and levator scapula; also discussed shoes and need for arch support      Knee/Hip Exercises: Stretches   Sports administrator  Left;1 rep;30 seconds    Quad Stretch Limitations  passive in [facility]    Hip Flexor Stretch  Left;1 rep;30 seconds    Hip Flexor Stretch Limitations  off EOB    Piriformis Stretch  Left;1 rep;30 seconds    Piriformis Stretch Limitations  knee to opp shoulder; also did in sitting  Other Knee/Hip Stretches  attempted peroneal stretch in sitting but couldn't feel      Knee/Hip Exercises: Aerobic   Nustep  L2 x 6 min PT present to discuss status      Manual Therapy   Manual Therapy  Soft tissue mobilization;Myofascial release    Soft tissue mobilization  to left peroneal muscles in hooklying and SDLY    Myofascial Release  to left ITB mid and distal mainly              PT Education - 11/28/19 1550    Education Details  See self care; HEP progressed    Person(s) Educated  Patient    Methods  Explanation;Demonstration;Handout    Comprehension  Verbalized understanding;Returned demonstration       PT Short Term Goals - 11/13/19 1628      PT SHORT TERM GOAL #1   Title  Ind with initial HEP    Time  3    Period  Weeks    Status  New    Target Date  12/04/19      PT SHORT TERM GOAL #2   Title  Patient to report decreased pain in left lower leg by 25% with ADLS.    Time  3    Period  Weeks    Status  New    Target Date   12/04/19      PT SHORT TERM GOAL #3   Title  Patient to understand safe exercises to perform in gym at [facility].    Time  3    Period  Weeks    Status  New    Target Date  12/04/19        PT Long Term Goals - 11/13/19 1630      PT LONG TERM GOAL #1   Title  Ind with advanced HEP for strength and flexibility.    Time  8    Period  Weeks    Status  New    Target Date  01/08/20      PT LONG TERM GOAL #2   Title  Patient to report improvement in left leg pain by 90% with ADLs.    Time  8    Period  Weeks    Status  New      PT LONG TERM GOAL #3   Title  Improved LE strength to 4+/5 or better bil and good stabilization of core with MMT to improve function.    Time  8    Period  Weeks    Status  New      PT LONG TERM GOAL #4   Title  Improved SLS on LLE to 10 sec or more to decrease fall risk.    Time  8    Period  Weeks    Status  New      PT LONG TERM GOAL #5   Title  Improved 5x sit to stand to <= 8 seconds to decrease fall risk.    Time  8    Period  Weeks    Status  New            Plan - 11/28/19 1550    Clinical Impression Statement  Patient presenting with continued pain into lower left leg intermittently, but mainly from 6-8 pm at night when she sits in her recliner. She also c/o right shoulder and neck pain starting Saturday in shower. She has tight levator scapula and MFR was recommended as well as  cervical retractions. Patient very tight in left peroneals and left ITB. STW/MFR was painful but pt tolerated well and had decreased tissue tension at end of session. Pt inquired whether the way she sat due to short stature had anything to do with pain which is likely since in some circumstances her peroneal nerve is likely being compressed. Pt to monitor symptoms and what she is doing before and during the time sx occur. LTGs are ongoing    Comorbidities  Breast CA 2003, HTN, OA, HA, low back pain    PT Frequency  2x / week    PT Duration  8 weeks    PT  Treatment/Interventions  ADLs/Self Care Home Management;Cryotherapy;Electrical Stimulation;Moist Heat;Iontophoresis 4mg /ml Dexamethasone;Traction;Neuromuscular re-education;Therapeutic exercise;Therapeutic activities;Patient/family education;Manual techniques;Dry needling;Taping;Spinal Manipulations;Joint Manipulations    PT Next Visit Plan  Assess response to manual therapy and continue PRN; f/u with sitting posture; Continue to progress Lt hip strength. Educate pt on lumbar protective body mechanics.    PT Home Exercise Plan  [id number]    Consulted and Agree with Plan of Care  Patient       Patient will benefit from skilled therapeutic intervention in order to improve the following deficits and impairments:  Pain, Increased muscle spasms, Decreased balance, Impaired flexibility, Decreased strength  Visit Diagnosis: Pain in left lower leg  Left low back pain, unspecified chronicity, unspecified whether sciatica present  Muscle weakness (generalized)     Problem List Patient Active Problem List   Diagnosis Date Noted  . Esophageal stricture 01/26/2011  . Arrhythmia 12/29/2010  . Essential hypertension 07/21/2010  . CONTACT DERMATITIS&OTHER ECZEMA DUE UNSPEC CAUSE 04/07/2010  . Hyperlipidemia 09/11/2007  . MIGRAINE HEADACHE 09/11/2007  . ESOPHAGITIS, REFLUX 09/11/2007  . Hypothyroidism 01/26/2007    Madelyn Flavors PT 11/28/2019, 4:02 PM  [facility] [address], [place], [place], [address] Phone: [phone]   Fax:  [phone]  Name: Patricia Solis MRN: [id number] Date of Birth: [birth year]

## 2019-11-28 NOTE — Patient Instructions (Signed)
Access Code: [email or web address] URL: [email or web address] Date: 11/28/2019 Prepared by: Madelyn Flavors  Exercises Standing Hip Abduction with Counter Support - 1 x daily - 7 x weekly - 10 reps - 3 sets Standing March with Counter Support - 1 x daily - 7 x weekly - 10 reps - 3 sets Standing Hip Extension with Counter Support - 1 x daily - 7 x weekly - 10 reps - 3 sets Heel rises with counter support - 1 x daily - 7 x weekly - 3 sets - 10 reps Supine Bridge - 1 x daily - 7 x weekly - 2 sets - 5 reps - 5 hold Supine Piriformis Stretch with Leg Straight - 2 x daily - 7 x weekly - 1 sets - 3 reps - 30 sec hold Seated Cervical Retraction - 3 x daily - 7 x weekly - 10 reps - 1 sets - 3-5 sec hold  Patient Education Trigger Point Dry Needling

## 2019-12-05 ENCOUNTER — Ambulatory Visit: Payer: PPO | Admitting: Physical Therapy

## 2019-12-05 ENCOUNTER — Other Ambulatory Visit: Payer: Self-pay

## 2019-12-05 ENCOUNTER — Encounter: Payer: Self-pay | Admitting: Physical Therapy

## 2019-12-05 DIAGNOSIS — M6281 Muscle weakness (generalized): Secondary | ICD-10-CM

## 2019-12-05 DIAGNOSIS — M79662 Pain in left lower leg: Secondary | ICD-10-CM | POA: Diagnosis not present

## 2019-12-05 DIAGNOSIS — M545 Low back pain, unspecified: Secondary | ICD-10-CM

## 2019-12-05 NOTE — Therapy (Addendum)
[facility] [address] [place], [place], [address] Phone: [phone]   Fax:  [phone]  Physical Therapy Treatment and Discharge Summary  Patient Details  Name: Patricia Solis MRN: [id number] Date of Birth: 1950/08/04 Referring Provider (PT): Dorothyann Peng NP   Encounter Date: 12/05/2019  PT End of Session - 12/05/19 1226    Visit Number  4    Date for PT Re-Evaluation  01/08/20    PT Start Time  1226    PT Stop Time  1304    PT Time Calculation (min)  38 min    Activity Tolerance  Patient tolerated treatment well    Behavior During Therapy  [facility] for tasks assessed/performed       Past Medical History:  Diagnosis Date  . Basal cell carcinoma 2007   BCC  . Breast cancer ([place])    Invasive lobular carcinoma RIGHT breast  . Carotid bruit 08/2008   Normal Doppler  . DUB (dysfunctional uterine bleeding)   . Esophagitis   . HTN (hypertension)    Tx started 06/2010  . Hyperlipidemia   . Hypothyroidism   . MHA (microangiopathic hemolytic anemia) (HCC)   . Personal history of chemotherapy    2003  . Personal history of radiation therapy    2003 right breast  . Spinal stenosis     Past Surgical History:  Procedure Laterality Date  . BREAST BIOPSY Right    2007 benign  . BREAST LUMPECTOMY Right    2003  . BTL    . CATARACT EXTRACTION, BILATERAL Bilateral 03/26/2016   still blurred vision; Md states this is fine  . CERVICAL LAMINECTOMY     Left  . FOOT SURGERY    . MASTECTOMY W/ NODES PARTIAL     Right  . SHOULDER ARTHROSCOPY Right   . TUBAL LIGATION  1981    There were no vitals filed for this visit.  Subjective Assessment - 12/05/19 1228    Subjective  I have had no pain since the manual work last session. [place] rolling pin and will continue to use that at home.    Pertinent History  Breast CA 2003, HTN, OA, HA, low back pain    Currently in Pain?  No/denies    Multiple Pain Sites  No          [facility] PT Assessment - 12/05/19 0001      Observation/Other Assessments   Focus on Therapeutic Outcomes (FOTO)   15% limitation      Strength   Right Hip Flexion  4/5    Right Hip Extension  4-/5    Right Hip ABduction  4+/5    Left Hip Flexion  4+/5    Left Hip Extension  4/5    Left Hip ABduction  4+/5                    [facility] Adult PT Treatment/Exercise - 12/05/19 0001      Knee/Hip Exercises: Standing   Hip Flexion  Stengthening;Both;1 set;10 reps;Knee bent    Hip Flexion Limitations  red band added    Hip Abduction  Stengthening;Left;1 set;10 reps;Knee straight    Abduction Limitations  added red band to progress    Hip Extension  Stengthening;Both;1 set;10 reps;Knee straight    Extension Limitations  added red band for home progression      Knee/Hip Exercises: Seated   Sit to Sand  --   5x sit to stand  8 sec              PT Short Term Goals - 12/05/19 1239      PT SHORT TERM GOAL #1   Title  Ind with initial HEP    Time  3    Period  Weeks    Status  Achieved    Target Date  12/04/19      PT SHORT TERM GOAL #2   Title  Patient to report decreased pain in left lower leg by 25% with ADLS.    Period  Weeks    Status  Achieved      PT SHORT TERM GOAL #3   Title  Patient to understand safe exercises to perform in gym at [facility].    Time  3    Period  Weeks    Status  Achieved    Target Date  12/04/19        PT Long Term Goals - 12/05/19 1240      PT LONG TERM GOAL #1   Title  Ind with advanced HEP for strength and flexibility.    Time  8    Period  Weeks    Status  Achieved      PT LONG TERM GOAL #2   Title  Patient to report improvement in left leg pain by 90% with ADLs.    Time  8    Period  Weeks    Status  Achieved   100%     PT LONG TERM GOAL #3   Title  Improved LE strength to 4+/5 or better bil and good stabilization of core with MMT to improve function.    Time  8    Period  Weeks    Status  Partially Met       PT LONG TERM GOAL #4   Title  Improved SLS on LLE to 10 sec or more to decrease fall risk.    Time  8    Period  Weeks    Status  Achieved      PT LONG TERM GOAL #5   Title  Improved 5x sit to stand to <= 8 seconds to decrease fall risk.    Time  8    Period  Weeks    Status  Achieved            Plan - 12/05/19 1227    Clinical Impression Statement  Pt has not had any pain since her last session. She has purchased a rolling pin to continue working on her soft tissue mobility. She can single leg stance 10 sec although it is a little wobbly. She will continue to work on this at home. we added red band resisatnce to her standing hip kicks to progress her strengthening at home. FOTO was also improved significantly. Pt has essentially met all her goals at this time. She agreed to be put on hold x1 week and either call for DC if she continues to do well or attend that visit to continue with her POC. MMT all improved.    Personal Factors and Comorbidities  Comorbidity 3+    Comorbidities  Breast CA 2003, HTN, OA, HA, low back pain    Stability/Clinical Decision Making  Evolving/Moderate complexity    Rehab Potential  Excellent    PT Frequency  2x / week    PT Duration  8 weeks    PT Treatment/Interventions  ADLs/Self Care Home Management;Cryotherapy;Electrical Stimulation;Moist Heat;Iontophoresis 9m/ml Dexamethasone;Traction;Neuromuscular re-education;Therapeutic exercise;Therapeutic  activities;Patient/family education;Manual techniques;Dry needling;Taping;Spinal Manipulations;Joint Manipulations    PT Next Visit Plan  Pt put on hold x 1 week. will call to DC if she continues to do well.    PT Home Exercise Plan  [email or web address]    Consulted and Agree with Plan of Care  Patient       Patient will benefit from skilled therapeutic intervention in order to improve the following deficits and impairments:  Pain, Increased muscle spasms, Decreased balance, Impaired flexibility, Decreased  strength  Visit Diagnosis: Pain in left lower leg  Left low back pain, unspecified chronicity, unspecified whether sciatica present  Muscle weakness (generalized)     Problem List Patient Active Problem List   Diagnosis Date Noted  . Esophageal stricture 01/26/2011  . Arrhythmia 12/29/2010  . Essential hypertension 07/21/2010  . CONTACT DERMATITIS&OTHER ECZEMA DUE UNSPEC CAUSE 04/07/2010  . Hyperlipidemia 09/11/2007  . MIGRAINE HEADACHE 09/11/2007  . ESOPHAGITIS, REFLUX 09/11/2007  . Hypothyroidism 01/26/2007    Myrene Galas, PTA 12/05/19 1:08 PM  [facility] [address] [place], [place], [address] Phone: [phone]   Fax:  [phone]  Name: Patricia Solis MRN: [id number] Date of Birth: [birth year] Access Code: [email or web address]: [email or web address]: 05/12/2021Prepared by: Anderson Malta CochranExercises  Standing Hip Abduction with Counter Support - 1 x daily - 7 x weekly - 10 reps - 3 sets  Standing March with Counter Support - 1 x daily - 7 x weekly - 10 reps - 3 sets  Standing Hip Extension with Counter Support - 1 x daily - 7 x weekly - 10 reps - 3 sets  Heel rises with counter support - 1 x daily - 7 x weekly - 3 sets - 10 reps  Supine Bridge - 1 x daily - 7 x weekly - 2 sets - 5 reps - 5 hold  Supine Piriformis Stretch with Leg Straight - 2 x daily - 7 x weekly - 1 sets - 3 reps - 30 sec hold  Seated Cervical Retraction - 3 x daily - 7 x weekly - 10 reps - 1 sets - 3-5 sec hold  Single Leg Stance with Support - 3 x daily - 7 x weekly - 3 sets - 20 hold Patient Education  Trigger Point Dry Needling  PHYSICAL THERAPY DISCHARGE SUMMARY  Visits from Start of Care: 4  Current functional level related to goals / functional outcomes: See above   Remaining deficits: See above   Education / Equipment: HEp Plan: Patient agrees to discharge.  Patient goals were partially met.  Patient is being discharged due to being pleased with the current functional level.  ?????    Madelyn Flavors, PT 12/18/19 9:12 AM [facility] [address], [place], [place] [address] Phone # [phone] Fax [phone]

## 2019-12-11 ENCOUNTER — Encounter: Payer: PPO | Admitting: Physical Therapy

## 2019-12-13 ENCOUNTER — Encounter: Payer: PPO | Admitting: Physical Therapy

## 2019-12-18 ENCOUNTER — Encounter: Payer: PPO | Admitting: Physical Therapy

## 2019-12-20 ENCOUNTER — Encounter: Payer: PPO | Admitting: Physical Therapy

## 2019-12-25 ENCOUNTER — Encounter: Payer: PPO | Admitting: Physical Therapy

## 2019-12-27 ENCOUNTER — Encounter: Payer: PPO | Admitting: Physical Therapy

## 2020-01-01 ENCOUNTER — Encounter: Payer: PPO | Admitting: Physical Therapy

## 2020-01-03 ENCOUNTER — Encounter: Payer: PPO | Admitting: Physical Therapy

## 2020-01-08 ENCOUNTER — Encounter: Payer: PPO | Admitting: Physical Therapy

## 2020-01-10 ENCOUNTER — Encounter: Payer: PPO | Admitting: Physical Therapy

## 2020-01-15 ENCOUNTER — Encounter: Payer: PPO | Admitting: Physical Therapy

## 2020-01-17 ENCOUNTER — Encounter: Payer: PPO | Admitting: Physical Therapy

## 2020-01-23 ENCOUNTER — Encounter: Payer: Self-pay | Admitting: Adult Health

## 2020-01-24 ENCOUNTER — Other Ambulatory Visit: Payer: Self-pay | Admitting: Adult Health

## 2020-01-24 DIAGNOSIS — M79605 Pain in left leg: Secondary | ICD-10-CM

## 2020-01-24 DIAGNOSIS — G8929 Other chronic pain: Secondary | ICD-10-CM

## 2020-01-25 DIAGNOSIS — L570 Actinic keratosis: Secondary | ICD-10-CM | POA: Diagnosis not present

## 2020-01-25 DIAGNOSIS — H01119 Allergic dermatitis of unspecified eye, unspecified eyelid: Secondary | ICD-10-CM | POA: Diagnosis not present

## 2020-01-25 DIAGNOSIS — L814 Other melanin hyperpigmentation: Secondary | ICD-10-CM | POA: Diagnosis not present

## 2020-01-25 DIAGNOSIS — D225 Melanocytic nevi of trunk: Secondary | ICD-10-CM | POA: Diagnosis not present

## 2020-01-25 DIAGNOSIS — D1801 Hemangioma of skin and subcutaneous tissue: Secondary | ICD-10-CM | POA: Diagnosis not present

## 2020-01-25 DIAGNOSIS — D2371 Other benign neoplasm of skin of right lower limb, including hip: Secondary | ICD-10-CM | POA: Diagnosis not present

## 2020-01-25 DIAGNOSIS — L578 Other skin changes due to chronic exposure to nonionizing radiation: Secondary | ICD-10-CM | POA: Diagnosis not present

## 2020-01-25 DIAGNOSIS — L821 Other seborrheic keratosis: Secondary | ICD-10-CM | POA: Diagnosis not present

## 2020-01-25 DIAGNOSIS — Z85828 Personal history of other malignant neoplasm of skin: Secondary | ICD-10-CM | POA: Diagnosis not present

## 2020-01-29 ENCOUNTER — Other Ambulatory Visit: Payer: Self-pay

## 2020-01-29 ENCOUNTER — Ambulatory Visit
Admission: RE | Admit: 2020-01-29 | Discharge: 2020-01-29 | Disposition: A | Discharge status: Home / Self Care | Payer: PPO | Source: Ambulatory Visit | Attending: Obstetrics and Gynecology | Admitting: Obstetrics and Gynecology

## 2020-01-29 DIAGNOSIS — Z1231 Encounter for screening mammogram for malignant neoplasm of breast: Secondary | ICD-10-CM | POA: Diagnosis not present

## 2020-01-31 ENCOUNTER — Other Ambulatory Visit: Payer: Self-pay | Admitting: Obstetrics and Gynecology

## 2020-01-31 DIAGNOSIS — R928 Other abnormal and inconclusive findings on diagnostic imaging of breast: Secondary | ICD-10-CM

## 2020-02-01 ENCOUNTER — Other Ambulatory Visit: Payer: Self-pay

## 2020-02-01 ENCOUNTER — Encounter: Payer: Self-pay | Admitting: Family Medicine

## 2020-02-01 ENCOUNTER — Ambulatory Visit (INDEPENDENT_AMBULATORY_CARE_PROVIDER_SITE_OTHER): Payer: PPO

## 2020-02-01 ENCOUNTER — Ambulatory Visit (INDEPENDENT_AMBULATORY_CARE_PROVIDER_SITE_OTHER): Payer: PPO | Admitting: Family Medicine

## 2020-02-01 DIAGNOSIS — M5442 Lumbago with sciatica, left side: Secondary | ICD-10-CM

## 2020-02-01 DIAGNOSIS — M79605 Pain in left leg: Secondary | ICD-10-CM

## 2020-02-01 NOTE — Progress Notes (Signed)
Office Visit Note   Patient: Patricia Solis           Date of Birth: July 28, 1950           MRN: [id number] Visit Date: 02/01/2020 Requested by: Dorothyann Peng, NP [place],  [place] [address] PCP: Dorothyann Peng, NP  Subjective: Chief Complaint  Patient presents with  . Left Leg - Pain    HPI: She is here with left lower leg pain.  She first sought treatment for this in October of last year, but she was having symptoms long before that.  She states that she intermittently gets a severe pain in the left lateral calf with occasional radiation into the lateral foot.  Lately the pain has been radiating up toward the knee as well.  She denies any back pain with this, although she states that she was told she has spinal stenosis based on MRI scan done at [facility] years ago.  She went to physical therapy for this pain and it improved for a little while, but the pain keeps coming back.  Today she is asymptomatic.  She never knows when it is going to affect her.  She states that when she first went to physical therapy, she had substantial weakness in her ankle but that seems to have improved.  Denies any numbness or tingling, denies any fevers or chills.               ROS:   All other systems were reviewed and are negative.  Objective: Vital Signs: There were no vitals taken for this visit.  Physical Exam:  General:  Alert and oriented, in no acute distress. Pulm:  Breathing unlabored. Psy:  Normal mood, congruent affect. Skin: No visible rash Low back: She has no significant tenderness to palpation of the spine or the sciatic notch.  Straight leg raise negative, stork test is negative.  No pain with internal hip rotation.  Lower extremity strength and reflexes are normal except for ankle dorsiflexion on the left which is weak at 4/5.   Imaging: XR Lumbar Spine 2-3 Views  Result Date: 02/01/2020 X-rays lumbar spine reveal mild degenerative disc disease at  multiple levels with facet DJD.  No sign of neoplasm or compression fracture.  Hip joints are well-preserved.   Assessment & Plan: 1.  Chronic left lower leg pain with ankle dorsiflexion weakness, concerning for peroneal nerve entrapment versus lumbar foraminal stenosis. -Discussed options with her and elected to proceed with nerve conduction studies.  We will come up with a treatment plan based on the results.     Procedures: No procedures performed  No notes on file     PMFS History: Patient Active Problem List   Diagnosis Date Noted  . Esophageal stricture 01/26/2011  . Arrhythmia 12/29/2010  . Essential hypertension 07/21/2010  . CONTACT DERMATITIS&OTHER ECZEMA DUE UNSPEC CAUSE 04/07/2010  . Hyperlipidemia 09/11/2007  . MIGRAINE HEADACHE 09/11/2007  . ESOPHAGITIS, REFLUX 09/11/2007  . Hypothyroidism 01/26/2007   Past Medical History:  Diagnosis Date  . Basal cell carcinoma 2007   [facility]  . Breast cancer ([place])    Invasive lobular carcinoma RIGHT breast  . Carotid bruit 08/2008   Normal Doppler  . DUB (dysfunctional uterine bleeding)   . Esophagitis   . HTN (hypertension)    Tx started 06/2010  . Hyperlipidemia   . Hypothyroidism   . MHA (microangiopathic hemolytic anemia) (HCC)   . Personal history of chemotherapy    2003  .  Personal history of radiation therapy    2003 right breast  . Spinal stenosis     Family History  Problem Relation Age of Onset  . Heart attack Father   . Coronary artery disease Father   . Diabetes Father   . Coronary artery disease Mother   . Leukemia Mother   . Thyroid disease Mother   . Multiple sclerosis Sister        Twin  . Breast cancer Maternal Aunt     Past Surgical History:  Procedure Laterality Date  . BREAST BIOPSY Right    2007 benign  . BREAST LUMPECTOMY Right    2003  . BTL    . CATARACT EXTRACTION, BILATERAL Bilateral 03/26/2016   still blurred vision; Md states this is fine  . CERVICAL LAMINECTOMY     Left    . FOOT SURGERY    . MASTECTOMY W/ NODES PARTIAL     Right  . SHOULDER ARTHROSCOPY Right   . TUBAL LIGATION  1981   Social History   Occupational History  . Not on file  Tobacco Use  . Smoking status: Never Smoker  . Smokeless tobacco: Never Used  Substance and Sexual Activity  . Alcohol use: No  . Drug use: No  . Sexual activity: Not on file

## 2020-02-04 ENCOUNTER — Telehealth: Payer: Self-pay

## 2020-02-04 NOTE — Telephone Encounter (Signed)
I called: the patient was actually calling about nerve conduction studies that were to be set up with Dr. Ernestina Patches. The patient was unsure if she had to call and set it up or if someone will be calling her. I advised her that Dr. Romona Curls assistant will be calling her to set up this appointment.

## 2020-02-04 NOTE — Telephone Encounter (Signed)
Patient called in to sch her appt for a stress test.

## 2020-02-06 ENCOUNTER — Ambulatory Visit
Admission: RE | Admit: 2020-02-06 | Discharge: 2020-02-06 | Disposition: A | Discharge status: Home / Self Care | Payer: PPO | Source: Ambulatory Visit | Attending: Obstetrics and Gynecology | Admitting: Obstetrics and Gynecology

## 2020-02-06 ENCOUNTER — Ambulatory Visit: Payer: PPO

## 2020-02-06 ENCOUNTER — Other Ambulatory Visit: Payer: Self-pay

## 2020-02-06 DIAGNOSIS — R922 Inconclusive mammogram: Secondary | ICD-10-CM | POA: Diagnosis not present

## 2020-02-06 DIAGNOSIS — R928 Other abnormal and inconclusive findings on diagnostic imaging of breast: Secondary | ICD-10-CM

## 2020-02-06 DIAGNOSIS — Z853 Personal history of malignant neoplasm of breast: Secondary | ICD-10-CM | POA: Diagnosis not present

## 2020-02-19 DIAGNOSIS — Z124 Encounter for screening for malignant neoplasm of cervix: Secondary | ICD-10-CM | POA: Diagnosis not present

## 2020-02-19 DIAGNOSIS — Z6828 Body mass index (BMI) 28.0-28.9, adult: Secondary | ICD-10-CM | POA: Diagnosis not present

## 2020-02-19 DIAGNOSIS — M8588 Other specified disorders of bone density and structure, other site: Secondary | ICD-10-CM | POA: Diagnosis not present

## 2020-02-19 DIAGNOSIS — N958 Other specified menopausal and perimenopausal disorders: Secondary | ICD-10-CM | POA: Diagnosis not present

## 2020-03-21 ENCOUNTER — Other Ambulatory Visit: Payer: Self-pay

## 2020-03-21 ENCOUNTER — Ambulatory Visit: Payer: PPO | Admitting: Physical Medicine and Rehabilitation

## 2020-03-21 DIAGNOSIS — R202 Paresthesia of skin: Secondary | ICD-10-CM

## 2020-03-21 NOTE — Progress Notes (Signed)
Pt state left calf pain that travels up to her knee. Pt state last night she felt the pain travel to her hip area. Pt state she has pain going up stairs.  Numeric Pain Rating Scale and Functional Assessment Average Pain 6   In the last MONTH (on 0-10 scale) has pain interfered with the following?  1. General activity like being  able to carry out your everyday physical activities such as walking, climbing stairs, carrying groceries, or moving a chair?  Rating(10)

## 2020-03-25 NOTE — Progress Notes (Signed)
Patricia Solis - 69 y.o. female MRN [id number]  Date of birth: 04/12/1951  Office Visit Note: Visit Date: 03/21/2020 PCP: Dorothyann Peng, NP Referred by: Dorothyann Peng, NP  Subjective: Chief Complaint  Patient presents with  . Left Knee - Pain  . Left Leg - Pain   HPI:  Patricia Solis is a 69 y.o. female who comes in today at the request of Dr. Eunice Blase for electrodiagnostic study of the Left lower extremity.  Patient endorses chronic worsening left calf pain that can travel up into the knee laterally.  She points to the area over the left fibular head and the course of what used to be called the peroneal now fibular nerve.  She reports that last night she felt some pain traveling up into the left lateral hip.  She reports mechanical type pain going up and down the stairs.  No right-sided complaints.  No focal weakness.  No complaints of color changes or sweating or allodynia.  No prior electrodiagnostic study.  No history of diabetes.  She has not had lumbar spine MRI or advanced imaging.  X-rays of the lumbar spine fairly unrevealing.  ROS Otherwise per HPI.  Assessment & Plan: Visit Diagnoses:  1. Paresthesia of skin     Plan: Impression: Essentially NORMAL electrodiagnostic study of the left lower limb.  There is no significant electrodiagnostic evidence of nerve entrapment, lumbosacral plexopathy or lumbar radiculopathy.    As you know, purely sensory or demyelinating radiculopathies and chemical radiculitis may not be detected with this particular electrodiagnostic study.  Recommendations: 1.  Follow-up with referring physician. 2.  Continue current management of symptoms.  Meds & Orders: No orders of the defined types were placed in this encounter.   Orders Placed This Encounter  Procedures  . NCV with EMG (electromyography)    Follow-up: Return for Eunice Blase, MD.   Procedures: No procedures performed  EMG & NCV Findings: Evaluation of the left  fibular motor nerve showed reduced amplitude (0.9 mV).  All remaining nerves (as indicated in the following tables) were within normal limits.    All examined muscles (as indicated in the following table) showed no evidence of electrical instability.    Impression: Essentially NORMAL electrodiagnostic study of the left lower limb.  There is no significant electrodiagnostic evidence of nerve entrapment, lumbosacral plexopathy or lumbar radiculopathy.    As you know, purely sensory or demyelinating radiculopathies and chemical radiculitis may not be detected with this particular electrodiagnostic study.  Recommendations: 1.  Follow-up with referring physician. 2.  Continue current management of symptoms.  ___________________________ Laurence Spates FAAPMR Board Certified, American Board of Physical Medicine and Rehabilitation    Nerve Conduction Studies Anti Sensory Summary Table   Stim Site NR Peak (ms) Norm Peak (ms) P-T Amp (V) Norm P-T Amp Site1 Site2 Delta-P (ms) Dist (cm) Vel (m/s) Norm Vel (m/s)  Left Saphenous Anti Sensory (Ant Med Mall)  27.6C  14cm    4.1 <4.4 6.0 >2 14cm Ant Med Mall 4.1 0.0  >32  Left Sup Fibular Anti Sensory (Ant Lat Mall)  27.7C  14 cm    4.2 <4.4 9.6 >5.0 14 cm Ant Lat Mall 4.2 14.0 33 >32  Left Sural Anti Sensory (Lat Mall)  27.6C  Calf    3.9 <4.0 8.7 >5.0 Calf Lat Mall 3.9 14.0 36 >35   Motor Summary Table   Stim Site NR Onset (ms) Norm Onset (ms) O-P Amp (mV) Norm O-P Amp Site1 Site2 Delta-0 (  ms) Dist (cm) Vel (m/s) Norm Vel (m/s)  Left Fibular Motor (Ext Dig Brev)  27.7C  Ankle    4.0 <6.1 *0.9 >2.5 B Fib Ankle 5.8 29.0 50 >38  B Fib    9.8  1.5  Poplt B Fib 1.5 9.0 60 >40  Poplt    11.3  1.7         Left Tibial Motor (Abd Hall Brev)  27.8C  Ankle    5.0 <6.1 4.8 >3.0 Knee Ankle 6.5 39.0 60 >35  Knee    11.5  11.1          EMG   Side Muscle Nerve Root Ins Act Fibs Psw Amp Dur Poly Recrt Int Fraser Din Comment  Left Fibularis Longus  Sup Br  Peron L5-S1 Nml Nml Nml Nml Nml 0 Nml Nml   Left MedGastroc Tibial S1-2 Nml Nml Nml Nml Nml 0 Nml Nml   Left VastusMed Femoral L2-4 Nml Nml Nml Nml Nml 0 Nml Nml   Left BicepsFemS Sciatic L5-S1 Nml Nml Nml Nml Nml 0 Nml Nml   Left AntTibialis Dp Br Peron L4-5 Nml Nml Nml Nml Nml 0 Nml Nml     Nerve Conduction Studies Anti Sensory Left/Right Comparison   Stim Site L Lat (ms) R Lat (ms) L-R Lat (ms) L Amp (V) R Amp (V) L-R Amp (%) Site1 Site2 L Vel (m/s) R Vel (m/s) L-R Vel (m/s)  Saphenous Anti Sensory (Ant Med Mall)  27.6C  14cm 4.1   6.0   14cm Ant Med Mall     Sup Fibular Anti Sensory (Ant Lat Mall)  27.7C  14 cm 4.2   9.6   14 cm Ant Lat Mall 33    Sural Anti Sensory (Lat Mall)  27.6C  Calf 3.9   8.7   Calf Lat Mall 36     Motor Left/Right Comparison   Stim Site L Lat (ms) R Lat (ms) L-R Lat (ms) L Amp (mV) R Amp (mV) L-R Amp (%) Site1 Site2 L Vel (m/s) R Vel (m/s) L-R Vel (m/s)  Fibular Motor (Ext Dig Brev)  27.7C  Ankle 4.0   *0.9   B Fib Ankle 50    B Fib 9.8   1.5   Poplt B Fib 60    Poplt 11.3   1.7         Tibial Motor (Abd Hall Brev)  27.8C  Ankle 5.0   4.8   Knee Ankle 60    Knee 11.5   11.1            Waveforms:             Clinical History: No specialty comments available.     Objective:  VS:  HT:    WT:   BMI:     BP:   HR: bpm  TEMP: ( )  RESP:  Physical Exam Vitals and nursing note reviewed.  Constitutional:      General: She is not in acute distress.    Appearance: Normal appearance. She is well-developed. She is not ill-appearing.  HENT:     Head: Normocephalic and atraumatic.  Eyes:     Conjunctiva/sclera: Conjunctivae normal.     Pupils: Pupils are equal, round, and reactive to light.  Cardiovascular:     Rate and Rhythm: Normal rate.     Pulses: Normal pulses.  Pulmonary:     Effort: Pulmonary effort is normal.  Musculoskeletal:        General: Tenderness  present.     Right lower leg: No edema.     Left lower leg: No  edema.     Comments: Examination of both lower extremities shows normal muscle bulk and tone.  There is intact sensation to light touch in all dermatomal and peripheral nerve distributions.  There is some tenderness over the left fibular head but really negative Tinel's.  There is some intrinsic muscle atrophy of the feet bilaterally.  There is no swelling.  Skin:    General: Skin is warm and dry.     Findings: No erythema or rash.  Neurological:     General: No focal deficit present.     Mental Status: She is alert and oriented to person, place, and time.     Sensory: No sensory deficit.     Motor: No abnormal muscle tone.     Coordination: Coordination normal.     Gait: Gait normal.  Psychiatric:        Mood and Affect: Mood normal.        Behavior: Behavior normal.      Imaging: No results found.

## 2020-03-25 NOTE — Procedures (Signed)
EMG & NCV Findings: Evaluation of the left fibular motor nerve showed reduced amplitude (0.9 mV).  All remaining nerves (as indicated in the following tables) were within normal limits.    All examined muscles (as indicated in the following table) showed no evidence of electrical instability.    Impression: Essentially NORMAL electrodiagnostic study of the left lower limb.  There is no significant electrodiagnostic evidence of nerve entrapment, lumbosacral plexopathy or lumbar radiculopathy.    As you know, purely sensory or demyelinating radiculopathies and chemical radiculitis may not be detected with this particular electrodiagnostic study.  Recommendations: 1.  Follow-up with referring physician. 2.  Continue current management of symptoms.  ___________________________ Laurence Spates FAAPMR Board Certified, American Board of Physical Medicine and Rehabilitation    Nerve Conduction Studies Anti Sensory Summary Table   Stim Site NR Peak (ms) Norm Peak (ms) P-T Amp (V) Norm P-T Amp Site1 Site2 Delta-P (ms) Dist (cm) Vel (m/s) Norm Vel (m/s)  Left Saphenous Anti Sensory (Ant Med Mall)  27.6C  14cm    4.1 <4.4 6.0 >2 14cm Ant Med Mall 4.1 0.0  >32  Left Sup Fibular Anti Sensory (Ant Lat Mall)  27.7C  14 cm    4.2 <4.4 9.6 >5.0 14 cm Ant Lat Mall 4.2 14.0 33 >32  Left Sural Anti Sensory (Lat Mall)  27.6C  Calf    3.9 <4.0 8.7 >5.0 Calf Lat Mall 3.9 14.0 36 >35   Motor Summary Table   Stim Site NR Onset (ms) Norm Onset (ms) O-P Amp (mV) Norm O-P Amp Site1 Site2 Delta-0 (ms) Dist (cm) Vel (m/s) Norm Vel (m/s)  Left Fibular Motor (Ext Dig Brev)  27.7C  Ankle    4.0 <6.1 *0.9 >2.5 B Fib Ankle 5.8 29.0 50 >38  B Fib    9.8  1.5  Poplt B Fib 1.5 9.0 60 >40  Poplt    11.3  1.7         Left Tibial Motor (Abd Hall Brev)  27.8C  Ankle    5.0 <6.1 4.8 >3.0 Knee Ankle 6.5 39.0 60 >35  Knee    11.5  11.1          EMG   Side Muscle Nerve Root Ins Act Fibs Psw Amp Dur Poly Recrt Int Fraser Din  Comment  Left Fibularis Longus  Sup Br Peron L5-S1 Nml Nml Nml Nml Nml 0 Nml Nml   Left MedGastroc Tibial S1-2 Nml Nml Nml Nml Nml 0 Nml Nml   Left VastusMed Femoral L2-4 Nml Nml Nml Nml Nml 0 Nml Nml   Left BicepsFemS Sciatic L5-S1 Nml Nml Nml Nml Nml 0 Nml Nml   Left AntTibialis Dp Br Peron L4-5 Nml Nml Nml Nml Nml 0 Nml Nml     Nerve Conduction Studies Anti Sensory Left/Right Comparison   Stim Site L Lat (ms) R Lat (ms) L-R Lat (ms) L Amp (V) R Amp (V) L-R Amp (%) Site1 Site2 L Vel (m/s) R Vel (m/s) L-R Vel (m/s)  Saphenous Anti Sensory (Ant Med Mall)  27.6C  14cm 4.1   6.0   14cm Ant Med Mall     Sup Fibular Anti Sensory (Ant Lat Mall)  27.7C  14 cm 4.2   9.6   14 cm Ant Lat Mall 33    Sural Anti Sensory (Lat Mall)  27.6C  Calf 3.9   8.7   Calf Lat Mall 36     Motor Left/Right Comparison   Stim Site L Lat (ms)  R Lat (ms) L-R Lat (ms) L Amp (mV) R Amp (mV) L-R Amp (%) Site1 Site2 L Vel (m/s) R Vel (m/s) L-R Vel (m/s)  Fibular Motor (Ext Dig Brev)  27.7C  Ankle 4.0   *0.9   B Fib Ankle 50    B Fib 9.8   1.5   Poplt B Fib 60    Poplt 11.3   1.7         Tibial Motor (Abd Hall Brev)  27.8C  Ankle 5.0   4.8   Knee Ankle 60    Knee 11.5   11.1            Waveforms:

## 2020-03-26 ENCOUNTER — Ambulatory Visit (INDEPENDENT_AMBULATORY_CARE_PROVIDER_SITE_OTHER): Payer: PPO | Admitting: Family Medicine

## 2020-03-26 ENCOUNTER — Other Ambulatory Visit: Payer: Self-pay

## 2020-03-26 ENCOUNTER — Encounter: Payer: Self-pay | Admitting: Family Medicine

## 2020-03-26 DIAGNOSIS — M79641 Pain in right hand: Secondary | ICD-10-CM | POA: Diagnosis not present

## 2020-03-26 DIAGNOSIS — M5442 Lumbago with sciatica, left side: Secondary | ICD-10-CM

## 2020-03-26 DIAGNOSIS — M79605 Pain in left leg: Secondary | ICD-10-CM

## 2020-03-26 DIAGNOSIS — R200 Anesthesia of skin: Secondary | ICD-10-CM

## 2020-03-26 DIAGNOSIS — M79642 Pain in left hand: Secondary | ICD-10-CM | POA: Diagnosis not present

## 2020-03-26 NOTE — Progress Notes (Signed)
Office Visit Note   Patient: Patricia Solis           Date of Birth: September 19, 1950           MRN: [id number] Visit Date: 03/26/2020 Requested by: Dorothyann Peng, NP [place],  [place] [address] PCP: Dorothyann Peng, NP  Subjective: Chief Complaint  Patient presents with   Left Lower Leg - Pain, Follow-up    S/p NCS/EMG with Dr. Ernestina Patches. No change in symptoms from last ov   pain & stiffness in fingers of both hands    HPI: She is here for follow-up chronic left leg pain and numbness.  Nerve studies were normal.  She is frustrated by her ongoing symptoms and the fact that we do not yet have a diagnosis.  She does have lumbar spinal stenosis but no back pain associated with this.  Recently her hands have been very stiff, difficult to make a fist with her fingers.  She does have a family history of arthritis and possibly gout, she is not sure if other rheumatologic disease is present in the family.              ROS:   All other systems were reviewed and are negative.  Objective: Vital Signs: There were no vitals taken for this visit.  Physical Exam:  General:  Alert and oriented, in no acute distress. Pulm:  Breathing unlabored. Psy:  Normal mood, congruent affect. Skin: No rash Hands: She has stiffness of all fingers with flexion.  She has no synovitis, but appears to have DJD in most of her fingers. Legs: Ankle dorsiflexion and eversion strength is 5/5 today bilaterally.  Imaging: No results found.  Assessment & Plan: 1.  Chronic left leg pain and numbness, possibly due to spinal stenosis.  Cannot rule out rheumatologic disease. -We will order some additional labs. -Could contemplate epidural injection if labs are unrevealing.  2.  Bilateral hand stiffness with probable osteoarthritis -Labs to evaluate.     Procedures: No procedures performed  No notes on file     PMFS History: Patient Active Problem List   Diagnosis Date Noted   Esophageal  stricture 01/26/2011   Arrhythmia 12/29/2010   Essential hypertension 07/21/2010   CONTACT DERMATITIS&OTHER ECZEMA DUE UNSPEC CAUSE 04/07/2010   Hyperlipidemia 09/11/2007   MIGRAINE HEADACHE 09/11/2007   ESOPHAGITIS, REFLUX 09/11/2007   Hypothyroidism 01/26/2007   Past Medical History:  Diagnosis Date   Basal cell carcinoma 2007   [place]   Breast cancer ([place])    Invasive lobular carcinoma RIGHT breast   Carotid bruit 08/2008   Normal Doppler   DUB (dysfunctional uterine bleeding)    Esophagitis    HTN (hypertension)    Tx started 06/2010   Hyperlipidemia    Hypothyroidism    MHA (microangiopathic hemolytic anemia) (HCC)    Personal history of chemotherapy    2003   Personal history of radiation therapy    2003 right breast   Spinal stenosis     Family History  Problem Relation Age of Onset   Heart attack Father    Coronary artery disease Father    Diabetes Father    Coronary artery disease Mother    Leukemia Mother    Thyroid disease Mother    Multiple sclerosis Sister        Twin   Breast cancer Maternal Aunt     Past Surgical History:  Procedure Laterality Date   BREAST BIOPSY Right  2007 benign   BREAST LUMPECTOMY Right    2003   BTL     CATARACT EXTRACTION, BILATERAL Bilateral 03/26/2016   still blurred vision; Md states this is fine   CERVICAL LAMINECTOMY     Left   FOOT SURGERY     SHOULDER ARTHROSCOPY Right    TUBAL LIGATION  1981   Social History   Occupational History   Not on file  Tobacco Use   Smoking status: Never Smoker   Smokeless tobacco: Never Used  Substance and Sexual Activity   Alcohol use: No   Drug use: No   Sexual activity: Not on file

## 2020-03-27 LAB — C-REACTIVE PROTEIN: CRP: 1.4 mg/L (ref <8.0)

## 2020-03-27 LAB — ANA: Anti Nuclear Antibody (ANA): NEGATIVE

## 2020-03-27 LAB — URIC ACID: Uric Acid, Serum: 3.9 mg/dL (ref 2.5–7.0)

## 2020-03-27 LAB — SEDIMENTATION RATE: Sed Rate: 2 mm/h (ref 0–30)

## 2020-03-27 LAB — CYCLIC CITRUL PEPTIDE ANTIBODY, IGG: Cyclic Citrullin Peptide Ab: <16 UNITS

## 2020-03-27 LAB — VITAMIN B12: Vitamin B-12: 694 pg/mL (ref 200–1100)

## 2020-03-27 LAB — VITAMIN D 25 HYDROXY (VIT D DEFICIENCY, FRACTURES): Vit D, 25-Hydroxy: 26 ng/mL — ABNORMAL LOW (ref 30–100)

## 2020-03-27 LAB — RHEUMATOID FACTOR: Rheumatoid fact SerPl-aCnc: <14 IU/mL (ref <14)

## 2020-03-28 ENCOUNTER — Telehealth: Payer: Self-pay | Admitting: Family Medicine

## 2020-03-28 ENCOUNTER — Telehealth: Payer: Self-pay | Admitting: Adult Health

## 2020-03-28 ENCOUNTER — Encounter: Payer: Self-pay | Admitting: Family Medicine

## 2020-03-28 NOTE — Telephone Encounter (Signed)
Pt stated her Ortho doctor Dr. Junius Roads Vit D level is down to 26 and recommends he takes 5000 IU daily. She is already taking calcium with vit D on it. She would like to know her PCP view on if she should continue what she is taking or also take the Vit D 5000 IU daily  Pt can be reached at [phone]

## 2020-03-28 NOTE — Telephone Encounter (Signed)
Labs look good except for vitamin D, which is low at 26 (goal level is 50-80).  This can be a source of musculoskeletal pain.  I recommend taking vitamin D3 at 5,000 IU daily and we'll recheck in 4-6 months.

## 2020-03-28 NOTE — Telephone Encounter (Signed)
She will be ok taking bother her vitamin D and Calcium supplement and Vitamin D 5000 untis

## 2020-03-29 ENCOUNTER — Encounter: Payer: Self-pay | Admitting: Adult Health

## 2020-05-01 ENCOUNTER — Other Ambulatory Visit: Payer: Self-pay | Admitting: Adult Health

## 2020-05-01 DIAGNOSIS — Z760 Encounter for issue of repeat prescription: Secondary | ICD-10-CM

## 2020-05-01 DIAGNOSIS — E039 Hypothyroidism, unspecified: Secondary | ICD-10-CM

## 2020-05-22 ENCOUNTER — Other Ambulatory Visit: Payer: Self-pay

## 2020-05-22 ENCOUNTER — Encounter: Payer: Self-pay | Admitting: Adult Health

## 2020-05-22 ENCOUNTER — Ambulatory Visit (INDEPENDENT_AMBULATORY_CARE_PROVIDER_SITE_OTHER): Payer: PPO | Admitting: Adult Health

## 2020-05-22 VITALS — BP 132/80 | HR 77 | Temp 98.3°F | Ht 61.5 in | Wt 154.0 lb

## 2020-05-22 DIAGNOSIS — Z23 Encounter for immunization: Secondary | ICD-10-CM | POA: Diagnosis not present

## 2020-05-22 DIAGNOSIS — E039 Hypothyroidism, unspecified: Secondary | ICD-10-CM | POA: Diagnosis not present

## 2020-05-22 DIAGNOSIS — E782 Mixed hyperlipidemia: Secondary | ICD-10-CM

## 2020-05-22 DIAGNOSIS — Z0000 Encounter for general adult medical examination without abnormal findings: Secondary | ICD-10-CM | POA: Diagnosis not present

## 2020-05-22 DIAGNOSIS — G43811 Other migraine, intractable, with status migrainosus: Secondary | ICD-10-CM

## 2020-05-22 DIAGNOSIS — E559 Vitamin D deficiency, unspecified: Secondary | ICD-10-CM | POA: Diagnosis not present

## 2020-05-22 DIAGNOSIS — K2100 Gastro-esophageal reflux disease with esophagitis, without bleeding: Secondary | ICD-10-CM

## 2020-05-22 DIAGNOSIS — I10 Essential (primary) hypertension: Secondary | ICD-10-CM

## 2020-05-22 NOTE — Addendum Note (Signed)
Addended by: Agnes Lawrence on: 05/22/2020 09:20 AM   Modules accepted: Orders

## 2020-05-22 NOTE — Patient Instructions (Signed)
It was great seeing you today   We will follow up with you regarding your blood work   Please follow up with Dr. Junius Roads about the back and hand pain   If you decide you want to try emgality, please let me know

## 2020-05-22 NOTE — Progress Notes (Signed)
Subjective:    Patient ID: Patricia Solis, female    DOB: [birth year], 69 y.o.   MRN: [id number]  HPI Patient presents for yearly preventative medicine examination. She is a pleasant 69 year old female who  has a past medical history of Basal cell carcinoma (2007), Breast cancer ([place]), Carotid bruit (08/2008), DUB (dysfunctional uterine bleeding), Esophagitis, HTN (hypertension), Hyperlipidemia, Hypothyroidism, MHA (microangiopathic hemolytic anemia) ([place]), Personal history of chemotherapy, Personal history of radiation therapy, and Spinal stenosis.  Hypertension-is prescribed HCTZ 12.5 mg and Cozaar 25 mg daily.  She denies dizziness, lightheadedness, chest pain, or shortness of breath.  She does monitor her blood pressures at home and continuously gets readings between 120 and 130 over 70s to 80s. BP Readings from Last 3 Encounters:  10/23/19 130/72  07/09/19 125/67  05/11/19 130/64   Hypothyroidism-currently maintained on Synthroid 88 mcg daily  Lab Results  Component Value Date   TSH 1.23 10/04/2019   Hyperlipidemia-takes simvastatin 40 mg daily.  She denies myalgia or fatigue Lab Results  Component Value Date   CHOL 226 (H) 05/11/2019   HDL 68.70 05/11/2019   LDLCALC 141 (H) 05/11/2019   LDLDIRECT 140.4 09/02/2006   TRIG 81.0 05/11/2019   CHOLHDL 3 05/11/2019    GERD-controlled with Prilosec  Vitamin D Deficiency - was found to be slightly Vitamin D deficient in September and was started on Vitamin D 5000 units  Migraine headaches - continues to have migraine headaches.  Has been seen by neurology in the past but did not have good results with medications that she is tried.  Will use BC powder at home which seems to work better than anything neurology prescribed her in the past.  Some migraines will last for a week, has multiple migraines a month  All immunizations and health maintenance protocols were reviewed with the patient and needed orders were placed. She is due  for influenza vaccinations   Appropriate screening laboratory values were ordered for the patient including screening of hyperlipidemia, renal function and hepatic function.  Medication reconciliation,  past medical history, social history, problem list and allergies were reviewed in detail with the patient  Goals were established with regard to weight loss, exercise, and  diet in compliance with medications.  She does go to the [facility] about 4 times a week and eats a heart healthy diet Wt Readings from Last 3 Encounters:  05/22/20 154 lb (69.9 kg)  10/23/19 151 lb (68.5 kg)  07/09/19 148 lb (67.1 kg)     She is up-to-date on routine colon cancer screening and mammograms. She is seen By Dermatology on a yearly basis.    Review of Systems  Constitutional: Negative.   HENT: Negative.   Eyes: Negative.   Respiratory: Negative.   Cardiovascular: Negative.   Gastrointestinal: Negative.   Endocrine: Negative.   Genitourinary: Negative.   Musculoskeletal: Positive for arthralgias and back pain.  Skin: Negative.   Allergic/Immunologic: Negative.   Neurological: Positive for headaches.  Hematological: Negative.   Psychiatric/Behavioral: Negative.    Past Medical History:  Diagnosis Date   Basal cell carcinoma 2007   [place]   Breast cancer ([place])    Invasive lobular carcinoma RIGHT breast   Carotid bruit 08/2008   Normal Doppler   DUB (dysfunctional uterine bleeding)    Esophagitis    HTN (hypertension)    Tx started 06/2010   Hyperlipidemia    Hypothyroidism    MHA (microangiopathic hemolytic anemia) ([facility])    Personal history of chemotherapy  2003   Personal history of radiation therapy    2003 right breast   Spinal stenosis     Social History   Socioeconomic History   Marital status: Married    Spouse name: Not on file   Number of children: Not on file   Years of education: Not on file   Highest education level: Not on file  Occupational History    Not on file  Tobacco Use   Smoking status: Never Smoker   Smokeless tobacco: Never Used  Substance and Sexual Activity   Alcohol use: No   Drug use: No   Sexual activity: Not on file  Other Topics Concern   Not on file  Social History Narrative   Retired in March    Married for 61 years    One daughter, lives in [place]    Has four grandchildren      Likes to play with grand kids, go to [employer] and mountains.       Social Determinants of Health   Financial Resource Strain:    Difficulty of Paying Living Expenses: Not on file  Food Insecurity:    Worried About Charity fundraiser in the Last Year: Not on file   [employer] of Food in the Last Year: Not on file  Transportation Needs:    Lack of Transportation (Medical): Not on file   Lack of Transportation (Non-Medical): Not on file  Physical Activity:    Days of Exercise per Week: Not on file   Minutes of Exercise per Session: Not on file  Stress:    Feeling of Stress : Not on file  Social Connections:    Frequency of Communication with Friends and Family: Not on file   Frequency of Social Gatherings with Friends and Family: Not on file   Attends Religious Services: Not on file   Active Member of Clubs or Organizations: Not on file   Attends Archivist Meetings: Not on file   Marital Status: Not on file  Intimate Partner Violence:    Fear of Current or Ex-Partner: Not on file   Emotionally Abused: Not on file   Physically Abused: Not on file   Sexually Abused: Not on file    Past Surgical History:  Procedure Laterality Date   BREAST BIOPSY Right    2007 benign   BREAST LUMPECTOMY Right    2003   BTL     CATARACT EXTRACTION, BILATERAL Bilateral 03/26/2016   still blurred vision; Md states this is fine   CERVICAL LAMINECTOMY     Left   FOOT SURGERY     SHOULDER ARTHROSCOPY Right    TUBAL LIGATION  1981    Family History  Problem Relation Age of Onset   Heart attack  Father    Coronary artery disease Father    Diabetes Father    Coronary artery disease Mother    Leukemia Mother    Thyroid disease Mother    Multiple sclerosis Sister        Twin   Breast cancer Maternal Aunt     Allergies  Allergen Reactions   Ambien Cr [Zolpidem] Other (See Comments)    Hallucinations   Cozaar [Losartan] Cough   Erythromycin     REACTION: rash   Hydrocodone Other (See Comments)    Hallucinations   Lisinopril Cough   Penicillins     REACTION: rash   Clindamycin/Lincomycin Rash    Current Outpatient Medications on File Prior to  Visit  Medication Sig Dispense Refill   aspirin 81 MG EC tablet Take 81 mg by mouth daily.       B Complex-C (B-COMPLEX WITH VITAMIN C) tablet Take 1 tablet by mouth daily.     calcium carbonate (OS-CAL) 600 MG TABS tablet Take 1 tablet (600 mg total) by mouth 2 (two) times daily with a meal. 180 tablet 3   GLUCOSAMINE PO Take 1 tablet by mouth 2 (two) times daily.     hydrochlorothiazide (HYDRODIURIL) 25 MG tablet Take 1 tablet (25 mg total) by mouth daily. 90 tablet 3   hydrocortisone 2.5 % ointment Apply topically 2 (two) times daily.     levothyroxine (EUTHYROX) 75 MCG tablet Take 1 tablet (75 mcg total) by mouth daily before breakfast. 90 tablet 0   omeprazole (PRILOSEC) 20 MG capsule Take 1 capsule by mouth once daily 90 capsule 0   simvastatin (ZOCOR) 40 MG tablet Take 1 tablet by mouth every night at bedtime 90 tablet 3   No current facility-administered medications on file prior to visit.    There were no vitals taken for this visit.      Objective:   Physical Exam Vitals and nursing note reviewed.  Constitutional:      General: She is not in acute distress.    Appearance: Normal appearance. She is well-developed. She is not ill-appearing.  HENT:     Head: Normocephalic and atraumatic.     Right Ear: Tympanic membrane, ear canal and external ear normal. There is no impacted cerumen.      Left Ear: Tympanic membrane, ear canal and external ear normal. There is no impacted cerumen.     Nose: Nose normal. No congestion or rhinorrhea.     Mouth/Throat:     Mouth: Mucous membranes are moist.     Pharynx: Oropharynx is clear. No oropharyngeal exudate or posterior oropharyngeal erythema.  Eyes:     General:        Right eye: No discharge.        Left eye: No discharge.     Extraocular Movements: Extraocular movements intact.     Conjunctiva/sclera: Conjunctivae normal.     Pupils: Pupils are equal, round, and reactive to light.  Neck:     Thyroid: No thyromegaly.     Vascular: No carotid bruit.     Trachea: No tracheal deviation.  Cardiovascular:     Rate and Rhythm: Normal rate and regular rhythm.     Pulses: Normal pulses.     Heart sounds: Normal heart sounds. No murmur heard.  No friction rub. No gallop.   Pulmonary:     Effort: Pulmonary effort is normal. No respiratory distress.     Breath sounds: Normal breath sounds. No stridor. No wheezing, rhonchi or rales.  Chest:     Chest wall: No tenderness.  Abdominal:     General: Abdomen is flat. Bowel sounds are normal. There is no distension.     Palpations: Abdomen is soft. There is no mass.     Tenderness: There is no abdominal tenderness. There is no right CVA tenderness, left CVA tenderness, guarding or rebound.     Hernia: No hernia is present.  Musculoskeletal:        General: No swelling, tenderness, deformity or signs of injury. Normal range of motion.     Cervical back: Normal range of motion and neck supple.     Right lower leg: No edema.     Left lower leg:  No edema.  Lymphadenopathy:     Cervical: No cervical adenopathy.  Skin:    General: Skin is warm and dry.     Coloration: Skin is not jaundiced or pale.     Findings: No bruising, erythema, lesion or rash.  Neurological:     General: No focal deficit present.     Mental Status: She is alert and oriented to person, place, and time.     Cranial  Nerves: No cranial nerve deficit.     Sensory: No sensory deficit.     Motor: No weakness.     Coordination: Coordination normal.     Gait: Gait normal.     Deep Tendon Reflexes: Reflexes normal.  Psychiatric:        Mood and Affect: Mood normal.        Behavior: Behavior normal.        Thought Content: Thought content normal.        Judgment: Judgment normal.       Assessment & Plan:  1. Routine general medical examination at a health care facility -Continue exercise regimen and her healthy diet.  -Follow-up in 1 year or sooner if needed - CBC with Differential/Platelet; Future - CMP with eGFR([facility]); Future - Hemoglobin A1c; Future - Lipid panel; Future - TSH; Future - TSH - Lipid panel - Hemoglobin A1c - CMP with eGFR([facility]) - CBC with Differential/Platelet  2. Essential hypertension - No change in medication at this time  - CBC with Differential/Platelet; Future - CMP with eGFR([facility]); Future - Hemoglobin A1c; Future - Lipid panel; Future - TSH; Future - TSH - Lipid panel - Hemoglobin A1c - CMP with eGFR([facility]) - CBC with Differential/Platelet  3. Hypothyroidism, unspecified type - Consider dose change of synthroid  - CBC with Differential/Platelet; Future - CMP with eGFR([facility]); Future - Hemoglobin A1c; Future - Lipid panel; Future - TSH; Future - TSH - Lipid panel - Hemoglobin A1c - CMP with eGFR([facility]) - CBC with Differential/Platelet  4. Gastroesophageal reflux disease with esophagitis without hemorrhage - Continue PPI   5. Mixed hyperlipidemia - Consider increase in statin  - CBC with Differential/Platelet; Future - CMP with eGFR([facility]); Future - Hemoglobin A1c; Future - Lipid panel; Future - TSH; Future - TSH - Lipid panel - Hemoglobin A1c - CMP with eGFR([facility]) - CBC with Differential/Platelet  6. Vitamin D deficiency  - VITAMIN D 25 Hydroxy (Vit-D Deficiency, Fractures); Future - VITAMIN D 25 Hydroxy (Vit-D Deficiency,  Fractures)  7. Other migraine with status migrainosus, intractable -Discussed Emgality with her.  She will think about it and let me know  Dorothyann Peng, NP

## 2020-05-23 ENCOUNTER — Ambulatory Visit (INDEPENDENT_AMBULATORY_CARE_PROVIDER_SITE_OTHER): Payer: PPO

## 2020-05-23 ENCOUNTER — Ambulatory Visit: Payer: PPO

## 2020-05-23 VITALS — Wt 154.0 lb

## 2020-05-23 DIAGNOSIS — Z0000 Encounter for general adult medical examination without abnormal findings: Secondary | ICD-10-CM

## 2020-05-23 LAB — COMPLETE METABOLIC PANEL WITH GFR
AG Ratio: 1.7 (calc) (ref 1.0–2.5)
ALT: 15 U/L (ref 6–29)
AST: 15 U/L (ref 10–35)
Albumin: 4.4 g/dL (ref 3.6–5.1)
Alkaline phosphatase (APISO): 68 U/L (ref 37–153)
BUN: 21 mg/dL (ref 7–25)
CO2: 28 mmol/L (ref 20–32)
Calcium: 9.7 mg/dL (ref 8.6–10.4)
Chloride: 99 mmol/L (ref 98–110)
Creat: 0.64 mg/dL (ref 0.50–0.99)
GFR, Est African American: 106 mL/min/{1.73_m2} (ref >=60)
GFR, Est Non African American: 91 mL/min/{1.73_m2} (ref >=60)
Globulin: 2.6 g/dL (calc) (ref 1.9–3.7)
Glucose, Bld: 89 mg/dL (ref 65–99)
Potassium: 4.4 mmol/L (ref 3.5–5.3)
Sodium: 140 mmol/L (ref 135–146)
Total Bilirubin: 0.5 mg/dL (ref 0.2–1.2)
Total Protein: 7 g/dL (ref 6.1–8.1)

## 2020-05-23 LAB — CBC WITH DIFFERENTIAL/PLATELET
Absolute Monocytes: 400 cells/uL (ref 200–950)
Basophils Absolute: 52 cells/uL (ref 0–200)
Basophils Relative: 0.9 %
Eosinophils Absolute: 249 cells/uL (ref 15–500)
Eosinophils Relative: 4.3 %
HCT: 43 % (ref 35.0–45.0)
Hemoglobin: 14.3 g/dL (ref 11.7–15.5)
Lymphs Abs: 1862 cells/uL (ref 850–3900)
MCH: 31.1 pg (ref 27.0–33.0)
MCHC: 33.3 g/dL (ref 32.0–36.0)
MCV: 93.5 fL (ref 80.0–100.0)
MPV: 9.3 fL (ref 7.5–12.5)
Monocytes Relative: 6.9 %
Neutro Abs: 3236 cells/uL (ref 1500–7800)
Neutrophils Relative %: 55.8 %
Platelets: 304 10*3/uL (ref 140–400)
RBC: 4.6 10*6/uL (ref 3.80–5.10)
RDW: 13.1 % (ref 11.0–15.0)
Total Lymphocyte: 32.1 %
WBC: 5.8 10*3/uL (ref 3.8–10.8)

## 2020-05-23 LAB — LIPID PANEL
Cholesterol: 225 mg/dL — ABNORMAL HIGH (ref <200)
HDL: 74 mg/dL (ref >=50)
LDL Cholesterol (Calc): 131 mg/dL (calc) — ABNORMAL HIGH
Non-HDL Cholesterol (Calc): 151 mg/dL (calc) — ABNORMAL HIGH (ref <130)
Total CHOL/HDL Ratio: 3 (calc) (ref <5.0)
Triglycerides: 95 mg/dL (ref <150)

## 2020-05-23 LAB — TSH: TSH: 2.92 mIU/L (ref 0.40–4.50)

## 2020-05-23 LAB — HEMOGLOBIN A1C
Hgb A1c MFr Bld: 5.4 % of total Hgb (ref <5.7)
Mean Plasma Glucose: 108 (calc)
eAG (mmol/L): 6 (calc)

## 2020-05-23 LAB — VITAMIN D 25 HYDROXY (VIT D DEFICIENCY, FRACTURES): Vit D, 25-Hydroxy: 47 ng/mL (ref 30–100)

## 2020-05-23 NOTE — Progress Notes (Signed)
Subjective:   Patricia Solis is a 69 y.o. female who presents for Medicare Annual (Subsequent) preventive examination.  I connected with [name] today by telephone and verified that I am speaking with the correct person using two identifiers. Location patient: home Location provider: work Persons participating in the virtual visit: patient, provider.   I discussed the limitations, risks, security and privacy concerns of performing an evaluation and management service by telephone and the availability of in person appointments. I also discussed with the patient that there may be a patient responsible charge related to this service. The patient expressed understanding and verbally consented to this telephonic visit.    Interactive audio and video telecommunications were attempted between this provider and patient, however failed, due to patient having technical difficulties OR patient did not have access to video capability.  We continued and completed visit with audio only.      Review of Systems    N/A  Cardiac Risk Factors include: advanced age (>44men, >62 women);hypertension     Objective:    Today's Vitals   05/23/20 0947  Weight: 154 lb (69.9 kg)   Body mass index is 28.63 kg/m.  Advanced Directives 05/23/2020 11/13/2019 07/09/2019 04/28/2017  Does Patient Have a Medical Advance Directive? Yes Yes No Yes  Type of Paramedic of [place];Living will [place];Living will - -  Does patient want to make changes to medical advance directive? No - Patient declined No - Guardian declined - -  Copy of [place] in Chart? No - copy requested - - -    Current Medications (verified) Outpatient Encounter Medications as of 05/23/2020  Medication Sig  . aspirin 81 MG EC tablet Take 81 mg by mouth daily.    . B Complex-C (B-COMPLEX WITH VITAMIN C) tablet Take 1 tablet by mouth daily.  . calcium carbonate (OS-CAL)  600 MG TABS tablet Take 1 tablet (600 mg total) by mouth 2 (two) times daily with a meal.  . Cholecalciferol (VITAMIN D3 PO) Take by mouth.  . co-enzyme Q-10 30 MG capsule Take 30 mg by mouth 3 (three) times daily.  Marland Kitchen CRANBERRY PO Take by mouth.  Marland Kitchen GLUCOSAMINE PO Take 1 tablet by mouth 2 (two) times daily.  . hydrochlorothiazide (HYDRODIURIL) 25 MG tablet Take 1 tablet (25 mg total) by mouth daily.  . hydrocortisone 2.5 % ointment Apply topically 2 (two) times daily.  Marland Kitchen levothyroxine (EUTHYROX) 75 MCG tablet Take 1 tablet (75 mcg total) by mouth daily before breakfast.  . magnesium oxide (MAG-OX) 400 MG tablet Take 400 mg by mouth daily.  Marland Kitchen omeprazole (PRILOSEC) 20 MG capsule Take 1 capsule by mouth once daily  . simvastatin (ZOCOR) 40 MG tablet Take 1 tablet by mouth every night at bedtime   No facility-administered encounter medications on file as of 05/23/2020.    Allergies (verified) Ambien cr [zolpidem], Cozaar [losartan], Erythromycin, Hydrocodone, Lisinopril, Penicillins, and Clindamycin/lincomycin   History: Past Medical History:  Diagnosis Date  . Basal cell carcinoma 2007   [facility]  . Breast cancer ([place])    Invasive lobular carcinoma RIGHT breast  . Carotid bruit 08/2008   Normal Doppler  . DUB (dysfunctional uterine bleeding)   . Esophagitis   . HTN (hypertension)    Tx started 06/2010  . Hyperlipidemia   . Hypothyroidism   . MHA (microangiopathic hemolytic anemia) ([facility])   . Personal history of chemotherapy    2003  . Personal history of  radiation therapy    2003 right breast  . Spinal stenosis    Past Surgical History:  Procedure Laterality Date  . BREAST BIOPSY Right    2007 benign  . BREAST LUMPECTOMY Right    2003  . BTL    . CATARACT EXTRACTION, BILATERAL Bilateral 03/26/2016   still blurred vision; Md states this is fine  . CERVICAL LAMINECTOMY     Left  . FOOT SURGERY    . SHOULDER ARTHROSCOPY Right   . TUBAL LIGATION  1981   Family History   Problem Relation Age of Onset  . Heart attack Father   . Coronary artery disease Father   . Diabetes Father   . Coronary artery disease Mother   . Leukemia Mother   . Thyroid disease Mother   . Multiple sclerosis Sister        Twin  . Breast cancer Maternal Aunt    Social History   Socioeconomic History  . Marital status: Married    Spouse name: Not on file  . Number of children: Not on file  . Years of education: Not on file  . Highest education level: Not on file  Occupational History  . Not on file  Tobacco Use  . Smoking status: Never Smoker  . Smokeless tobacco: Never Used  Substance and Sexual Activity  . Alcohol use: No  . Drug use: No  . Sexual activity: Not on file  Other Topics Concern  . Not on file  Social History Narrative   Retired in March    Married for 74 years    One daughter, lives in [place]    Has four grandchildren      Likes to play with grand kids, go to [employer] and mountains.       Social Determinants of Health   Financial Resource Strain: Low Risk   . Difficulty of Paying Living Expenses: Not hard at all  Food Insecurity: No Food Insecurity  . Worried About Charity fundraiser in the Last Year: Never true  . Ran Out of Food in the Last Year: Never true  Transportation Needs: No Transportation Needs  . Lack of Transportation (Medical): No  . Lack of Transportation (Non-Medical): No  Physical Activity: Sufficiently Active  . Days of Exercise per Week: 4 days  . Minutes of Exercise per Session: 40 min  Stress: No Stress Concern Present  . Feeling of Stress : Not at all  Social Connections: Moderately Integrated  . Frequency of Communication with Friends and Family: Once a week  . Frequency of Social Gatherings with Friends and Family: More than three times a week  . Attends Religious Services: More than 4 times per year  . Active Member of Clubs or Organizations: No  . Attends Archivist Meetings: Never  . Marital Status:  Married    Tobacco Counseling Counseling given: Not Answered   Clinical Intake:  Pre-visit preparation completed: Yes  Pain : No/denies pain     Nutritional Risks: None Diabetes: No  How often do you need to have someone help you when you read instructions, pamphlets, or other written materials from your doctor or pharmacy?: 1 - Never What is the last grade level you completed in school?: 12th grade  Diabetic?No  Interpreter Needed?: No  Information entered by :: [place] of Daily Living In your present state of health, do you have any difficulty performing the following activities: 05/23/2020  Hearing? N  Vision?  Y  Comment Has blurry vision on occassion due to dry eyes  Difficulty concentrating or making decisions? Y  Walking or climbing stairs? Y  Comment Has some knee pain with knees and left leg pain  Dressing or bathing? N  Doing errands, shopping? N  Preparing Food and eating ? N  Using the Toilet? N  In the past six months, have you accidently leaked urine? N  Do you have problems with loss of bowel control? N  Managing your Medications? N  Managing your Finances? N  Housekeeping or managing your Housekeeping? N  Some recent data might be hidden    Patient Care Team: Dorothyann Peng, NP as PCP - General (Family Medicine)  Indicate any recent Medical Services you may have received from other than [facility] providers in the past year (date may be approximate).     Assessment:   This is a routine wellness examination for [place].  Hearing/Vision screen  Hearing Screening   125Hz  250Hz  500Hz  1000Hz  2000Hz  3000Hz  4000Hz  6000Hz  8000Hz   Right ear:           Left ear:           Vision Screening Comments: Patient states gets eyes examined once per year   Dietary issues and exercise activities discussed: Current Exercise Habits: Home exercise routine, Type of exercise: walking;treadmill;strength training/weights, Time (Minutes): 40, Frequency  (Times/Week): 4, Weekly Exercise (Minutes/Week): 160, Intensity: Mild  Goals    . Exercise 150 minutes per week (moderate activity)     Will start going to the gym by yourself in Jan  Gets up early!      Depression Screen PHQ 2/9 Scores 05/23/2020 05/22/2020 05/02/2018 04/28/2017 04/28/2017 06/28/2016  PHQ - 2 Score 0 0 0 0 0 0  PHQ- 9 Score 0 - - - - -    Fall Risk Fall Risk  05/23/2020 05/22/2020 06/19/2019 05/02/2018 04/28/2017  Falls in the past year? 0 0 1 No No  Comment - - Emmi Telephone Survey: data to providers prior to load - -  Number falls in past yr: 0 0 1 - -  Comment - - Emmi Telephone Survey Actual Response = 1 - -  Injury with Fall? 0 0 1 - -  Risk for fall due to : No Fall Risks - - - -  Follow up Falls evaluation completed;Falls prevention discussed - - - -    Any stairs in or around the home? No  If so, are there any without handrails? No  Home free of loose throw rugs in walkways, pet beds, electrical cords, etc? Yes  Adequate lighting in your home to reduce risk of falls? Yes   ASSISTIVE DEVICES UTILIZED TO PREVENT FALLS:  Life alert? No  Use of a cane, walker or w/c? No  Grab bars in the bathroom? Yes  Shower chair or bench in shower? No  Elevated toilet seat or a handicapped toilet? Yes    Cognitive Function: MMSE - Mini Mental State Exam 04/28/2017  Not completed: (No Data)     6CIT Screen 05/23/2020  What Year? 0 points  What month? 0 points  What time? 0 points  Count back from 20 0 points  Months in reverse 0 points  Repeat phrase 0 points  Total Score 0    Immunizations Immunization History  Administered Date(s) Administered  . Fluad Quad(high Dose 65+) 05/11/2019, 05/22/2020  . Influenza Whole 07/26/2005, 04/25/2009  . Influenza, High Dose Seasonal PF 04/27/2016, 04/28/2017, 05/02/2018  . Influenza,inj,quad,  With Preservative 04/25/2017  . PFIZER SARS-COV-2 Vaccination 09/01/2019, 09/26/2019, 04/21/2020  . Pneumococcal Conjugate-13  04/27/2016  . Pneumococcal Polysaccharide-23 04/28/2017  . Td 07/26/2001  . Tdap 09/21/2011  . Zoster 07/11/2012  . Zoster Recombinat (Shingrix) 04/20/2017, 08/11/2017, 09/08/2017    TDAP status: Up to date Flu Vaccine status: Up to date Pneumococcal vaccine status: Up to date Covid-19 vaccine status: Completed vaccines  Qualifies for Shingles Vaccine? Yes   Zostavax completed No   Shingrix Completed?: Yes  Screening Tests Health Maintenance  Topic Date Due  . TETANUS/TDAP  09/20/2021  . MAMMOGRAM  01/28/2022  . COLONOSCOPY  08/26/2022  . INFLUENZA VACCINE  Completed  . DEXA SCAN  Completed  . COVID-19 Vaccine  Completed  . Hepatitis C Screening  Completed  . PNA vac Low Risk Adult  Completed  . FOOT EXAM  Discontinued  . HEMOGLOBIN A1C  Discontinued  . OPHTHALMOLOGY EXAM  Discontinued  . URINE MICROALBUMIN  Discontinued    Health Maintenance  There are no preventive care reminders to display for this patient.  Colorectal cancer screening: Completed 08/26/2012. Repeat every 10 years Mammogram status: Completed 01/29/2020. Repeat every year Bone Density status: Completed 10/26/2017. Results reflect: Bone density results: OSTEOPENIA. Repeat every 5 years.  Lung Cancer Screening: (Low Dose CT Chest recommended if Age 34-80 years, 30 pack-year currently smoking OR have quit w/in 15years.) does not qualify.   Lung Cancer Screening Referral: N/A   Additional Screening:  Hepatitis C Screening: does qualify; Completed 04/27/2016  Vision Screening: Recommended annual ophthalmology exams for early detection of glaucoma and other disorders of the eye. Is the patient up to date with their annual eye exam?  Yes  Who is the provider or what is the name of the office in which the patient attends annual eye exams? Dr. Gershon Crane  If pt is not established with a provider, would they like to be referred to a provider to establish care? No .   Dental Screening: Recommended annual  dental exams for proper oral hygiene  Community Resource Referral / Chronic Care Management: CRR required this visit?  No   CCM required this visit?  No      Plan:     I have personally reviewed and noted the following in the patient's chart:   . Medical and social history . Use of alcohol, tobacco or illicit drugs  . Current medications and supplements . Functional ability and status . Nutritional status . Physical activity . Advanced directives . List of other physicians . Hospitalizations, surgeries, and ER visits in previous 12 months . Vitals . Screenings to include cognitive, depression, and falls . Referrals and appointments  In addition, I have reviewed and discussed with patient certain preventive protocols, quality metrics, and best practice recommendations. A written personalized care plan for preventive services as well as general preventive health recommendations were provided to patient.     Ofilia Neas, LPN   [id number]   Nurse Notes: None

## 2020-05-23 NOTE — Patient Instructions (Signed)
Patricia Solis , Thank you for taking time to come for your Medicare Wellness Visit. I appreciate your ongoing commitment to your health goals. Please review the following plan we discussed and let me know if I can assist you in the future.   Screening recommendations/referrals: Colonoscopy: Up to date, next due 08/26/2022 Mammogram: Up to date, next due 01/28/2021 Bone Density: Up to date, next due 10/27/2022 Recommended yearly ophthalmology/optometry visit for glaucoma screening and checkup Recommended yearly dental visit for hygiene and checkup  Vaccinations: Influenza vaccine: Up to date, next due 05/22/2021 Pneumococcal vaccine: Completed series  Tdap vaccine: Up to date, next due 09/20/2021 Shingles vaccine: Completes series    Advanced directives: Please bring copies of your advanced medical directives into the office so that we may scan them into your chart.   Conditions/risks identified: None   Next appointment: None    Preventive Care 65 Years and Older, Female Preventive care refers to lifestyle choices and visits with your health care provider that can promote health and wellness. What does preventive care include?  A yearly physical exam. This is also called an annual well check.  Dental exams once or twice a year.  Routine eye exams. Ask your health care provider how often you should have your eyes checked.  Personal lifestyle choices, including:  Daily care of your teeth and gums.  Regular physical activity.  Eating a healthy diet.  Avoiding tobacco and drug use.  Limiting alcohol use.  Practicing safe sex.  Taking low-dose aspirin every day.  Taking vitamin and mineral supplements as recommended by your health care provider. What happens during an annual well check? The services and screenings done by your health care provider during your annual well check will depend on your age, overall health, lifestyle risk factors, and family history of  disease. Counseling  Your health care provider may ask you questions about your:  Alcohol use.  Tobacco use.  Drug use.  Emotional well-being.  Home and relationship well-being.  Sexual activity.  Eating habits.  History of falls.  Memory and ability to understand (cognition).  Work and work Statistician.  Reproductive health. Screening  You may have the following tests or measurements:  Height, weight, and BMI.  Blood pressure.  Lipid and cholesterol levels. These may be checked every 5 years, or more frequently if you are over 19 years old.  Skin check.  Lung cancer screening. You may have this screening every year starting at age 25 if you have a 30-pack-year history of smoking and currently smoke or have quit within the past 15 years.  Fecal occult blood test (FOBT) of the stool. You may have this test every year starting at age 44.  Flexible sigmoidoscopy or colonoscopy. You may have a sigmoidoscopy every 5 years or a colonoscopy every 10 years starting at age 74.  Hepatitis C blood test.  Hepatitis B blood test.  Sexually transmitted disease (STD) testing.  Diabetes screening. This is done by checking your blood sugar (glucose) after you have not eaten for a while (fasting). You may have this done every 1-3 years.  Bone density scan. This is done to screen for osteoporosis. You may have this done starting at age 73.  Mammogram. This may be done every 1-2 years. Talk to your health care provider about how often you should have regular mammograms. Talk with your health care provider about your test results, treatment options, and if necessary, the need for more tests. Vaccines  Your health care  provider may recommend certain vaccines, such as:  Influenza vaccine. This is recommended every year.  Tetanus, diphtheria, and acellular pertussis (Tdap, Td) vaccine. You may need a Td booster every 10 years.  Zoster vaccine. You may need this after age  8.  Pneumococcal 13-valent conjugate (PCV13) vaccine. One dose is recommended after age 63.  Pneumococcal polysaccharide (PPSV23) vaccine. One dose is recommended after age 31. Talk to your health care provider about which screenings and vaccines you need and how often you need them. This information is not intended to replace advice given to you by your health care provider. Make sure you discuss any questions you have with your health care provider. Document Released: 08/08/2015 Document Revised: 03/31/2016 Document Reviewed: 05/13/2015 Elsevier Interactive Patient Education  2017 [employer] Prevention in the Home Falls can cause injuries. They can happen to people of all ages. There are many things you can do to make your home safe and to help prevent falls. What can I do on the outside of my home?  Regularly fix the edges of walkways and driveways and fix any cracks.  Remove anything that might make you trip as you walk through a door, such as a raised step or threshold.  Trim any bushes or trees on the path to your home.  Use bright outdoor lighting.  Clear any walking paths of anything that might make someone trip, such as rocks or tools.  Regularly check to see if handrails are loose or broken. Make sure that both sides of any steps have handrails.  Any raised decks and porches should have guardrails on the edges.  Have any leaves, snow, or ice cleared regularly.  Use sand or salt on walking paths during winter.  Clean up any spills in your garage right away. This includes oil or grease spills. What can I do in the bathroom?  Use night lights.  Install grab bars by the toilet and in the tub and shower. Do not use towel bars as grab bars.  Use non-skid mats or decals in the tub or shower.  If you need to sit down in the shower, use a plastic, non-slip stool.  Keep the floor dry. Clean up any water that spills on the floor as soon as it happens.  Remove  soap buildup in the tub or shower regularly.  Attach bath mats securely with double-sided non-slip rug tape.  Do not have throw rugs and other things on the floor that can make you trip. What can I do in the bedroom?  Use night lights.  Make sure that you have a light by your bed that is easy to reach.  Do not use any sheets or blankets that are too big for your bed. They should not hang down onto the floor.  Have a firm chair that has side arms. You can use this for support while you get dressed.  Do not have throw rugs and other things on the floor that can make you trip. What can I do in the kitchen?  Clean up any spills right away.  Avoid walking on wet floors.  Keep items that you use a lot in easy-to-reach places.  If you need to reach something above you, use a strong step stool that has a grab bar.  Keep electrical cords out of the way.  Do not use floor polish or wax that makes floors slippery. If you must use wax, use non-skid floor wax.  Do not have throw  rugs and other things on the floor that can make you trip. What can I do with my stairs?  Do not leave any items on the stairs.  Make sure that there are handrails on both sides of the stairs and use them. Fix handrails that are broken or loose. Make sure that handrails are as long as the stairways.  Check any carpeting to make sure that it is firmly attached to the stairs. Fix any carpet that is loose or worn.  Avoid having throw rugs at the top or bottom of the stairs. If you do have throw rugs, attach them to the floor with carpet tape.  Make sure that you have a light switch at the top of the stairs and the bottom of the stairs. If you do not have them, ask someone to add them for you. What else can I do to help prevent falls?  Wear shoes that:  Do not have high heels.  Have rubber bottoms.  Are comfortable and fit you well.  Are closed at the toe. Do not wear sandals.  If you use a  stepladder:  Make sure that it is fully opened. Do not climb a closed stepladder.  Make sure that both sides of the stepladder are locked into place.  Ask someone to hold it for you, if possible.  Clearly mark and make sure that you can see:  Any grab bars or handrails.  First and last steps.  Where the edge of each step is.  Use tools that help you move around (mobility aids) if they are needed. These include:  Canes.  Walkers.  Scooters.  Crutches.  Turn on the lights when you go into a dark area. Replace any light bulbs as soon as they burn out.  Set up your furniture so you have a clear path. Avoid moving your furniture around.  If any of your floors are uneven, fix them.  If there are any pets around you, be aware of where they are.  Review your medicines with your doctor. Some medicines can make you feel dizzy. This can increase your chance of falling. Ask your doctor what other things that you can do to help prevent falls. This information is not intended to replace advice given to you by your health care provider. Make sure you discuss any questions you have with your health care provider. Document Released: 05/08/2009 Document Revised: 12/18/2015 Document Reviewed: 08/16/2014 Elsevier Interactive Patient Education  2017 [employer].

## 2020-06-25 ENCOUNTER — Encounter: Payer: Self-pay | Admitting: Adult Health

## 2020-06-26 NOTE — Telephone Encounter (Signed)
Fyi.  Patient has spoken the triage nurse and the bite is getting better.  She will call back and schedule an appointment as needed.

## 2020-06-30 ENCOUNTER — Ambulatory Visit (INDEPENDENT_AMBULATORY_CARE_PROVIDER_SITE_OTHER): Payer: PPO | Admitting: Family Medicine

## 2020-06-30 ENCOUNTER — Ambulatory Visit (INDEPENDENT_AMBULATORY_CARE_PROVIDER_SITE_OTHER): Payer: PPO

## 2020-06-30 ENCOUNTER — Encounter: Payer: Self-pay | Admitting: Family Medicine

## 2020-06-30 ENCOUNTER — Other Ambulatory Visit: Payer: Self-pay

## 2020-06-30 ENCOUNTER — Telehealth: Payer: Self-pay | Admitting: Adult Health

## 2020-06-30 DIAGNOSIS — M25522 Pain in left elbow: Secondary | ICD-10-CM | POA: Diagnosis not present

## 2020-06-30 DIAGNOSIS — M5442 Lumbago with sciatica, left side: Secondary | ICD-10-CM

## 2020-06-30 DIAGNOSIS — M79605 Pain in left leg: Secondary | ICD-10-CM | POA: Diagnosis not present

## 2020-06-30 DIAGNOSIS — M25521 Pain in right elbow: Secondary | ICD-10-CM | POA: Diagnosis not present

## 2020-06-30 DIAGNOSIS — M25572 Pain in left ankle and joints of left foot: Secondary | ICD-10-CM

## 2020-06-30 NOTE — Progress Notes (Signed)
Office Visit Note   Patient: Patricia Solis           Date of Birth: 1951-01-10           MRN: [id number] Visit Date: 06/30/2020 Requested by: Dorothyann Peng, NP [address],  [place] [id number] PCP: Dorothyann Peng, NP  Subjective: Chief Complaint  Patient presents with  . follow up on multiple joint pains (lt leg, feet, arms,etc)    HPI: 69yo F presenting to clinic with concerns of ongoing leg and arm pain. Patient states that she was working in the yard recently, and started getting stabbing pains in both her elbows, and radiating throughout her forearm as well as higher into her upper arms. She tried taking advil to no relief. This morning, she says she was barely able to lift her coffee cup without pain.  She continues to have her lower leg pain and numbness. EMG, lab studies have thus far been largely normal save for low vitamin D. Has been on repletion doses of Vit D, with minimal change. States pain remains primarily in the lateral aspect of her left leg and the top of her left foot. Worse with walking, and states that she will feel a 'stabbing' sensation throughout the top of her foot that forces her to stop walking.               ROS:   All other systems were reviewed and are negative.  Objective: Vital Signs: There were no vitals taken for this visit.  Physical Exam:  General:  Alert and oriented, in no acute distress. Pulm:  Breathing unlabored. Psy:  Normal mood, congruent affect. Skin:  No bruising or rashes appreciated.   MSK: Bilateral Arms with no obvious swelling or deformity.  Tenderness to palpation over bilateral medial epicondyle, which she states is similar to her described pain. No pain over lateral epicondyle. No pain with resisted wrist extension or middle finger extension. Endorses some discomfort with grip bilaterally.  Negative sperling.  Negative Tinel's at Cubital and Carpal tunnel Strength testing:  5 out of 5 strength with  shoulder abduction (C5), wrist extension (C6), wrist flexion (C7), grip strength (C8), and finger abduction (T1).  Sensation: Intact to light touch throughout bilateral upper extremities.   Brisk distal capillary refill.  Left Lower Leg:  Tenderness to palpation of Left Fibular head, though negative Tinel's.  Tenderness to palpation throughout left midfoot, worse in area of navicular.  Tenderness with palpation along distal tibia.   5/5 strength with ankle plantar/dorsiflexion as well as inversion/eversion. Sensation intact.    Imaging: Left Foot XR:  Mild degenerative changes, most appreciated around navicular and medial cuneiform. No acute bony changes. Kellogg Joint without widening.   Assessment & Plan: 69yo F presenting to clinic with concerns of ongoing left lower leg/foot pain, as well as bilateral medial elbow pain.   Elbow Pain:  Bilateral medial elbow pain consistent with medial epicondylitis, likely exacerbated by recent yard work.  - Will place referral to PT  - Elbow straps provided - Relative rest  Left Leg Pain:  History of Spinal stenosis diagnosed by Lumbar MRI at outside orthopedic office several years ago. Normal EMG. Does have some midfoot arthritis on imaging today, which is likely confounding her symptoms.  - Will order new MRI to evaluate given the time since previous study.   Patient expresses understanding, and had no further questions or concerns today.      Procedures: No procedures performed  No notes on file     PMFS History: Patient Active Problem List   Diagnosis Date Noted  . Esophageal stricture 01/26/2011  . Arrhythmia 12/29/2010  . Essential hypertension 07/21/2010  . CONTACT DERMATITIS&OTHER ECZEMA DUE UNSPEC CAUSE 04/07/2010  . Hyperlipidemia 09/11/2007  . Migraine headache 09/11/2007  . ESOPHAGITIS, REFLUX 09/11/2007  . Hypothyroidism 01/26/2007   Past Medical History:  Diagnosis Date  . Basal cell carcinoma 2007   [facility]    . Breast cancer ([place])    Invasive lobular carcinoma RIGHT breast  . Carotid bruit 08/2008   Normal Doppler  . DUB (dysfunctional uterine bleeding)   . Esophagitis   . HTN (hypertension)    Tx started 06/2010  . Hyperlipidemia   . Hypothyroidism   . MHA (microangiopathic hemolytic anemia) ([facility])   . Personal history of chemotherapy    2003  . Personal history of radiation therapy    2003 right breast  . Spinal stenosis     Family History  Problem Relation Age of Onset  . Heart attack Father   . Coronary artery disease Father   . Diabetes Father   . Coronary artery disease Mother   . Leukemia Mother   . Thyroid disease Mother   . Multiple sclerosis Sister        Twin  . Breast cancer Maternal Aunt     Past Surgical History:  Procedure Laterality Date  . BREAST BIOPSY Right    2007 benign  . BREAST LUMPECTOMY Right    2003  . BTL    . CATARACT EXTRACTION, BILATERAL Bilateral 03/26/2016   still blurred vision; Md states this is fine  . CERVICAL LAMINECTOMY     Left  . FOOT SURGERY    . SHOULDER ARTHROSCOPY Right   . TUBAL LIGATION  1981   Social History   Occupational History  . Not on file  Tobacco Use  . Smoking status: Never Smoker  . Smokeless tobacco: Never Used  Substance and Sexual Activity  . Alcohol use: No  . Drug use: No  . Sexual activity: Not on file

## 2020-06-30 NOTE — Progress Notes (Signed)
I saw and examined the patient with Dr. Elouise Munroe and agree with assessment and plan as outlined.    Ongoing pain in both elbows, right greater than left.  Tender at medial epicondyle today. Will try golfer's elbow braces.  Consider injection or PT/OT if worsens.  Ongoing left leg pain with normal nerve studies.  History of stenosis per MRI many years ago.  PT done in past year or so did not help.  Will order new MRI.

## 2020-06-30 NOTE — Progress Notes (Signed)
  Chronic Care Management   Note  06/30/2020 Name: Patricia Solis MRN: [id number] DOB: 10/06/1950  Patricia Solis is a 69 y.o. year old female who is a primary care patient of Dorothyann Peng, NP. I reached out to Craige Cotta by phone today in response to a referral sent by [name]'s PCP, Dorothyann Peng, NP.   [name] was given information about Chronic Care Management services today including:  1. CCM service includes personalized support from designated clinical staff supervised by her physician, including individualized plan of care and coordination with other care providers 2. 24/7 contact phone numbers for assistance for urgent and routine care needs. 3. Service will only be billed when office clinical staff spend 20 minutes or more in a month to coordinate care. 4. Only one practitioner may furnish and bill the service in a calendar month. 5. The patient may stop CCM services at any time (effective at the end of the month) by phone call to the office staff.   Patient agreed to services and verbal consent obtained.   Follow up plan:   Carley Perdue [employer] Scheduler

## 2020-07-23 ENCOUNTER — Ambulatory Visit
Admission: RE | Admit: 2020-07-23 | Discharge: 2020-07-23 | Disposition: A | Discharge status: Home / Self Care | Payer: PPO | Source: Ambulatory Visit | Attending: Family Medicine | Admitting: Family Medicine

## 2020-07-23 DIAGNOSIS — M79605 Pain in left leg: Secondary | ICD-10-CM

## 2020-07-23 DIAGNOSIS — M5442 Lumbago with sciatica, left side: Secondary | ICD-10-CM

## 2020-07-23 DIAGNOSIS — M48061 Spinal stenosis, lumbar region without neurogenic claudication: Secondary | ICD-10-CM | POA: Diagnosis not present

## 2020-07-23 DIAGNOSIS — M545 Low back pain, unspecified: Secondary | ICD-10-CM | POA: Diagnosis not present

## 2020-07-24 ENCOUNTER — Telehealth: Payer: Self-pay | Admitting: Family Medicine

## 2020-07-24 NOTE — Telephone Encounter (Signed)
MRI shows some arthritis in the facet joints in the back of the spine at a couple levels.  Also a left-sided disc protrusion at the L3-4 level which could come in contact with the left L4 nerve root.  Could consider referral for epidural steroid injection to see if this will help eliminate pain.

## 2020-07-29 ENCOUNTER — Telehealth: Payer: Self-pay | Admitting: Pharmacist

## 2020-07-29 ENCOUNTER — Other Ambulatory Visit: Payer: Self-pay | Admitting: Adult Health

## 2020-07-29 DIAGNOSIS — I10 Essential (primary) hypertension: Secondary | ICD-10-CM

## 2020-07-29 DIAGNOSIS — E782 Mixed hyperlipidemia: Secondary | ICD-10-CM

## 2020-07-29 NOTE — Chronic Care Management (AMB) (Signed)
    Chronic Care Management Pharmacy Assistant   Name: Patricia Solis  MRN: [id number] DOB: 1950-10-11  Reason for Encounter: Medication Review/Initial Questions for Pharmacist visit on 07-30-2020  Patient Questions: 1. Have you seen any other providers since your last visit? No 2. Any changes in your medications or health? No 3. Any side effects from any medications? No 4. Do you have any symptoms or problems not managed by your medications? No 5. Any concerns about your health right now? No 6. Has your provider asked that you check blood pressure, blood sugar, or follow a special diet at home? No, but she does check it 3-4 times a week. 7. Do you get any type of exercise regularly?   Marland Kitchen [employer] 8. Can you think of a goal you would like to reach for your health?  Marland Kitchen She would like to lose some weight 9. Do you have any problems getting your medications? No 10. Is there anything that you would like to discuss during the appointment? No The patient was asked to please bring medications, blood pressure/ blood sugar log, and supplements to her appointment.   PCP : Shirline Frees, NP  Allergies:   Allergies  Allergen Reactions  . Ambien Cr [Zolpidem] Other (See Comments)    Hallucinations  . Cozaar [Losartan] Cough  . Erythromycin     REACTION: rash  . Hydrocodone Other (See Comments)    Hallucinations  . Lisinopril Cough  . Penicillins     REACTION: rash  . Clindamycin/Lincomycin Rash    Medications: Outpatient Encounter Medications as of 07/29/2020  Medication Sig Note  . aspirin 81 MG EC tablet Take 81 mg by mouth daily.     . B Complex-C (B-COMPLEX WITH VITAMIN C) tablet Take 1 tablet by mouth daily.   . calcium carbonate (OS-CAL) 600 MG TABS tablet Take 1 tablet (600 mg total) by mouth 2 (two) times daily with a meal.   . Cholecalciferol (VITAMIN D3 PO) Take by mouth. 06/30/2020: 5,000 iu qd  . co-enzyme Q-10 30 MG capsule Take 30 mg by mouth 3 (three) times daily.   Marland Kitchen  CRANBERRY PO Take by mouth.   Marland Kitchen GLUCOSAMINE PO Take 1 tablet by mouth 2 (two) times daily.   . hydrochlorothiazide (HYDRODIURIL) 25 MG tablet Take 1 tablet (25 mg total) by mouth daily.   . hydrocortisone 2.5 % ointment Apply topically 2 (two) times daily. 03/26/2020: Prn skin rash  . levothyroxine (EUTHYROX) 75 MCG tablet Take 1 tablet (75 mcg total) by mouth daily before breakfast.   . magnesium oxide (MAG-OX) 400 MG tablet Take 400 mg by mouth daily.   Marland Kitchen omeprazole (PRILOSEC) 20 MG capsule Take 1 capsule by mouth once daily   . simvastatin (ZOCOR) 40 MG tablet Take 1 tablet by mouth every night at bedtime    No facility-administered encounter medications on file as of 07/29/2020.    Current Diagnosis: Patient Active Problem List   Diagnosis Date Noted  . Esophageal stricture 01/26/2011  . Arrhythmia 12/29/2010  . Essential hypertension 07/21/2010  . CONTACT DERMATITIS&OTHER ECZEMA DUE UNSPEC CAUSE 04/07/2010  . Hyperlipidemia 09/11/2007  . Migraine headache 09/11/2007  . ESOPHAGITIS, REFLUX 09/11/2007  . Hypothyroidism 01/26/2007    Goals Addressed   None     Follow-Up:  Pharmacist Review   Berenice Bouton, [facility] Clinical Pharmacy Assistant [phone]

## 2020-07-30 ENCOUNTER — Ambulatory Visit: Payer: PPO | Admitting: Pharmacist

## 2020-07-30 DIAGNOSIS — E782 Mixed hyperlipidemia: Secondary | ICD-10-CM

## 2020-07-30 DIAGNOSIS — I10 Essential (primary) hypertension: Secondary | ICD-10-CM

## 2020-07-30 NOTE — Chronic Care Management (AMB) (Signed)
Chronic Care Management Pharmacy  Name: Patricia Solis  MRN: [id number] DOB: 07/10/1951  Initial Planning Appointment: completed 07/29/20  Initial Questions: 1. Have you seen any other providers since your last visit? n/a 2. Any changes in your medicines or health? No   Chief Complaint/ HPI  Patricia Solis,  70 y.o. , female presents for their Initial CCM visit with the clinical pharmacist via telephone due to COVID-19 Pandemic.  PCP : Dorothyann Peng, NP  Their chronic conditions include: HTN, HLD, hypothyroidism, GERD, prevention of heart events  Office Visits: -05/23/20 Ofilia Neas, LPN: Patient presented for medicare annual wellness visit.  -05/22/20 Dorothyann Peng, NP: Patient presented for annual exam. Switched simvastatin to rosuvastatin 40 mg daily.  Consult Visit: -06/30/20 Eunice Blase, MD (ortho): Patient presented for follow up for joint pain.  -03/26/20 Eunice Blase, MD (ortho): Patient presented for follow up for joint pain and left leg pain. Vitamin D was low and recommended 5000 units daily.  -02/19/20 Dian Queen (OBGYN): Patient presented for routine exam. Unable to access notes.  -02/01/20 Eunice Blase, MD (ortho): Patient presented for follow up for joint pain and left leg pain. Referral placed to physical medicine.  -01/25/20 Camillo Flaming (dermatology): Unable to access notes.  Medications: Outpatient Encounter Medications as of 07/30/2020  Medication Sig Note  . aspirin 81 MG EC tablet Take 81 mg by mouth daily.   . B Complex-C (B-COMPLEX WITH VITAMIN C) tablet Take 1 tablet by mouth daily.   . calcium carbonate (OS-CAL) 600 MG TABS tablet Take 1 tablet (600 mg total) by mouth 2 (two) times daily with a meal.   . Cholecalciferol (VITAMIN D3 PO) Take by mouth. 06/30/2020: 5,000 iu qd  . Coenzyme Q10 200 MG TABS Take 200 mg by mouth daily.   Marland Kitchen CRANBERRY PO Take 2 tablets by mouth at bedtime.   Marland Kitchen GLUCOSAMINE PO Take 1 tablet by mouth 2 (two)  times daily.   . hydrocortisone 2.5 % ointment Apply topically 2 (two) times daily. 03/26/2020: Prn skin rash  . magnesium oxide (MAG-OX) 400 MG tablet Take 400 mg by mouth daily.   . [DISCONTINUED] hydrochlorothiazide (HYDRODIURIL) 25 MG tablet Take 1 tablet (25 mg total) by mouth daily.   . [DISCONTINUED] levothyroxine (EUTHYROX) 75 MCG tablet Take 1 tablet (75 mcg total) by mouth daily before breakfast.   . [DISCONTINUED] omeprazole (PRILOSEC) 20 MG capsule Take 1 capsule by mouth once daily   . [DISCONTINUED] simvastatin (ZOCOR) 40 MG tablet Take 1 tablet by mouth every night at bedtime    No facility-administered encounter medications on file as of 07/30/2020.   Patient reports she is active with the [facility] and goes every Monday, Wednesday, and Friday. She gets up at 4am and goes to the [facility] and stays for about 1.5-2 hours. Her workout includes walking 1 mile on the track, using 6 machines, and then another 1.5 miles. She is also active doing yardwork in the fall with raking leaves and getting acorns.  She lives with her husband and does the cleaning and cooking. She mixes it up between getting food out every once in a while. At home, she makes soup, brunswick stew,and chicken & vegetables. She drinks mostly water and used to drink coffee all the time but has backed off on that. She sometimes drinks OJ, but does not drink soft drinks or tea and does admit to having a sweet tooth.  Patient sleeps pretty well most nights but has some pain on some  nights. Her left leg has been hurting and she takes Advil very seldom for it. She also uses biofreeze sometimes. It started out as sharp pains in her knee and ankle.   Patient does not like taking medicines but knows she needs to. She cannot take pain medicines or Ambien as she had significant side effects.   Current Diagnosis/Assessment:  Goals Addressed            This Visit's Progress   . Pharmacy Care Plan       CARE PLAN ENTRY (see  longitudinal plan of care for additional care plan information)  Current Barriers:  . Chronic Disease Management support, education, and care coordination needs related to Hypertension, Hyperlipidemia, GERD, and Hypothyroidism   Hypertension BP Readings from Last 3 Encounters:  05/22/20 132/80  10/23/19 130/72  07/09/19 125/67   . Pharmacist Clinical Goal(s): o Over the next 120 days, patient will work with PharmD and providers to maintain BP goal <140/90 . Current regimen:  o Hydrochlorothiazide 25 mg 1 tablet daily . Interventions: o Discussed DASH eating plan recommendations: . Emphasizes vegetables, fruits, and whole-grains . Includes fat-free or low-fat dairy products, fish, poultry, beans, nuts, and vegetable oils . Limits foods that are high in saturated fat. These foods include fatty meats, full-fat dairy products, and tropical oils such as coconut, palm kernel, and palm oils. . Limits sugar-sweetened beverages and sweets . Limiting sodium intake to < 1500 mg/day . Patient self care activities - Over the next 120 days, patient will: o Check BP weekly, document, and provide at future appointments o Ensure daily salt intake < 2300 mg/day  Hyperlipidemia Lab Results  Component Value Date/Time   LDLCALC 131 (H) 05/22/2020 08:26 AM   LDLDIRECT 140.4 09/02/2006 10:13 AM   . Pharmacist Clinical Goal(s): o Over the next 120 days, patient will work with PharmD and providers to achieve LDL goal < 100 . Current regimen:  o Simvastatin 40 mg 1 tablet at bedtime  . Interventions: o Discussed lowering cholesterol through diet by: Marland Kitchen Limiting foods with cholesterol such as liver and other organ meats, egg yolks, shrimp, and whole milk dairy products . Avoiding saturated fats and trans fats and incorporating healthier fats, such as lean meat, nuts, and unsaturated oils like canola and olive oils . Eating foods with soluble fiber such as whole-grain cereals such as oatmeal and oat bran,  fruits such as apples, bananas, oranges, pears, and prunes, legumes such as kidney beans, lentils, chick peas, black-eyed peas, and lima beans, and green leafy vegetables . Limiting alcohol intake  . Patient self care activities - Over the next 120 days, patient will: o Continue current medications  Hypothyroidism Lab Results  Component Value Date   TSH 2.92 05/22/2020   . Pharmacist Clinical Goal(s): o Over the next 120 days, patient will work with PharmD and providers to maintain TSH between 0.4 to 4.5 . Current regimen:  o Levothyroxine 75 mcg 1 tablet daily before breakfast . Interventions: o We discussed consistent administration on an empty stomach prior to other medications and with water . Patient self care activities - Over the next 120 days, patient will: o Continue current medication  GERD . Pharmacist Clinical Goal(s) o Over the next 120 days, patient will work with PharmD and providers to manage symptoms of heartburn . Current regimen:  o Omeprazole 20 mg 1 capsule daily . Interventions: o Discussed non-pharmacologic management of symptoms such as elevating the head of your bed, avoiding eating 2-3 hours  before bed, avoiding triggering foods such as acidic, spicy, or fatty foods, eating smaller meals, and wearing clothes that are loose around the waist  . Patient self care activities - Over the next 120 days, patient will: o Continue current medication  Medication management . Pharmacist Clinical Goal(s): o Over the next 120 days, patient will work with PharmD and providers to maintain optimal medication adherence . Current pharmacy: [facility] . Interventions o Comprehensive medication review performed. o Continue current medication management strategy . Patient self care activities - Over the next 120 days, patient will: o Take medications as prescribed o Report any questions or concerns to PharmD and/or provider(s)  Initial goal documentation       SDOH  Interventions   Flowsheet Row Most Recent Value  SDOH Interventions   Financial Strain Interventions Intervention Not Indicated  Transportation Interventions Intervention Not Indicated      Hypertension   BP goal is:  <140/90  Office blood pressures are  BP Readings from Last 3 Encounters:  05/22/20 132/80  10/23/19 130/72  07/09/19 125/67   Patient checks BP at home infrequently Patient home BP readings are ranging: 120/73 HR 94, 120/73 HR 89, 137/69 HR 80  Patient has failed these meds in the past: none Patient is currently controlled on the following medications:  . Hydrochlorothiazide 25 mg 1 tablet daily  We discussed diet and exercise extensively  -DASH eating plan recommendations: . Emphasizes vegetables, fruits, and whole-grains . Includes fat-free or low-fat dairy products, fish, poultry, beans, nuts, and vegetable oils . Limits foods that are high in saturated fat. These foods include fatty meats, full-fat dairy products, and tropical oils such as coconut, palm kernel, and palm oils. . Limits sugar-sweetened beverages and sweets . Limiting sodium intake to < 1500 mg/day -Diet: puts some salt in foods but not on the table (only to season)  Plan  Continue current medications     Hyperlipidemia   LDL goal < 100  Last lipids Lab Results  Component Value Date   CHOL 225 (H) 05/22/2020   HDL 74 05/22/2020   LDLCALC 131 (H) 05/22/2020   LDLDIRECT 140.4 09/02/2006   TRIG 95 05/22/2020   CHOLHDL 3.0 05/22/2020   Hepatic Function Latest Ref Rng & Units 05/22/2020 07/09/2019 05/11/2019  Total Protein 6.1 - 8.1 g/dL 7.0 6.4(L) 6.9  Albumin 3.5 - 5.0 g/dL - 3.7 4.5  AST 10 - 35 U/L 15 137(H) 15  ALT 6 - 29 U/L 15 84(H) 19  Alk Phosphatase 38 - 126 U/L - 79 71  Total Bilirubin 0.2 - 1.2 mg/dL 0.5 0.6 0.5  Bilirubin, Direct 0.0 - 0.2 mg/dL - 0.1 -     The ASCVD Risk score Mikey Bussing DC Jr., et al., 2013) failed to calculate for the following reasons:   The  systolic blood pressure is missing   Patient has failed these meds in past: none Patient is currently uncontrolled on the following medications:  . Simvastatin 40 mg 1 tablet at bedtime   We discussed:  diet and exercise extensively  -Lowering cholesterol through diet by: Marland Kitchen Limiting foods with cholesterol such as liver and other organ meats, egg yolks, shrimp, and whole milk dairy products . Avoiding saturated fats and trans fats and incorporating healthier fats, such as lean meat, nuts, and unsaturated oils like canola and olive oils . Eating foods with soluble fiber such as whole-grain cereals such as oatmeal and oat bran, fruits such as apples, bananas, oranges, pears, and prunes, legumes such  as kidney beans, lentils, chick peas, black-eyed peas, and lima beans, and green leafy vegetables . Limiting alcohol intake  Plan Will discuss with PCP about sending the prescription for rosuvastatin that was supposed to be sent in Oct 2021.  Hypothyroidism   Lab Results  Component Value Date/Time   TSH 2.92 05/22/2020 08:26 AM   TSH 1.23 10/04/2019 11:09 AM   FREET4 0.80 12/29/2010 04:56 PM    Patient has failed these meds in past: none Patient is currently controlled on the following medications:  . Levothyroxine 75 mcg 1 tablet daily before breakfast  We discussed:  consistent administration on an empty stomach prior to other medications and with water  Plan  Continue current medications   GERD   Patient has failed these meds in past: none Patient is currently controlled on the following medications:  . Omeprazole 20 mg 1 capsule daily  We discussed:  Non-pharmacologic management of symptoms such as elevating the head of your bed, avoiding eating 2-3 hours before bed, avoiding triggering foods such as acidic, spicy, or fatty foods, eating smaller meals, and wearing clothes that are loose around the waist  Triggers: spicy and fried foods most nights tries to eat a little  something before bed  Plan Patient will stop eating before bedtime to help reduce GERD symptoms. Continue current medications  Prevention of heart events   Patient has failed these meds in past: none Patient is currently controlled on the following medications:  . Aspirin 81 mg 1 tablet daily  We discussed:  Monitoring for signs of bleeding such as unexplained and excessive bleeding from a cut or injury, easy or excessive bruising, blood in urine or stools, and nosebleeds without a known cause; preference of Tylenol over NSAIDs due to bleed risk  Plan  Continue current medications   Miscellaneous/OTC   Last vitamin D Lab Results  Component Value Date   VD25OH 47 05/22/2020    Patient is currently on the following medications:  Marland Kitchen Vitamin B complex w/vitamin C 1 tablet daily . Calcium carbonate 600 mg 1 tablet twice daily . Vitamin D3 5000 units 1 tablet daily . Coenzyme Q10 30 mg 1 capsule daily . Cranberry daily . Glucosamine 1 tablet twice daily . Hydrocortisone 2.5% ointment apply twice daily . Magnesium oxide 400 mg 1 tablet daily  Plan  Continue current medications  Vaccines   Reviewed and discussed patient's vaccination history.    Immunization History  Administered Date(s) Administered  . Fluad Quad(high Dose 65+) 05/11/2019, 05/22/2020  . Influenza Whole 07/26/2005, 04/25/2009  . Influenza, High Dose Seasonal PF 04/27/2016, 04/28/2017, 05/02/2018  . Influenza,inj,quad, With Preservative 04/25/2017  . PFIZER(Purple Top)SARS-COV-2 Vaccination 09/01/2019, 09/26/2019, 04/21/2020  . Pneumococcal Conjugate-13 04/27/2016  . Pneumococcal Polysaccharide-23 04/28/2017  . Td 07/26/2001  . Tdap 09/21/2011  . Zoster 07/11/2012  . Zoster Recombinat (Shingrix) 04/20/2017, 08/11/2017, 09/08/2017    Plan  Patient is up to date on all immunizations.  Medication Management   Patient's preferred pharmacy is:  [facility] [address] ([address]), [place] [address] [place] ([place]) [place] [address] Phone: [phone] Fax: [phone]  Uses pill box? No - cabinet - morning on the left and nighttime on the right Pt endorses 90% compliance - miss the night medicine  We discussed: Current pharmacy is preferred with insurance plan and patient is satisfied with pharmacy services  Plan  Continue current medication management strategy  Follow up: 4 month phone visit  Jeni Salles,  PharmD [facility] [phone]

## 2020-07-31 ENCOUNTER — Other Ambulatory Visit: Payer: Self-pay | Admitting: Adult Health

## 2020-07-31 ENCOUNTER — Telehealth: Payer: Self-pay | Admitting: Adult Health

## 2020-07-31 DIAGNOSIS — E039 Hypothyroidism, unspecified: Secondary | ICD-10-CM

## 2020-07-31 DIAGNOSIS — Z760 Encounter for issue of repeat prescription: Secondary | ICD-10-CM

## 2020-07-31 MED ORDER — LEVOTHYROXINE SODIUM 75 MCG PO TABS: 75.0000 ug | ORAL_TABLET | Freq: Every day | ORAL | 0 refills | Status: DC

## 2020-07-31 MED ORDER — HYDROCHLOROTHIAZIDE 25 MG PO TABS: 25.0000 mg | ORAL_TABLET | Freq: Every day | ORAL | 3 refills | Status: DC

## 2020-07-31 MED ORDER — OMEPRAZOLE 20 MG PO CPDR: 20.0000 mg | DELAYED_RELEASE_CAPSULE | Freq: Every day | ORAL | 0 refills | Status: DC

## 2020-07-31 MED ORDER — SIMVASTATIN 40 MG PO TABS: ORAL_TABLET | ORAL | 3 refills | Status: DC

## 2020-07-31 NOTE — Telephone Encounter (Signed)
Pt need refill for:  Levothyroxine (euthyrox) Omeprazole (prilosec) 20 mg Simvastatin (zocor) 40mg  Hydrochlorothiazide 25mg   Send to:  [id number] [place] ([place]), [place] - [address]  [address], [place] ([place]) [employer] [address]  Phone:  [phone] Fax:  [phone]

## 2020-07-31 NOTE — Telephone Encounter (Signed)
Refills sent

## 2020-08-01 NOTE — Telephone Encounter (Signed)
The original prescription was reordered on 07/31/2020 by Amado Coe, CMA. Renewing this prescription may not be appropriate.  These are duplicate requests.

## 2020-08-14 ENCOUNTER — Other Ambulatory Visit: Payer: Self-pay | Admitting: Adult Health

## 2020-08-14 MED ORDER — ROSUVASTATIN CALCIUM 40 MG PO TABS: 40.0000 mg | ORAL_TABLET | Freq: Every day | ORAL | 3 refills | Status: DC

## 2020-08-14 NOTE — Patient Instructions (Addendum)
Hi Patricia Solis,  It was lovely to get to meet you over the phone! As we had discussed, continue with checking your blood pressure regularly and taking your medications as prescribed. I attached some hopefully helpful information about trying to reduce heartburn in addition to what we discussed about avoiding eating before bedtime. My hope is to keep you as healthy as possible and will likely have my assistant Mimi reach out to you sooner than our follow up just to check in!  Please give me a call if you have any questions or need anything before our follow up!  Jeni Salles, PharmD [facility] Clinical Pharmacist [facility] [place]    Visit Information  Goals Addressed            This Visit's Progress   . Pharmacy Care Plan       CARE PLAN ENTRY (see longitudinal plan of care for additional care plan information)  Current Barriers:  . Chronic Disease Management support, education, and care coordination needs related to Hypertension, Hyperlipidemia, GERD, and Hypothyroidism   Hypertension BP Readings from Last 3 Encounters:  05/22/20 132/80  10/23/19 130/72  07/09/19 125/67   . Pharmacist Clinical Goal(s): o Over the next 120 days, patient will work with PharmD and providers to maintain BP goal <140/90 . Current regimen:  o Hydrochlorothiazide 25 mg 1 tablet daily . Interventions: o Discussed DASH eating plan recommendations: . Emphasizes vegetables, fruits, and whole-grains . Includes fat-free or low-fat dairy products, fish, poultry, beans, nuts, and vegetable oils . Limits foods that are high in saturated fat. These foods include fatty meats, full-fat dairy products, and tropical oils such as coconut, palm kernel, and palm oils. . Limits sugar-sweetened beverages and sweets . Limiting sodium intake to < 1500 mg/day . Patient self care activities - Over the next 120 days, patient will: o Check BP weekly, document, and provide at future  appointments o Ensure daily salt intake < 2300 mg/day  Hyperlipidemia Lab Results  Component Value Date/Time   LDLCALC 131 (H) 05/22/2020 08:26 AM   LDLDIRECT 140.4 09/02/2006 10:13 AM   . Pharmacist Clinical Goal(s): o Over the next 120 days, patient will work with PharmD and providers to achieve LDL goal < 100 . Current regimen:  o Simvastatin 40 mg 1 tablet at bedtime  . Interventions: o Discussed lowering cholesterol through diet by: Marland Kitchen Limiting foods with cholesterol such as liver and other organ meats, egg yolks, shrimp, and whole milk dairy products . Avoiding saturated fats and trans fats and incorporating healthier fats, such as lean meat, nuts, and unsaturated oils like canola and olive oils . Eating foods with soluble fiber such as whole-grain cereals such as oatmeal and oat bran, fruits such as apples, bananas, oranges, pears, and prunes, legumes such as kidney beans, lentils, chick peas, black-eyed peas, and lima beans, and green leafy vegetables . Limiting alcohol intake  . Patient self care activities - Over the next 120 days, patient will: o Continue current medications  Hypothyroidism Lab Results  Component Value Date   TSH 2.92 05/22/2020   . Pharmacist Clinical Goal(s): o Over the next 120 days, patient will work with PharmD and providers to maintain TSH between 0.4 to 4.5 . Current regimen:  o Levothyroxine 75 mcg 1 tablet daily before breakfast . Interventions: o We discussed consistent administration on an empty stomach prior to other medications and with water . Patient self care activities - Over the next 120 days, patient will: o Continue current  medication  GERD . Pharmacist Clinical Goal(s) o Over the next 120 days, patient will work with PharmD and providers to manage symptoms of heartburn . Current regimen:  o Omeprazole 20 mg 1 capsule daily . Interventions: o Discussed non-pharmacologic management of symptoms such as elevating the head of your  bed, avoiding eating 2-3 hours before bed, avoiding triggering foods such as acidic, spicy, or fatty foods, eating smaller meals, and wearing clothes that are loose around the waist  . Patient self care activities - Over the next 120 days, patient will: o Continue current medication  Medication management . Pharmacist Clinical Goal(s): o Over the next 120 days, patient will work with PharmD and providers to maintain optimal medication adherence . Current pharmacy: [facility] . Interventions o Comprehensive medication review performed. o Continue current medication management strategy . Patient self care activities - Over the next 120 days, patient will: o Take medications as prescribed o Report any questions or concerns to PharmD and/or provider(s)  Initial goal documentation        Ms. Duchesneau was given information about Chronic Care Management services today including:  1. CCM service includes personalized support from designated clinical staff supervised by her physician, including individualized plan of care and coordination with other care providers 2. 24/7 contact phone numbers for assistance for urgent and routine care needs. 3. Standard insurance, coinsurance, copays and deductibles apply for chronic care management only during months in which we provide at least 20 minutes of these services. Most insurances cover these services at 100%, however patients may be responsible for any copay, coinsurance and/or deductible if applicable. This service may help you avoid the need for more expensive face-to-face services. 4. Only one practitioner may furnish and bill the service in a calendar month. 5. The patient may stop CCM services at any time (effective at the end of the month) by phone call to the office staff.  Patient agreed to services and verbal consent obtained.   The patient verbalized understanding of instructions, educational materials, and care plan provided today and agreed to  receive a mailed copy of patient instructions, educational materials, and care plan.  Telephone follow up appointment with pharmacy team member scheduled for: 4 months  Viona Gilmore, [place] [facility] Ref[facility], Adult When you have gastroesophageal reflux disease (GERD), the foods you eat and your eating habits are very important. Choosing the right foods can help ease your discomfort. Think about working with a food expert (dietitian) to help you make good choices. What are tips for following this plan? Reading food labels  Look for foods that are low in saturated fat. Foods that may help with your symptoms include: ? Foods that have less than 5% of daily value (DV) of fat. ? Foods that have 0 grams of trans fat. Cooking  Do not fry your food.  Cook your food by baking, steaming, grilling, or broiling. These are all methods that do not need a lot of fat for cooking.  To add flavor, try to use herbs that are low in spice and acidity. Meal planning  Choose healthy foods that are low in fat, such as: ? Fruits and vegetables. ? Whole grains. ? Low-fat dairy products. ? Lean meats, fish, and poultry.  Eat small meals often instead of eating 3 large meals each day. Eat your meals slowly in a place where you are relaxed. Avoid bending over or lying down until 2-3 hours after eating.  Limit high-fat foods  such as fatty meats or fried foods.  Limit your intake of fatty foods, such as oils, butter, and shortening.  Avoid the following as told by your doctor: ? Foods that cause symptoms. These may be different for different people. Keep a food diary to keep track of foods that cause symptoms. ? Alcohol. ? Drinking a lot of liquid with meals. ? Eating meals during the 2-3 hours before bed.   Lifestyle  Stay at a healthy weight. Ask your doctor what weight is healthy for you. If you need to lose weight, work with your doctor to do so safely.  Exercise for  at least 30 minutes on 5 or more days each week, or as told by your doctor.  Wear loose-fitting clothes.  Do not smoke or use any products that contain nicotine or tobacco. If you need help quitting, ask your doctor.  Sleep with the head of your bed higher than your feet. Use a wedge under the mattress or blocks under the bed frame to raise the head of the bed.  Chew sugar-free gum after meals. What foods should eat? Eat a healthy, well-balanced diet of fruits, vegetables, whole grains, low-fat dairy products, lean meats, fish, and poultry. Each person is different. Foods that may cause symptoms in one person may not cause any symptoms in another person. Work with your doctor to find foods that are safe for you. The items listed above may not be a complete list of what you can eat and drink. Contact a food expert for more options.   What foods should I avoid? Limiting some of these foods may help in managing the symptoms of GERD. Everyone is different. Talk with a food expert or your doctor to help you find the exact foods to avoid, if any. Fruits Any fruits prepared with added fat. Any fruits that cause symptoms. For some people, this may include citrus fruits, such as oranges, grapefruit, pineapple, and lemons. Vegetables Deep-fried vegetables. [place] fries. Any vegetables prepared with added fat. Any vegetables that cause symptoms. For some people, this may include tomatoes and tomato products, chili peppers, onions and garlic, and horseradish. Grains Pastries or quick breads with added fat. Meats and other proteins High-fat meats, such as fatty beef or pork, hot dogs, ribs, ham, sausage, salami, and bacon. Fried meat or protein, including fried fish and fried chicken. Nuts and nut butters, in large amounts. Dairy Whole milk and chocolate milk. Sour cream. Cream. Ice cream. Cream cheese. Milkshakes. Fats and oils Butter. Margarine. Shortening. Ghee. Beverages Coffee and tea, with or  without caffeine. Carbonated beverages. Sodas. Energy drinks. Fruit juice made with acidic fruits, such as orange or grapefruit. Tomato juice. Alcoholic drinks. Sweets and desserts Chocolate and cocoa. Donuts. Seasonings and condiments Pepper. Peppermint and spearmint. Added salt. Any condiments, herbs, or seasonings that cause symptoms. For some people, this may include curry, hot sauce, or vinegar-based salad dressings. The items listed above may not be a complete list of what you should not eat and drink. Contact a food expert for more options. Questions to ask your doctor Diet and lifestyle changes are often the first steps that are taken to manage symptoms of GERD. If diet and lifestyle changes do not help, talk with your doctor about taking medicines. Where to find more information  International Foundation for Gastrointestinal Disorders: aboutgerd.org Summary  When you have GERD, food and lifestyle choices are very important in easing your symptoms.  Eat small meals often instead of 3 large meals  a day. Eat your meals slowly and in a place where you are relaxed.  Avoid bending over or lying down until 2-3 hours after eating.  Limit high-fat foods such as fatty meats or fried foods. This information is not intended to replace advice given to you by your health care provider. Make sure you discuss any questions you have with your health care provider. Document Revised: 01/21/2020 Document Reviewed: 01/21/2020 Elsevier Patient Education  [place].

## 2020-09-05 DIAGNOSIS — H26493 Other secondary cataract, bilateral: Secondary | ICD-10-CM | POA: Diagnosis not present

## 2020-09-05 DIAGNOSIS — H52203 Unspecified astigmatism, bilateral: Secondary | ICD-10-CM | POA: Diagnosis not present

## 2020-09-05 DIAGNOSIS — Z961 Presence of intraocular lens: Secondary | ICD-10-CM | POA: Diagnosis not present

## 2020-09-05 DIAGNOSIS — H5213 Myopia, bilateral: Secondary | ICD-10-CM | POA: Diagnosis not present

## 2020-09-05 DIAGNOSIS — H524 Presbyopia: Secondary | ICD-10-CM | POA: Diagnosis not present

## 2020-09-29 ENCOUNTER — Other Ambulatory Visit: Payer: Self-pay

## 2020-09-29 ENCOUNTER — Ambulatory Visit (INDEPENDENT_AMBULATORY_CARE_PROVIDER_SITE_OTHER): Payer: PPO | Admitting: Family Medicine

## 2020-09-29 ENCOUNTER — Encounter: Payer: Self-pay | Admitting: Family Medicine

## 2020-09-29 VITALS — BP 128/70 | HR 82 | Temp 98.8°F | Wt 153.0 lb

## 2020-09-29 DIAGNOSIS — K219 Gastro-esophageal reflux disease without esophagitis: Secondary | ICD-10-CM | POA: Diagnosis not present

## 2020-09-29 MED ORDER — FAMOTIDINE 40 MG PO TABS: 40.0000 mg | ORAL_TABLET | Freq: Every day | ORAL | 0 refills | Status: DC

## 2020-09-29 NOTE — Progress Notes (Signed)
   Subjective:    Patient ID: Patricia Solis, female    DOB: [birth year], 70 y.o.   MRN: [id number]  HPI Here for 2 weeks of a pressure sensation in the back of the throat that makes her feel like she might choke. However she does not choke. No pain or ST. No trouble swallowing food or liquids. She takes Omeprazole 20 mg every morning, and this works for the most part. She occasoinally has breakthrough heartbuen. No cough or hoarse voice.    Review of Systems  Constitutional: Negative.   HENT: Negative for sore throat, trouble swallowing and voice change.   Eyes: Negative.   Respiratory: Negative.   Cardiovascular: Negative.   Gastrointestinal: Negative.        Objective:   Physical Exam Constitutional:      General: She is not in acute distress.    Appearance: Normal appearance.  HENT:     Nose: Nose normal.     Mouth/Throat:     Pharynx: Oropharynx is clear.  Eyes:     Conjunctiva/sclera: Conjunctivae normal.  Cardiovascular:     Rate and Rhythm: Normal rate and regular rhythm.     Pulses: Normal pulses.     Heart sounds: Normal heart sounds.  Pulmonary:     Effort: Pulmonary effort is normal.     Breath sounds: Normal breath sounds.  Musculoskeletal:     Cervical back: Neck supple. No rigidity.  Lymphadenopathy:     Cervical: No cervical adenopathy.  Neurological:     Mental Status: She is alert.           Assessment & Plan:  Choking sensation, likely the result of silent GERD at night. She will continue the morning Omeprazole, but we will add Famotidine 40 mg at bedtime. Follow up as needed.  Alysia Penna, MD

## 2020-10-19 ENCOUNTER — Encounter: Payer: Self-pay | Admitting: Adult Health

## 2020-10-20 MED ORDER — FAMOTIDINE 40 MG PO TABS: 40.0000 mg | ORAL_TABLET | Freq: Every day | ORAL | 0 refills | Status: DC

## 2020-10-22 ENCOUNTER — Other Ambulatory Visit: Payer: Self-pay | Admitting: Adult Health

## 2020-10-22 DIAGNOSIS — Z760 Encounter for issue of repeat prescription: Secondary | ICD-10-CM

## 2020-10-22 DIAGNOSIS — E039 Hypothyroidism, unspecified: Secondary | ICD-10-CM

## 2020-11-11 ENCOUNTER — Other Ambulatory Visit: Payer: Self-pay | Admitting: Adult Health

## 2020-11-14 ENCOUNTER — Telehealth: Payer: Self-pay | Admitting: Pharmacist

## 2020-11-14 NOTE — Chronic Care Management (AMB) (Signed)
Chronic Care Management Pharmacy Assistant   Name: Patricia Solis  MRN: [id number] DOB: 12-16-1950  Reason for Encounter: Disease State and Medication Review   Conditions to be addressed/monitored: HTN  Recent office visits:  . 03.07.2022 Laurey Morale, MD Family Medicine o Medication prescribed  o Famotidine 40 mg at bedtime  Recent consult visits:  . 02.11.2022 Hyman Hopes, MD Ophthalmology  Hospital visits:  None in previous 6 months   Medications: Outpatient Encounter Medications as of 11/14/2020  Medication Sig Note  . aspirin 81 MG EC tablet Take 81 mg by mouth daily.   . B Complex-C (B-COMPLEX WITH VITAMIN C) tablet Take 1 tablet by mouth daily.   . calcium carbonate (OS-CAL) 600 MG TABS tablet Take 1 tablet (600 mg total) by mouth 2 (two) times daily with a meal.   . Cholecalciferol (VITAMIN D3 PO) Take by mouth. 06/30/2020: 5,000 iu qd  . Coenzyme Q10 200 MG TABS Take 200 mg by mouth daily.   Marland Kitchen CRANBERRY PO Take 2 tablets by mouth at bedtime.   . EUTHYROX 75 MCG tablet TAKE 1 TABLET BY MOUTH ONCE DAILY BEFORE BREAKFAST   . famotidine (PEPCID) 40 MG tablet TAKE 1 TABLET BY MOUTH AT BEDTIME   . GLUCOSAMINE PO Take 1 tablet by mouth 2 (two) times daily.   . hydrochlorothiazide (HYDRODIURIL) 25 MG tablet Take 1 tablet (25 mg total) by mouth daily.   . hydrocortisone 2.5 % ointment Apply topically 2 (two) times daily. 03/26/2020: Prn skin rash  . magnesium oxide (MAG-OX) 400 MG tablet Take 400 mg by mouth daily.   Marland Kitchen omeprazole (PRILOSEC) 20 MG capsule Take 1 capsule by mouth once daily   . rosuvastatin (CRESTOR) 40 MG tablet Take 1 tablet (40 mg total) by mouth daily.    No facility-administered encounter medications on file as of 11/14/2020.   Reviewed chart prior to disease state call. Spoke with patient regarding BP  Recent Office Vitals: BP Readings from Last 3 Encounters:  09/29/20 128/70  05/22/20 132/80  10/23/19 130/72   Pulse Readings from  Last 3 Encounters:  09/29/20 82  05/22/20 77  07/09/19 80    Wt Readings from Last 3 Encounters:  09/29/20 153 lb (69.4 kg)  05/23/20 154 lb (69.9 kg)  05/22/20 154 lb (69.9 kg)     Kidney Function Lab Results  Component Value Date/Time   CREATININE 0.64 05/22/2020 08:26 AM   CREATININE 0.66 07/24/2019 07:44 AM   CREATININE 0.70 07/09/2019 03:50 AM   GFR 88.95 07/24/2019 07:44 AM   GFRNONAA 91 05/22/2020 08:26 AM   GFRAA 106 05/22/2020 08:26 AM    BMP Latest Ref Rng & Units 05/22/2020 07/24/2019 07/09/2019  Glucose 65 - 99 mg/dL 89 92 103(H)  BUN 7 - 25 mg/dL 21 27(H) 22  Creatinine 0.50 - 0.99 mg/dL 0.64 0.66 0.70  BUN/Creat Ratio 6 - 22 (calc) NOT APPLICABLE - -  Sodium 409 - 146 mmol/L 140 141 139  Potassium 3.5 - 5.3 mmol/L 4.4 3.6 2.9(L)  Chloride 98 - 110 mmol/L 99 103 100  CO2 20 - 32 mmol/L 28 29 -  Calcium 8.6 - 10.4 mg/dL 9.7 9.5 -    . Current antihypertensive regimen:  ? Hydrochlorothiazide 25 mg 1 tablet daily . How often are you checking your Blood Pressure? daily . Current home BP readings:  o 04.19 122/71 o 04.20 129/73 o 04.21 124/70 o 04.22 130/70 . What recent interventions/DTPs have been made by any  provider to improve Blood Pressure control since last CPP Visit: None . Any recent hospitalizations or ED visits since last visit with CPP? No . What diet changes have been made to improve Blood Pressure Control?  o No change . What exercise is being done to improve your Blood Pressure Control?  o Gym 3 days and Silver sneaker 2 days a week.  Adherence Review: Is the patient currently on ACE/ARB medication? No Does the patient have >5 day gap between last estimated fill dates? No  I spoke with the patient and discuss medication adherence. She states that she has been doing well recently. She started famotidine 40 mg daily a bedtime to help with her GERD. she has had no side effects from this medication, or her other medications prescribed. She  states that she stays very active. She takes her blood pressure daily. She walks one mile every other day and has a silver sneaker class two days a week. She has not had any ED or urgent care visits since her last CPP or PCP visit. She denies any problems with our current pharmacy.  Star Rating Drugs:  Dispensed Quantity Pharmacy  Rosuvastatin 40 mg 03.30.2022 [address] [place], [place] [phone]

## 2020-11-26 ENCOUNTER — Telehealth: Payer: Self-pay | Admitting: Pharmacist

## 2020-11-26 DIAGNOSIS — H26491 Other secondary cataract, right eye: Secondary | ICD-10-CM | POA: Diagnosis not present

## 2020-11-26 DIAGNOSIS — H26493 Other secondary cataract, bilateral: Secondary | ICD-10-CM | POA: Diagnosis not present

## 2020-11-26 NOTE — Chronic Care Management (AMB) (Signed)
    Chronic Care Management Pharmacy Assistant   Name: Evanie Buckle  MRN: [id number] DOB: 07/31/1950  Unable to leave appointment reminder due to voicemail box full.   [place] [phone]

## 2020-11-27 ENCOUNTER — Ambulatory Visit (INDEPENDENT_AMBULATORY_CARE_PROVIDER_SITE_OTHER): Payer: PPO | Admitting: Pharmacist

## 2020-11-27 DIAGNOSIS — I10 Essential (primary) hypertension: Secondary | ICD-10-CM | POA: Diagnosis not present

## 2020-11-27 DIAGNOSIS — E782 Mixed hyperlipidemia: Secondary | ICD-10-CM

## 2020-11-27 NOTE — Progress Notes (Signed)
Chronic Care Management Pharmacy Note  12/19/2020 Name:  Patricia Solis MRN:  [id number] DOB:  04-07-1951  Subjective: Patricia Solis is an 70 y.o. year old female who is a primary patient of Nafziger, Tommi Rumps, NP.  The CCM team was consulted for assistance with disease management and care coordination needs.    Engaged with patient by telephone for follow up visit in response to provider referral for pharmacy case management and/or care coordination services.   Consent to Services:  The patient was given information about Chronic Care Management services, agreed to services, and gave verbal consent prior to initiation of services.  Please see initial visit note for detailed documentation.   Patient Care Team: Dorothyann Peng, NP as PCP - General (Family Medicine) Viona Gilmore, [facility] as Pharmacist (Pharmacist)  Recent office visits: 09/29/20 Alysia Penna, MD: Patient presented for heartburn. Prescribed famotidine 20 mg at bedtime.   05/23/20 Ofilia Neas, LPN: Patient presented for medicare annual wellness visit.  05/22/20 Dorothyann Peng, NP: Patient presented for annual exam. Switched simvastatin to rosuvastatin 40 mg daily.  Recent consult visits: 09/05/20 Rutherford Guys, MD (eye care): Patient presented for eye exam.  06/30/20 Eunice Blase, MD (ortho): Patient presented for follow up for joint pain.  02/19/20 Dian Queen (OBGYN): Patient presented for routine exam. Unable to access notes.   Hospital visits: None in previous 6 months  Objective:  Lab Results  Component Value Date   CREATININE 0.64 05/22/2020   BUN 21 05/22/2020   GFR 88.95 07/24/2019   GFRNONAA 91 05/22/2020   GFRAA 106 05/22/2020   NA 140 05/22/2020   K 4.4 05/22/2020   CALCIUM 9.7 05/22/2020   CO2 28 05/22/2020   GLUCOSE 89 05/22/2020    Lab Results  Component Value Date/Time   HGBA1C 5.4 05/22/2020 08:26 AM   GFR 88.95 07/24/2019 07:44 AM   GFR 103.32 05/11/2019 09:48 AM    Last  diabetic Eye exam:  Lab Results  Component Value Date/Time   HMDIABEYEEXA No Retinopathy 03/04/2018 12:00 AM    Last diabetic Foot exam: No results found for: HMDIABFOOTEX   Lab Results  Component Value Date   CHOL 225 (H) 05/22/2020   HDL 74 05/22/2020   LDLCALC 131 (H) 05/22/2020   LDLDIRECT 140.4 09/02/2006   TRIG 95 05/22/2020   CHOLHDL 3.0 05/22/2020    Hepatic Function Latest Ref Rng & Units 05/22/2020 07/09/2019 05/11/2019  Total Protein 6.1 - 8.1 g/dL 7.0 6.4(L) 6.9  Albumin 3.5 - 5.0 g/dL - 3.7 4.5  AST 10 - 35 U/L 15 137(H) 15  ALT 6 - 29 U/L 15 84(H) 19  Alk Phosphatase 38 - 126 U/L - 79 71  Total Bilirubin 0.2 - 1.2 mg/dL 0.5 0.6 0.5  Bilirubin, Direct 0.0 - 0.2 mg/dL - 0.1 -    Lab Results  Component Value Date/Time   TSH 2.92 05/22/2020 08:26 AM   TSH 1.23 10/04/2019 11:09 AM   FREET4 0.80 12/29/2010 04:56 PM    CBC Latest Ref Rng & Units 05/22/2020 07/09/2019 07/09/2019  WBC 3.8 - 10.8 Thousand/uL 5.8 - 11.0(H)  Hemoglobin 11.7 - 15.5 g/dL 14.3 13.9 13.8  Hematocrit 35.0 - 45.0 % 43.0 41.0 41.8  Platelets 140 - 400 Thousand/uL 304 - 289    Lab Results  Component Value Date/Time   VD25OH 47 05/22/2020 08:26 AM   VD25OH 26 (L) 03/26/2020 09:51 AM   VD25OH 43.86 04/27/2016 07:43 AM    Clinical ASCVD: No  The 10-year  ASCVD risk score Mikey Bussing DC Jr., et al., 2013) is: 20.2%   Values used to calculate the score:     Age: 31 years     Sex: Female     Is Non-Hispanic African American: No     Diabetic: Yes     Tobacco smoker: No     Systolic Blood Pressure: 341 mmHg     Is BP treated: Yes     HDL Cholesterol: 74 mg/dL     Total Cholesterol: 225 mg/dL    Depression screen [facility] 2/9 05/23/2020 05/22/2020 05/02/2018  Decreased Interest 0 0 0  Down, Depressed, Hopeless 0 0 0  PHQ - 2 Score 0 0 0  Altered sleeping 0 - -  Tired, decreased energy 0 - -  Change in appetite 0 - -  Feeling bad or failure about yourself  0 - -  Trouble concentrating 0 - -   Moving slowly or fidgety/restless 0 - -  Suicidal thoughts 0 - -  PHQ-9 Score 0 - -  Difficult doing work/chores Not difficult at all - -      Social History   Tobacco Use  Smoking Status Never Smoker  Smokeless Tobacco Never Used   BP Readings from Last 3 Encounters:  09/29/20 128/70  05/22/20 132/80  10/23/19 130/72   Pulse Readings from Last 3 Encounters:  09/29/20 82  05/22/20 77  07/09/19 80   Wt Readings from Last 3 Encounters:  09/29/20 153 lb (69.4 kg)  05/23/20 154 lb (69.9 kg)  05/22/20 154 lb (69.9 kg)   BMI Readings from Last 3 Encounters:  09/29/20 28.44 kg/m  05/23/20 28.63 kg/m  05/22/20 28.63 kg/m    Assessment/Interventions: Review of patient past medical history, allergies, medications, health status, including review of consultants reports, laboratory and other test data, was performed as part of comprehensive evaluation and provision of chronic care management services.   SDOH:  (Social Determinants of Health) assessments and interventions performed: No  SDOH Screenings   Alcohol Screen: Low Risk   . Last Alcohol Screening Score (AUDIT): 1  Depression (PHQ2-9): Low Risk   . PHQ-2 Score: 0  Financial Resource Strain: Low Risk   . Difficulty of Paying Living Expenses: Not hard at all  Food Insecurity: No Food Insecurity  . Worried About Charity fundraiser in the Last Year: Never true  . Ran Out of Food in the Last Year: Never true  Housing: Low Risk   . Last Housing Risk Score: 0  Physical Activity: Sufficiently Active  . Days of Exercise per Week: 4 days  . Minutes of Exercise per Session: 40 min  Social Connections: Moderately Integrated  . Frequency of Communication with Friends and Family: Once a week  . Frequency of Social Gatherings with Friends and Family: More than three times a week  . Attends Religious Services: More than 4 times per year  . Active Member of Clubs or Organizations: No  . Attends Archivist  Meetings: Never  . Marital Status: Married  Stress: No Stress Concern Present  . Feeling of Stress : Not at all  Tobacco Use: Low Risk   . Smoking Tobacco Use: Never Smoker  . Smokeless Tobacco Use: Never Used  Transportation Needs: No Transportation Needs  . Lack of Transportation (Medical): No  . Lack of Transportation (Non-Medical): No    CCM Care Plan  Allergies  Allergen Reactions  . Ambien Cr [Zolpidem] Other (See Comments)    Hallucinations  . Cozaar [  Losartan] Cough  . Erythromycin     REACTION: rash  . Hydrocodone Other (See Comments)    Hallucinations  . Lisinopril Cough  . Penicillins     REACTION: rash  . Clindamycin/Lincomycin Rash    Medications Reviewed Today    Reviewed by Laurey Morale, MD (Physician) on 09/29/20 at 1427  Med List Status: <None>  Medication Order Taking? Sig Documenting Provider Last Dose Status Informant  aspirin 81 MG EC tablet [id number] Yes Take 81 mg by mouth daily. [provider] Taking Active Self  B Complex-C (B-COMPLEX WITH VITAMIN C) tablet [id number] Yes Take 1 tablet by mouth daily. [provider] Taking Active Self  calcium carbonate (OS-CAL) 600 MG TABS tablet [id number] Yes Take 1 tablet (600 mg total) by mouth 2 (two) times daily with a meal. Nafziger, Tommi Rumps, NP Taking Active Self  Cholecalciferol (VITAMIN D3 PO) [id number] Yes Take by mouth. [provider] Taking Active            Med Note Marlyne Beards   Mon Jun 30, 2020  9:15 AM) 5,000 iu qd  Coenzyme Q10 200 MG TABS [id number] Yes Take 200 mg by mouth daily. [provider] Taking Active   CRANBERRY PO [id number] Yes Take 2 tablets by mouth at bedtime. [provider] Taking Active   GLUCOSAMINE PO [id number] Yes Take 1 tablet by mouth 2 (two) times daily. [provider] Taking Active Self  hydrochlorothiazide (HYDRODIURIL) 25 MG tablet [id number] Yes Take 1 tablet (25 mg total) by mouth daily. Nafziger, Tommi Rumps, NP Taking  Active   hydrocortisone 2.5 % ointment [id number] Yes Apply topically 2 (two) times daily. [provider] Taking Active            Med Note Edison Pace, TERRI M   Wed Mar 26, 2020  9:26 AM) Prn skin rash  levothyroxine (EUTHYROX) 75 MCG tablet [id number] Yes Take 1 tablet (75 mcg total) by mouth daily before breakfast. Dorothyann Peng, NP Taking Active   magnesium oxide (MAG-OX) 400 MG tablet [id number] Yes Take 400 mg by mouth daily. [provider] Taking Active   omeprazole (PRILOSEC) 20 MG capsule [id number] Yes Take 1 capsule (20 mg total) by mouth daily. Nafziger, Tommi Rumps, NP Taking Active   rosuvastatin (CRESTOR) 40 MG tablet [id number] Yes Take 1 tablet (40 mg total) by mouth daily. Dorothyann Peng, NP Taking Active           Patient Active Problem List   Diagnosis Date Noted  . GERD (gastroesophageal reflux disease) 09/29/2020  . Esophageal stricture 01/26/2011  . Arrhythmia 12/29/2010  . Essential hypertension 07/21/2010  . CONTACT DERMATITIS&OTHER ECZEMA DUE UNSPEC CAUSE 04/07/2010  . Hyperlipidemia 09/11/2007  . Migraine headache 09/11/2007  . Hypothyroidism 01/26/2007    Immunization History  Administered Date(s) Administered  . Fluad Quad(high Dose 65+) 05/11/2019, 05/22/2020  . Influenza Whole 07/26/2005, 04/25/2009  . Influenza, High Dose Seasonal PF 04/27/2016, 04/28/2017, 05/02/2018  . Influenza,inj,quad, With Preservative 04/25/2017  . PFIZER(Purple Top)SARS-COV-2 Vaccination 09/01/2019, 09/26/2019, 04/21/2020  . Pneumococcal Conjugate-13 04/27/2016  . Pneumococcal Polysaccharide-23 04/28/2017  . Td 07/26/2001  . Tdap 09/21/2011  . Zoster Recombinat (Shingrix) 04/20/2017, 08/11/2017, 09/08/2017  . Zoster, Live 07/11/2012    Conditions to be addressed/monitored:  Hypertension, Hyperlipidemia, GERD, Hypothyroidism and Prevention of heart events  Care Plan : [place]  Updates made by Viona Gilmore, [place] since 12/19/2020 12:00 AM     Problem: Problem: Hypertension, Hyperlipidemia, GERD, Hypothyroidism and Prevention of heart  events     Long-Range Goal: Patient-Specific Goal   Start Date: 11/27/2020  Expected End Date: 11/27/2021  This Visit's Progress: On track  Priority: High  Note:   Current Barriers:  . Unable to independently monitor therapeutic efficacy . Unable to achieve control of cholesterol   Pharmacist Clinical Goal(s):  Marland Kitchen Patient will achieve adherence to monitoring guidelines and medication adherence to achieve therapeutic efficacy through collaboration with PharmD and provider.   Interventions: . 1:1 collaboration with Dorothyann Peng, NP regarding development and update of comprehensive plan of care as evidenced by provider attestation and co-signature . Inter-disciplinary care team collaboration (see longitudinal plan of care) . Comprehensive medication review performed; medication list updated in electronic medical record  Hypertension (BP goal <140/90) -Controlled -Current treatment: . Hydrochlorothiazide 25 mg 1 tablet daily -Medications previously tried: none -Current home readings: 122/71, 129/73, 124/67, 131/70, 122/68, 139/71 -Current dietary habits: puts some salt in foods but not on the table (only to season) -Current exercise habits: active with the [facility] and goes every Monday, Wednesday, and Friday -Denies hypotensive/hypertensive symptoms -Educated on Exercise goal of 150 minutes per week; Importance of home blood pressure monitoring; Proper BP monitoring technique; -Counseled to monitor BP at home weekly, document, and provide log at future appointments -Counseled on diet and exercise extensively Recommended to continue current medication  Hyperlipidemia: (LDL goal < 100) -Uncontrolled -Current treatment: . Rosuvastatin 40 mg 1 tablet daily -Medications previously tried: simvastatin (ineffective) -Current dietary patterns: did not discuss -Current exercise habits: active with the  [employer] and goes every Monday, Wednesday, and Friday -Educated on Cholesterol goals;  Importance of limiting foods high in cholesterol; Exercise goal of 150 minutes per week; -Counseled on diet and exercise extensively Recommended to continue current medication Recommended repeat lipid panel  GERD (Goal: minimize symptoms) -Controlled -Current treatment   Omeprazole 20 mg 1 capsule daily  Famotidine 20 mg 1 tablet at bedtime -Medications previously tried: none  -Counseled on Non-pharmacologic management of symptoms such as elevating the head of your bed, avoiding eating 2-3 hours before bed, avoiding triggering foods such as acidic, spicy, or fatty foods, eating smaller meals, and wearing clothes that are loose around the waist  Health Maintenance -Vaccine gaps: none -Current therapy:   Aspirin 81 mg 1 tablet daily  Vitamin B complex w/vitamin C 1 tablet daily  Calcium carbonate 600 mg 1 tablet twice daily  Vitamin D3 5000 units 1 tablet daily  Coenzyme Q10 30 mg 1 capsule daily  Cranberry daily  Glucosamine 1 tablet twice daily  Hydrocortisone 2.5% ointment apply twice daily  Magnesium oxide 400 mg 1 tablet daily -Educated on Cost vs benefit of each product must be carefully weighed by individual consumer -Patient is satisfied with current therapy and denies issues -Recommended to continue current medication  Patient Goals/Self-Care Activities . Patient will:  - take medications as prescribed check blood pressure weekly, document, and provide at future appointments target a minimum of 150 minutes of moderate intensity exercise weekly  Follow Up Plan: Telephone follow up appointment with care management team member scheduled for: 6 months      Medication Assistance: None required.  Patient affirms current coverage meets needs.  Patient's preferred pharmacy is:  [facility] [address] ([address]), [place] [facility] [place] [address] [place]  ([place]) [place] [address] Phone: [phone] Fax: [phone]  Uses pill box? No - cabinet- morning on the left and nighttime on the right Pt endorses 99% compliance  We discussed:  Current pharmacy is preferred with insurance plan and patient is satisfied with pharmacy services Patient decided to: Continue current medication management strategy  Care Plan and Follow Up Patient Decision:  Patient agrees to Care Plan and Follow-up.  Plan: Telephone follow up appointment with care management team member scheduled for:  6 months  Jeni Salles, PharmD [place] Pharmacist [place] [facility] [place] [phone]

## 2020-12-03 DIAGNOSIS — H26492 Other secondary cataract, left eye: Secondary | ICD-10-CM | POA: Diagnosis not present

## 2020-12-16 DIAGNOSIS — H43812 Vitreous degeneration, left eye: Secondary | ICD-10-CM | POA: Diagnosis not present

## 2020-12-16 DIAGNOSIS — H35033 Hypertensive retinopathy, bilateral: Secondary | ICD-10-CM | POA: Diagnosis not present

## 2020-12-16 DIAGNOSIS — H43391 Other vitreous opacities, right eye: Secondary | ICD-10-CM | POA: Diagnosis not present

## 2020-12-16 DIAGNOSIS — H43821 Vitreomacular adhesion, right eye: Secondary | ICD-10-CM | POA: Diagnosis not present

## 2020-12-19 NOTE — Patient Instructions (Addendum)
Hi Patricia Solis,  It was great to get to speak with you again! Below is a summary of some of the topics we discussed.   Please reach out to me if you have any questions or need anything before our follow up!  Best, Maddie  Jeni Salles, PharmD, [place] [facility]  Visit Information  Goals Addressed   None    Patient Care Plan: CCM Pharmacy Care Plan    Problem Identified: Problem: Hypertension, Hyperlipidemia, GERD, Hypothyroidism and Prevention of heart events     Long-Range Goal: Patient-Specific Goal   Start Date: 11/27/2020  Expected End Date: 11/27/2021  This Visit's Progress: On track  Priority: High  Note:   Current Barriers:  . Unable to independently monitor therapeutic efficacy . Unable to achieve control of cholesterol   Pharmacist Clinical Goal(s):  Marland Kitchen Patient will achieve adherence to monitoring guidelines and medication adherence to achieve therapeutic efficacy through collaboration with PharmD and provider.   Interventions: . 1:1 collaboration with Dorothyann Peng, NP regarding development and update of comprehensive plan of care as evidenced by provider attestation and co-signature . Inter-disciplinary care team collaboration (see longitudinal plan of care) . Comprehensive medication review performed; medication list updated in electronic medical record  Hypertension (BP goal <140/90) -Controlled -Current treatment: . Hydrochlorothiazide 25 mg 1 tablet daily -Medications previously tried: none -Current home readings: 122/71, 129/73, 124/67, 131/70, 122/68, 139/71 -Current dietary habits: puts some salt in foods but not on the table (only to season) -Current exercise habits: active with the [facility] and goes every Monday, Wednesday, and Friday -Denies hypotensive/hypertensive symptoms -Educated on Exercise goal of 150 minutes per week; Importance of home blood pressure monitoring; Proper BP monitoring  technique; -Counseled to monitor BP at home weekly, document, and provide log at future appointments -Counseled on diet and exercise extensively Recommended to continue current medication  Hyperlipidemia: (LDL goal < 100) -Uncontrolled -Current treatment: . Rosuvastatin 40 mg 1 tablet daily -Medications previously tried: simvastatin (ineffective) -Current dietary patterns: did not discuss -Current exercise habits: active with the [employer] and goes every Monday, Wednesday, and Friday -Educated on Cholesterol goals;  Importance of limiting foods high in cholesterol; Exercise goal of 150 minutes per week; -Counseled on diet and exercise extensively Recommended to continue current medication Recommended repeat lipid panel  GERD (Goal: minimize symptoms) -Controlled -Current treatment   Omeprazole 20 mg 1 capsule daily  Famotidine 20 mg 1 tablet at bedtime -Medications previously tried: none  -Counseled on Non-pharmacologic management of symptoms such as elevating the head of your bed, avoiding eating 2-3 hours before bed, avoiding triggering foods such as acidic, spicy, or fatty foods, eating smaller meals, and wearing clothes that are loose around the waist  Health Maintenance -Vaccine gaps: none -Current therapy:   Aspirin 81 mg 1 tablet daily  Vitamin B complex w/vitamin C 1 tablet daily  Calcium carbonate 600 mg 1 tablet twice daily  Vitamin D3 5000 units 1 tablet daily  Coenzyme Q10 30 mg 1 capsule daily  Cranberry daily  Glucosamine 1 tablet twice daily  Hydrocortisone 2.5% ointment apply twice daily  Magnesium oxide 400 mg 1 tablet daily -Educated on Cost vs benefit of each product must be carefully weighed by individual consumer -Patient is satisfied with current therapy and denies issues -Recommended to continue current medication  Patient Goals/Self-Care Activities . Patient will:  - take medications as prescribed check blood pressure weekly, document, and  provide at future appointments target a minimum of 150 minutes  of moderate intensity exercise weekly  Follow Up Plan: Telephone follow up appointment with care management team member scheduled for: 6 months       Patient verbalizes understanding of instructions provided today and agrees to view in [place].  Telephone follow up appointment with pharmacy team member scheduled for: 6 months  Viona Gilmore, [facility]  High Cholesterol  High cholesterol is a condition in which the blood has high levels of a white, waxy substance similar to fat (cholesterol). The liver makes all the cholesterol that the body needs. The human body needs small amounts of cholesterol to help build cells. A person gets extra or excess cholesterol from the food that he or she eats. The blood carries cholesterol from the liver to the rest of the body. If you have high cholesterol, deposits (plaques) may build up on the walls of your arteries. Arteries are the blood vessels that carry blood away from your heart. These plaques make the arteries narrow and stiff. Cholesterol plaques increase your risk for heart attack and stroke. Work with your health care provider to keep your cholesterol levels in a healthy range. What increases the risk? The following factors may make you more likely to develop this condition:  Eating foods that are high in animal fat (saturated fat) or cholesterol.  Being overweight.  Not getting enough exercise.  A family history of high cholesterol (familial hypercholesterolemia).  Use of tobacco products.  Having diabetes. What are the signs or symptoms? There are no symptoms of this condition. How is this diagnosed? This condition may be diagnosed based on the results of a blood test.  If you are older than 70 years of age, your health care provider may check your cholesterol levels every 4-6 years.  You may be checked more often if you have high cholesterol or other risk factors for  heart disease. The blood test for cholesterol measures:  "Bad" cholesterol, or LDL cholesterol. This is the main type of cholesterol that causes heart disease. The desired level is less than 100 mg/dL.  "Good" cholesterol, or HDL cholesterol. HDL helps protect against heart disease by cleaning the arteries and carrying the LDL to the liver for processing. The desired level for HDL is 60 mg/dL or higher.  Triglycerides. These are fats that your body can store or burn for energy. The desired level is less than 150 mg/dL.  Total cholesterol. This measures the total amount of cholesterol in your blood and includes LDL, HDL, and triglycerides. The desired level is less than 200 mg/dL. How is this treated? This condition may be treated with:  Diet changes. You may be asked to eat foods that have more fiber and less saturated fats or added sugar.  Lifestyle changes. These may include regular exercise, maintaining a healthy weight, and quitting use of tobacco products.  Medicines. These are given when diet and lifestyle changes have not worked. You may be prescribed a statin medicine to help lower your cholesterol levels. Follow these instructions at home: Eating and drinking  Eat a healthy, balanced diet. This diet includes: ? Daily servings of a variety of fresh, frozen, or canned fruits and vegetables. ? Daily servings of whole grain foods that are rich in fiber. ? Foods that are low in saturated fats and trans fats. These include poultry and fish without skin, lean cuts of meat, and low-fat dairy products. ? A variety of fish, especially oily fish that contain omega-3 fatty acids. Aim to eat fish at least 2  times a week.  Avoid foods and drinks that have added sugar.  Use healthy cooking methods, such as roasting, grilling, broiling, baking, poaching, steaming, and stir-frying. Do not fry your food except for stir-frying.   Lifestyle  Get regular exercise. Aim to exercise for a total of  150 minutes a week. Increase your activity level by doing activities such as gardening, walking, and taking the stairs.  Do not use any products that contain nicotine or tobacco, such as cigarettes, e-cigarettes, and chewing tobacco. If you need help quitting, ask your health care provider.   General instructions  Take over-the-counter and prescription medicines only as told by your health care provider.  Keep all follow-up visits as told by your health care provider. This is important. Where to find more information  American Heart Association: www.heart.org  National Heart, Lung, and Blood Institute: https://wilson-eaton.com/ Contact a health care provider if:  You have trouble achieving or maintaining a healthy diet or weight.  You are starting an exercise program.  You are unable to stop smoking. Get help right away if:  You have chest pain.  You have trouble breathing.  You have any symptoms of a stroke. "BE FAST" is an easy way to remember the main warning signs of a stroke: ? B - Balance. Signs are dizziness, sudden trouble walking, or loss of balance. ? E - Eyes. Signs are trouble seeing or a sudden change in vision. ? F - Face. Signs are sudden weakness or numbness of the face, or the face or eyelid drooping on one side. ? A - Arms. Signs are weakness or numbness in an arm. This happens suddenly and usually on one side of the body. ? S - Speech. Signs are sudden trouble speaking, slurred speech, or trouble understanding what people say. ? T - Time. Time to call emergency services. Write down what time symptoms started.  You have other signs of a stroke, such as: ? A sudden, severe headache with no known cause. ? Nausea or vomiting. ? Seizure. These symptoms may represent a serious problem that is an emergency. Do not wait to see if the symptoms will go away. Get medical help right away. Call your local emergency services (911 in the [place] Do not drive yourself to the  hospital. Summary  Cholesterol plaques increase your risk for heart attack and stroke. Work with your health care provider to keep your cholesterol levels in a healthy range.  Eat a healthy, balanced diet, get regular exercise, and maintain a healthy weight.  Do not use any products that contain nicotine or tobacco, such as cigarettes, e-cigarettes, and chewing tobacco.  Get help right away if you have any symptoms of a stroke. This information is not intended to replace advice given to you by your health care provider. Make sure you discuss any questions you have with your health care provider. Document Revised: 06/11/2019 Document Reviewed: 06/11/2019 Elsevier Patient Education  2021 [employer].

## 2020-12-30 ENCOUNTER — Other Ambulatory Visit: Payer: Self-pay | Admitting: Obstetrics and Gynecology

## 2020-12-30 DIAGNOSIS — Z1231 Encounter for screening mammogram for malignant neoplasm of breast: Secondary | ICD-10-CM

## 2021-01-22 ENCOUNTER — Other Ambulatory Visit: Payer: Self-pay | Admitting: Adult Health

## 2021-01-22 DIAGNOSIS — Z760 Encounter for issue of repeat prescription: Secondary | ICD-10-CM

## 2021-01-22 DIAGNOSIS — E039 Hypothyroidism, unspecified: Secondary | ICD-10-CM

## 2021-02-09 DIAGNOSIS — N760 Acute vaginitis: Secondary | ICD-10-CM | POA: Diagnosis not present

## 2021-02-09 DIAGNOSIS — M545 Low back pain, unspecified: Secondary | ICD-10-CM | POA: Diagnosis not present

## 2021-02-16 ENCOUNTER — Other Ambulatory Visit: Payer: Self-pay | Admitting: Adult Health

## 2021-02-23 ENCOUNTER — Ambulatory Visit
Admission: RE | Admit: 2021-02-23 | Discharge: 2021-02-23 | Disposition: A | Discharge status: Home / Self Care | Payer: PPO | Source: Ambulatory Visit | Attending: Obstetrics and Gynecology | Admitting: Obstetrics and Gynecology

## 2021-02-23 ENCOUNTER — Other Ambulatory Visit: Payer: Self-pay

## 2021-02-23 DIAGNOSIS — Z1231 Encounter for screening mammogram for malignant neoplasm of breast: Secondary | ICD-10-CM

## 2021-02-26 DIAGNOSIS — Z01419 Encounter for gynecological examination (general) (routine) without abnormal findings: Secondary | ICD-10-CM | POA: Diagnosis not present

## 2021-02-26 DIAGNOSIS — Z6826 Body mass index (BMI) 26.0-26.9, adult: Secondary | ICD-10-CM | POA: Diagnosis not present

## 2021-03-04 ENCOUNTER — Telehealth: Payer: Self-pay | Admitting: Pharmacist

## 2021-03-04 NOTE — Chronic Care Management (AMB) (Signed)
Chronic Care Management Pharmacy Assistant   Name: Patricia Solis  MRN: [id number] DOB: [birth year]  Reason for Encounter: Disease State / Hypertension Assessment Call   Conditions to be addressed/monitored: HTN  Recent office visits:  None  Recent consult visits:  02-23-2021 Patient presented to [place] for Mammogram  12-16-2020 Princess Bruins (Ophthalmology) - Patient presented for Hypertensive retinopathy and other concerns. No medication changes noted.  12-03-2020 Hyman Hopes MD (Ophthalmology) - Patient presented for Post-op exam  No medication changes noted.  12-02-2020 Hyman Hopes MD (Ophthalmology) - No visit details available. Hospital visits:  None in previous 6 months  Medications: Outpatient Encounter Medications as of 03/04/2021  Medication Sig Note   aspirin 81 MG EC tablet Take 81 mg by mouth daily.    B Complex-C (B-COMPLEX WITH VITAMIN C) tablet Take 1 tablet by mouth daily.    calcium carbonate (OS-CAL) 600 MG TABS tablet Take 1 tablet (600 mg total) by mouth 2 (two) times daily with a meal.    Cholecalciferol (VITAMIN D3 PO) Take by mouth. 06/30/2020: 5,000 iu qd   Coenzyme Q10 200 MG TABS Take 200 mg by mouth daily.    CRANBERRY PO Take 2 tablets by mouth at bedtime.    EUTHYROX 75 MCG tablet TAKE 1 TABLET BY MOUTH ONCE DAILY BEFORE BREAKFAST    famotidine (PEPCID) 40 MG tablet TAKE 1 TABLET BY MOUTH AT BEDTIME    GLUCOSAMINE PO Take 1 tablet by mouth 2 (two) times daily.    hydrochlorothiazide (HYDRODIURIL) 25 MG tablet Take 1 tablet (25 mg total) by mouth daily.    hydrocortisone 2.5 % ointment Apply topically 2 (two) times daily. 03/26/2020: Prn skin rash   magnesium oxide (MAG-OX) 400 MG tablet Take 400 mg by mouth daily.    omeprazole (PRILOSEC) 20 MG capsule Take 1 capsule by mouth once daily    rosuvastatin (CRESTOR) 40 MG tablet Take 1 tablet (40 mg total) by mouth daily.    No facility-administered encounter medications  on file as of 03/04/2021.  Reviewed chart prior to disease state call. Spoke with patient regarding BP  Recent Office Vitals: BP Readings from Last 3 Encounters:  09/29/20 128/70  05/22/20 132/80  10/23/19 130/72   Pulse Readings from Last 3 Encounters:  09/29/20 82  05/22/20 77  07/09/19 80    Wt Readings from Last 3 Encounters:  09/29/20 153 lb (69.4 kg)  05/23/20 154 lb (69.9 kg)  05/22/20 154 lb (69.9 kg)     Kidney Function Lab Results  Component Value Date/Time   CREATININE 0.64 05/22/2020 08:26 AM   CREATININE 0.66 07/24/2019 07:44 AM   CREATININE 0.70 07/09/2019 03:50 AM   GFR 88.95 07/24/2019 07:44 AM   GFRNONAA 91 05/22/2020 08:26 AM   GFRAA 106 05/22/2020 08:26 AM    BMP Latest Ref Rng & Units 05/22/2020 07/24/2019 07/09/2019  Glucose 65 - 99 mg/dL 89 92 103(H)  BUN 7 - 25 mg/dL 21 27(H) 22  Creatinine 0.50 - 0.99 mg/dL 0.64 0.66 0.70  BUN/Creat Ratio 6 - 22 (calc) NOT APPLICABLE - -  Sodium A999333 - 146 mmol/L 140 141 139  Potassium 3.5 - 5.3 mmol/L 4.4 3.6 2.9(L)  Chloride 98 - 110 mmol/L 99 103 100  CO2 20 - 32 mmol/L 28 29 -  Calcium 8.6 - 10.4 mg/dL 9.7 9.5 -    Current antihypertensive regimen:  Hydrochlorothiazide 25 mg 1 tablet daily   Care Gaps: COVID Booster #4 [employer]) -  Overdue Flu Vaccine - Overdue AWV- MSG sent to Ramond Craver CMA to schedule. CCM Call - Scheduled 11-10-2 at 3 pm  Star Rating Drugs: Rosuvastatin 40 mg - Last filled 01-17-2021 90 [facility] at [facility]  Notes: Attempt to reach pt on 03-04-21 mailbox full 2nd attempt to reach on 03-06-2021 3rd attempt to reach on 03-12-2021  [place] Pharmacist Assistant [phone]

## 2021-03-05 DIAGNOSIS — L299 Pruritus, unspecified: Secondary | ICD-10-CM | POA: Diagnosis not present

## 2021-03-05 DIAGNOSIS — L578 Other skin changes due to chronic exposure to nonionizing radiation: Secondary | ICD-10-CM | POA: Diagnosis not present

## 2021-03-05 DIAGNOSIS — H01119 Allergic dermatitis of unspecified eye, unspecified eyelid: Secondary | ICD-10-CM | POA: Diagnosis not present

## 2021-03-05 DIAGNOSIS — L570 Actinic keratosis: Secondary | ICD-10-CM | POA: Diagnosis not present

## 2021-03-05 DIAGNOSIS — L821 Other seborrheic keratosis: Secondary | ICD-10-CM | POA: Diagnosis not present

## 2021-03-05 DIAGNOSIS — L814 Other melanin hyperpigmentation: Secondary | ICD-10-CM | POA: Diagnosis not present

## 2021-03-05 DIAGNOSIS — Z85828 Personal history of other malignant neoplasm of skin: Secondary | ICD-10-CM | POA: Diagnosis not present

## 2021-03-05 DIAGNOSIS — D1801 Hemangioma of skin and subcutaneous tissue: Secondary | ICD-10-CM | POA: Diagnosis not present

## 2021-03-05 DIAGNOSIS — D225 Melanocytic nevi of trunk: Secondary | ICD-10-CM | POA: Diagnosis not present

## 2021-03-05 DIAGNOSIS — D2371 Other benign neoplasm of skin of right lower limb, including hip: Secondary | ICD-10-CM | POA: Diagnosis not present

## 2021-03-22 ENCOUNTER — Other Ambulatory Visit: Payer: Self-pay | Admitting: Adult Health

## 2021-04-01 ENCOUNTER — Telehealth: Payer: Self-pay | Admitting: Adult Health

## 2021-04-01 NOTE — Telephone Encounter (Signed)
Left message for patient to call back and schedule Medicare Annual Wellness Visit (AWV) either virtually or in office. Left  my [name] number [phone]   Last AWV ;05/23/20 please schedule at anytime with [facility] Nurse Health Advisor 1 or 2   This should be a 45 minute visit.   [facility] can do awv calendar year

## 2021-04-08 ENCOUNTER — Other Ambulatory Visit: Payer: Self-pay

## 2021-04-08 ENCOUNTER — Ambulatory Visit (INDEPENDENT_AMBULATORY_CARE_PROVIDER_SITE_OTHER): Payer: PPO

## 2021-04-08 DIAGNOSIS — Z0000 Encounter for general adult medical examination without abnormal findings: Secondary | ICD-10-CM

## 2021-04-08 NOTE — Progress Notes (Signed)
Virtual Visit via Telephone Note  I connected with  Craige Cotta on 04/08/21 at  3:15 PM EDT by telephone and verified that I am speaking with the correct person using two identifiers.  Medicare Annual Wellness visit completed telephonically due to Covid-19 pandemic.   Persons participating in this call: This Health Coach and this patient.   Location: Patient: Home Provider: Office   I discussed the limitations, risks, security and privacy concerns of performing an evaluation and management service by telephone and the availability of in person appointments. The patient expressed understanding and agreed to proceed.  Unable to perform video visit due to video visit attempted and failed and/or patient does not have video capability.   Some vital signs may be absent or patient reported.   Willette Brace, LPN   Subjective:   Dannell Deryke is a 70 y.o. female who presents for Medicare Annual (Subsequent) preventive examination.  Review of Systems     Cardiac Risk Factors include: advanced age (>1mn, >>63women);dyslipidemia     Objective:    There were no vitals filed for this visit. There is no height or weight on file to calculate BMI.  Advanced Directives 04/08/2021 05/23/2020 11/13/2019 07/09/2019 04/28/2017  Does Patient Have a Medical Advance Directive? Yes Yes Yes No Yes  Type of [place]amed[place]of [place] will [place] will - -  Does patient want to make changes to medical advance directive? - No - Patient declined No - Guardian declined - -  Copy of HBoydin Chart? No - copy requested No - copy requested - - -    Current Medications (verified) Outpatient Encounter Medications as of 04/08/2021  Medication Sig   aspirin 81 MG EC tablet Take 81 mg by mouth daily.   B Complex-C (B-COMPLEX WITH VITAMIN C) tablet Take 1 tablet by mouth daily.   calcium  carbonate (OS-CAL) 600 MG TABS tablet Take 1 tablet (600 mg total) by mouth 2 (two) times daily with a meal.   Cholecalciferol (VITAMIN D3 PO) Take by mouth.   Coenzyme Q10 200 MG TABS Take 200 mg by mouth daily.   CRANBERRY PO Take 2 tablets by mouth at bedtime.   EUTHYROX 75 MCG tablet TAKE 1 TABLET BY MOUTH ONCE DAILY BEFORE BREAKFAST   famotidine (PEPCID) 40 MG tablet TAKE 1 TABLET BY MOUTH AT BEDTIME   GLUCOSAMINE PO Take 1 tablet by mouth 2 (two) times daily.   hydrochlorothiazide (HYDRODIURIL) 25 MG tablet Take 1 tablet (25 mg total) by mouth daily.   hydrocortisone 2.5 % ointment Apply topically 2 (two) times daily.   omeprazole (PRILOSEC) 20 MG capsule Take 1 capsule by mouth once daily   rosuvastatin (CRESTOR) 40 MG tablet Take 1 tablet (40 mg total) by mouth daily.   magnesium oxide (MAG-OX) 400 MG tablet Take 400 mg by mouth daily. (Patient not taking: Reported on 04/08/2021)   PFIZER-BIONT COVID-19 VAC-TRIS SUSP injection    No facility-administered encounter medications on file as of 04/08/2021.    Allergies (verified) Ambien cr [zolpidem], Cozaar [losartan], Erythromycin, Hydrocodone, Lisinopril, Penicillins, and Clindamycin/lincomycin   History: Past Medical History:  Diagnosis Date   Basal cell carcinoma 2007   [facility]   [place]    Invasive lobular carcinoma RIGHT breast   Carotid bruit 08/2008   Normal Doppler   DUB (dysfunctional uterine bleeding)    Esophagitis    HTN (hypertension)  Tx started 06/2010   Hyperlipidemia    Hypothyroidism    MHA (microangiopathic hemolytic anemia) (HCC)    Personal history of chemotherapy    2003   Personal history of radiation therapy    2003 right breast   Spinal stenosis    Past Surgical History:  Procedure Laterality Date   BREAST BIOPSY Right    2007 benign   BREAST LUMPECTOMY Right    2003   BTL     CATARACT EXTRACTION, BILATERAL Bilateral 03/26/2016   still blurred vision; Md states this is fine    CERVICAL LAMINECTOMY     Left   FOOT SURGERY     SHOULDER ARTHROSCOPY Right    TUBAL LIGATION  1981   Family History  Problem Relation Age of Onset   Heart attack Father    Coronary artery disease Father    Diabetes Father    Coronary artery disease Mother    Leukemia Mother    Thyroid disease Mother    Multiple sclerosis Sister        Twin   Breast cancer Maternal Aunt    Social History   Socioeconomic History   Marital status: Married    Spouse name: Not on file   Number of children: Not on file   Years of education: Not on file   Highest education level: Not on file  Occupational History   Not on file  Tobacco Use   Smoking status: Never   Smokeless tobacco: Never  Substance and Sexual Activity   Alcohol use: No   Drug use: No   Sexual activity: Not on file  Other Topics Concern   Not on file  Social History Narrative   Retired in March    Married for 65 years    One daughter, lives in [place]    Has four grandchildren      Likes to play with grand kids, go to [employer] and mountains.       Social Determinants of Health   Financial Resource Strain: Low Risk    Difficulty of Paying Living Expenses: Not hard at all  Food Insecurity: No Food Insecurity   Worried About Charity fundraiser in the Last Year: Never true   [place] in the Last Year: Never true  Transportation Needs: No Transportation Needs   Lack of Transportation (Medical): No   Lack of Transportation (Non-Medical): No  Physical Activity: Sufficiently Active   Days of Exercise per Week: 3 days   Minutes of Exercise per Session: 120 min  Stress: No Stress Concern Present   Feeling of Stress : Not at all  Social Connections: Moderately Integrated   Frequency of Communication with Friends and Family: Twice a week   Frequency of Social Gatherings with Friends and Family: More than three times a week   Attends Religious Services: 1 to 4 times per year   Active Member of [employer] or  Organizations: No   Attends Music therapist: Never   Marital Status: Married    Tobacco Counseling Counseling given: Not Answered   Clinical Intake:  Pre-visit preparation completed: Yes  Pain : No/denies pain     BMI - recorded: 28.44 Nutritional Status: BMI 25 -29 Overweight Nutritional Risks: None Diabetes: Yes CBG done?: No Did pt. bring in CBG monitor from home?: No  How often do you need to have someone help you when you read instructions, pamphlets, or other written materials from your doctor or pharmacy?:  1 - Never  Diabetic?Nutrition Risk Assessment:  Has the patient had any N/V/D within the last 2 months?  No  Does the patient have any non-healing wounds?  No  Has the patient had any unintentional weight loss or weight gain?  No   Diabetes:  Is the patient diabetic?  Yes  If diabetic, was a CBG obtained today?  No  Did the patient bring in their glucometer from home?  No  How often do you monitor your CBG's? N/A.   Financial Strains and Diabetes Management:  Are you having any financial strains with the device, your supplies or your medication? No .  Does the patient want to be seen by Chronic Care Management for management of their diabetes?  No  Would the patient like to be referred to a Nutritionist or for Diabetic Management?  No   Diabetic Exams:  Diabetic Eye Exam: Completed 03/04/18 Diabetic Foot Exam: Completed 03/04/18   Interpreter Needed?: No  Information entered by :: Charlott Rakes, LPN   Activities of Daily Living In your present state of health, do you have any difficulty performing the following activities: 04/08/2021 05/23/2020  Hearing? N N  Vision? N Y  Comment - Has blurry vision on occassion due to dry eyes  Difficulty concentrating or making decisions? Tempie Donning  Walking or climbing stairs? Y Y  Comment knees hurt Has some knee pain with knees and left leg pain  Dressing or bathing? N N  Doing errands, shopping? N N   Preparing Food and eating ? N N  Using the Toilet? N N  In the past six months, have you accidently leaked urine? N N  Do you have problems with loss of bowel control? N N  Managing your Medications? N N  Managing your Finances? N N  Housekeeping or managing your Housekeeping? N N  Some recent data might be hidden    Patient Care Team: Dorothyann Peng, NP as PCP - General (Family Medicine) Viona Gilmore, [facility] as Pharmacist (Pharmacist)  Indicate any recent Medical Services you may have received from other than [facility] providers in the past year (date may be approximate).     Assessment:   This is a routine wellness examination for Tajah.  Hearing/Vision screen Hearing Screening - Comments:: Pt denies any hearing issues  Vision Screening - Comments:: Pt follows up with Dr Gershon Crane for annual eye exams   Dietary issues and exercise activities discussed: Current Exercise Habits: Home exercise routine, Type of exercise: walking, Time (Minutes): > 60, Frequency (Times/Week): 3, Weekly Exercise (Minutes/Week): 0   Goals Addressed             This Visit's Progress    Patient Stated       Lose weight        Depression Screen PHQ 2/9 Scores 04/08/2021 05/23/2020 05/22/2020 05/02/2018 04/28/2017 04/28/2017 06/28/2016  PHQ - 2 Score 0 0 0 0 0 0 0  PHQ- 9 Score - 0 - - - - -    Fall Risk Fall Risk  04/08/2021 05/23/2020 05/22/2020 06/19/2019 05/02/2018  Falls in the past year? 0 0 0 1 No  Comment - - - Emmi Telephone Survey: data to providers prior to load -  Number falls in past yr: 0 0 0 1 -  Comment - - - Emmi Telephone Survey Actual Response = 1 -  Injury with Fall? 0 0 0 1 -  Risk for fall due to : Impaired vision No Fall Risks - - -  Follow up Falls prevention discussed Falls evaluation completed;Falls prevention discussed - - -    FALL RISK PREVENTION PERTAINING TO THE HOME:  Any stairs in or around the home? No  If so, are there any without handrails? No  Home free  of loose throw rugs in walkways, pet beds, electrical cords, etc? Yes  Adequate lighting in your home to reduce risk of falls? Yes   ASSISTIVE DEVICES UTILIZED TO PREVENT FALLS:  Life alert? No  Use of a cane, walker or w/c? No  Grab bars in the bathroom? Yes  Shower chair or bench in shower? No  Elevated toilet seat or a handicapped toilet? No   TIMED UP AND GO:  Was the test performed? No .   Cognitive Function:Declined at this time MMSE - Mini Mental State Exam 04/28/2017  Not completed: (No Data)     6CIT Screen 05/23/2020  What Year? 0 points  What month? 0 points  What time? 0 points  Count back from 20 0 points  Months in reverse 0 points  Repeat phrase 0 points  Total Score 0    Immunizations Immunization History  Administered Date(s) Administered   Fluad Quad(high Dose 65+) 05/11/2019, 05/22/2020   Influenza Whole 07/26/2005, 04/25/2009   Influenza, High Dose Seasonal PF 04/27/2016, 04/28/2017, 05/02/2018   Influenza,inj,quad, With Preservative 04/25/2017   PFIZER(Purple Top)SARS-COV-2 Vaccination 09/01/2019, 09/26/2019, 04/21/2020   Pneumococcal Conjugate-13 04/27/2016   Pneumococcal Polysaccharide-23 04/28/2017   Td 07/26/2001   Tdap 09/21/2011   Zoster Recombinat (Shingrix) 04/20/2017, 08/11/2017, 09/08/2017   Zoster, Live 07/11/2012    TDAP status: Up to date  Flu Vaccine status: Due, Education has been provided regarding the importance of this vaccine. Advised may receive this vaccine at local pharmacy or Health Dept. Aware to provide a copy of the vaccination record if obtained from local pharmacy or Health Dept. Verbalized acceptance and understanding.  Pneumococcal vaccine status: Up to date  Covid-19 vaccine status: Completed vaccines  Qualifies for Shingles Vaccine? Yes   Zostavax completed No   Shingrix Completed?: Yes  Screening Tests Health Maintenance  Topic Date Due   COVID-19 Vaccine (4 - Booster for Pfizer series) 08/21/2020    INFLUENZA VACCINE  02/23/2021   TETANUS/TDAP  09/20/2021   COLONOSCOPY (Pts 45-42yr Insurance coverage will need to be confirmed)  08/26/2022   MAMMOGRAM  02/24/2023   DEXA SCAN  Completed   Hepatitis C Screening  Completed   PNA vac Low Risk Adult  Completed   Zoster Vaccines- Shingrix  Completed   HPV VACCINES  Aged Out   FOOT EXAM  Discontinued   HEMOGLOBIN A1C  Discontinued   OPHTHALMOLOGY EXAM  Discontinued   URINE MICROALBUMIN  Discontinued    Health Maintenance  Health Maintenance Due  Topic Date Due   COVID-19 Vaccine (4 - Booster for PVerdonseries) 08/21/2020   INFLUENZA VACCINE  02/23/2021    Colorectal cancer screening: Type of screening: Colonoscopy. Completed 08/26/12. Repeat every 10 years  Mammogram status: Completed 02/23/21. Repeat every year  Bone Density status: Completed 10/26/17. Results reflect: Bone density results: NORMAL. Repeat every 2 years.   Additional Screening:  Hepatitis C Screening:  Completed 04/27/16  Vision Screening: Recommended annual ophthalmology exams for early detection of glaucoma and other disorders of the eye. Is the patient up to date with their annual eye exam?  Yes  Who is the provider or what is the name of the office in which the patient attends annual eye exams? Dr SGershon CraneIf  pt is not established with a provider, would they like to be referred to a provider to establish care? No .   Dental Screening: Recommended annual dental exams for proper oral hygiene  Community Resource Referral / Chronic Care Management: CRR required this visit?  No   CCM required this visit?  No      Plan:     I have personally reviewed and noted the following in the patient's chart:   Medical and social history Use of alcohol, tobacco or illicit drugs  Current medications and supplements including opioid prescriptions.  Functional ability and status Nutritional status Physical activity Advanced directives List of other  physicians Hospitalizations, surgeries, and ER visits in previous 12 months Vitals Screenings to include cognitive, depression, and falls Referrals and appointments  In addition, I have reviewed and discussed with patient certain preventive protocols, quality metrics, and best practice recommendations. A written personalized care plan for preventive services as well as general preventive health recommendations were provided to patient.     Willette Brace, LPN   [id number]   Nurse Notes: None

## 2021-04-08 NOTE — Patient Instructions (Signed)
[name] , Thank you for taking time to come for your Medicare Wellness Visit. I appreciate your ongoing commitment to your health goals. Please review the following plan we discussed and let me know if I can assist you in the future.   Screening recommendations/referrals: Colonoscopy: Done 08/26/12 repeat every 10 years due 08/26/22 Mammogram: Done 02/23/21 repeat every year Bone Density: Done 10/26/17 repeat  Recommended yearly ophthalmology/optometry visit for glaucoma screening and checkup Recommended yearly dental visit for hygiene and checkup  Vaccinations: Influenza vaccine: due Pneumococcal vaccine: completed  Tdap vaccine: Done 09/21/11 repeat  every 10 years  Shingles vaccine: Completed 04/20/17, 08/11/17 & 09/08/17   Covid-19:Completed 2/6, 3/3 & 04/21/18  Advanced directives: Please bring a copy of your health care power of attorney and living will to the office at your convenience.  Conditions/risks identified: lose weight   Next appointment: Follow up in one year for your annual wellness visit    Preventive Care 65 Years and Older, Female Preventive care refers to lifestyle choices and visits with your health care provider that can promote health and wellness. What does preventive care include? A yearly physical exam. This is also called an annual well check. Dental exams once or twice a year. Routine eye exams. Ask your health care provider how often you should have your eyes checked. Personal lifestyle choices, including: Daily care of your teeth and gums. Regular physical activity. Eating a healthy diet. Avoiding tobacco and drug use. Limiting alcohol use. Practicing safe sex. Taking low-dose aspirin every day. Taking vitamin and mineral supplements as recommended by your health care provider. What happens during an annual well check? The services and screenings done by your health care provider during your annual well check will depend on your age, overall health,  lifestyle risk factors, and family history of disease. Counseling  Your health care provider may ask you questions about your: Alcohol use. Tobacco use. Drug use. Emotional well-being. Home and relationship well-being. Sexual activity. Eating habits. History of falls. Memory and ability to understand (cognition). Work and work Statistician. Reproductive health. Screening  You may have the following tests or measurements: Height, weight, and BMI. Blood pressure. Lipid and cholesterol levels. These may be checked every 5 years, or more frequently if you are over 24 years old. Skin check. Lung cancer screening. You may have this screening every year starting at age 30 if you have a 30-pack-year history of smoking and currently smoke or have quit within the past 15 years. Fecal occult blood test (FOBT) of the stool. You may have this test every year starting at age 57. Flexible sigmoidoscopy or colonoscopy. You may have a sigmoidoscopy every 5 years or a colonoscopy every 10 years starting at age 39. Hepatitis C blood test. Hepatitis B blood test. Sexually transmitted disease (STD) testing. Diabetes screening. This is done by checking your blood sugar (glucose) after you have not eaten for a while (fasting). You may have this done every 1-3 years. Bone density scan. This is done to screen for osteoporosis. You may have this done starting at age 27. Mammogram. This may be done every 1-2 years. Talk to your health care provider about how often you should have regular mammograms. Talk with your health care provider about your test results, treatment options, and if necessary, the need for more tests. Vaccines  Your health care provider may recommend certain vaccines, such as: Influenza vaccine. This is recommended every year. Tetanus, diphtheria, and acellular pertussis (Tdap, Td) vaccine. You may  need a Td booster every 10 years. Zoster vaccine. You may need this after age  35. Pneumococcal 13-valent conjugate (PCV13) vaccine. One dose is recommended after age 78. Pneumococcal polysaccharide (PPSV23) vaccine. One dose is recommended after age 72. Talk to your health care provider about which screenings and vaccines you need and how often you need them. This information is not intended to replace advice given to you by your health care provider. Make sure you discuss any questions you have with your health care provider. Document Released: 08/08/2015 Document Revised: 03/31/2016 Document Reviewed: 05/13/2015 Elsevier Interactive Patient Education  2017 [place] Prevention in the Home Falls can cause injuries. They can happen to people of all ages. There are many things you can do to make your home safe and to help prevent falls. What can I do on the outside of my home? Regularly fix the edges of walkways and driveways and fix any cracks. Remove anything that might make you trip as you walk through a door, such as a raised step or threshold. Trim any bushes or trees on the path to your home. Use bright outdoor lighting. Clear any walking paths of anything that might make someone trip, such as rocks or tools. Regularly check to see if handrails are loose or broken. Make sure that both sides of any steps have handrails. Any raised decks and porches should have guardrails on the edges. Have any leaves, snow, or ice cleared regularly. Use sand or salt on walking paths during winter. Clean up any spills in your garage right away. This includes oil or grease spills. What can I do in the bathroom? Use night lights. Install grab bars by the toilet and in the tub and shower. Do not use towel bars as grab bars. Use non-skid mats or decals in the tub or shower. If you need to sit down in the shower, use a plastic, non-slip stool. Keep the floor dry. Clean up any water that spills on the floor as soon as it happens. Remove soap buildup in the tub or shower  regularly. Attach bath mats securely with double-sided non-slip rug tape. Do not have throw rugs and other things on the floor that can make you trip. What can I do in the bedroom? Use night lights. Make sure that you have a light by your bed that is easy to reach. Do not use any sheets or blankets that are too big for your bed. They should not hang down onto the floor. Have a firm chair that has side arms. You can use this for support while you get dressed. Do not have throw rugs and other things on the floor that can make you trip. What can I do in the kitchen? Clean up any spills right away. Avoid walking on wet floors. Keep items that you use a lot in easy-to-reach places. If you need to reach something above you, use a strong step stool that has a grab bar. Keep electrical cords out of the way. Do not use floor polish or wax that makes floors slippery. If you must use wax, use non-skid floor wax. Do not have throw rugs and other things on the floor that can make you trip. What can I do with my stairs? Do not leave any items on the stairs. Make sure that there are handrails on both sides of the stairs and use them. Fix handrails that are broken or loose. Make sure that handrails are as long as the stairways.  Check any carpeting to make sure that it is firmly attached to the stairs. Fix any carpet that is loose or worn. Avoid having throw rugs at the top or bottom of the stairs. If you do have throw rugs, attach them to the floor with carpet tape. Make sure that you have a light switch at the top of the stairs and the bottom of the stairs. If you do not have them, ask someone to add them for you. What else can I do to help prevent falls? Wear shoes that: Do not have high heels. Have rubber bottoms. Are comfortable and fit you well. Are closed at the toe. Do not wear sandals. If you use a stepladder: Make sure that it is fully opened. Do not climb a closed stepladder. Make sure that  both sides of the stepladder are locked into place. Ask someone to hold it for you, if possible. Clearly mark and make sure that you can see: Any grab bars or handrails. First and last steps. Where the edge of each step is. Use tools that help you move around (mobility aids) if they are needed. These include: Canes. Walkers. Scooters. Crutches. Turn on the lights when you go into a dark area. Replace any light bulbs as soon as they burn out. Set up your furniture so you have a clear path. Avoid moving your furniture around. If any of your floors are uneven, fix them. If there are any pets around you, be aware of where they are. Review your medicines with your doctor. Some medicines can make you feel dizzy. This can increase your chance of falling. Ask your doctor what other things that you can do to help prevent falls. This information is not intended to replace advice given to you by your health care provider. Make sure you discuss any questions you have with your health care provider. Document Released: 05/08/2009 Document Revised: 12/18/2015 Document Reviewed: 08/16/2014 Elsevier Interactive Patient Education  2017 [employer].

## 2021-04-20 ENCOUNTER — Telehealth: Payer: Self-pay | Admitting: Pharmacist

## 2021-04-20 ENCOUNTER — Other Ambulatory Visit: Payer: Self-pay | Admitting: Adult Health

## 2021-04-20 NOTE — Chronic Care Management (AMB) (Signed)
    Chronic Care Management Pharmacy Assistant   Name: Patricia Solis  MRN: [id number] DOB: 12/29/1950  Reason for Encounter: Reschedule Appointment with Jeni Salles as she will be at a different office on Thursdays per Thurmond Butts PTM.    Call to advise patient of the above did not reach, left voicemail advised that the appointment on 11-10 had been rescheduled for 07-01-21 and that she may return call to me if that does not work with her schedule so we can find a more convenient date/time.   Medications: Outpatient Encounter Medications as of 04/20/2021  Medication Sig Note   aspirin 81 MG EC tablet Take 81 mg by mouth daily.    B Complex-C (B-COMPLEX WITH VITAMIN C) tablet Take 1 tablet by mouth daily.    calcium carbonate (OS-CAL) 600 MG TABS tablet Take 1 tablet (600 mg total) by mouth 2 (two) times daily with a meal.    Cholecalciferol (VITAMIN D3 PO) Take by mouth. 06/30/2020: 5,000 iu qd   Coenzyme Q10 200 MG TABS Take 200 mg by mouth daily.    CRANBERRY PO Take 2 tablets by mouth at bedtime.    EUTHYROX 75 MCG tablet TAKE 1 TABLET BY MOUTH ONCE DAILY BEFORE BREAKFAST    famotidine (PEPCID) 40 MG tablet TAKE 1 TABLET BY MOUTH AT BEDTIME    GLUCOSAMINE PO Take 1 tablet by mouth 2 (two) times daily.    hydrochlorothiazide (HYDRODIURIL) 25 MG tablet Take 1 tablet (25 mg total) by mouth daily.    hydrocortisone 2.5 % ointment Apply topically 2 (two) times daily. 03/26/2020: Prn skin rash   magnesium oxide (MAG-OX) 400 MG tablet Take 400 mg by mouth daily. (Patient not taking: Reported on 04/08/2021)    omeprazole (PRILOSEC) 20 MG capsule Take 1 capsule by mouth once daily    PFIZER-BIONT COVID-19 VAC-TRIS SUSP injection     rosuvastatin (CRESTOR) 40 MG tablet Take 1 tablet (40 mg total) by mouth daily.    No facility-administered encounter medications on file as of 04/20/2021.    Care Gaps: COVID Booster #4 Therapist, music) - Overdue Flu Vaccine - Overdue AWV- MSG sent to Ramond Craver CMA to schedule. CCM Call - Scheduled 07-01-21  AWV - 04-08-21  Star Rating Drugs: Rosuvastatin 40 mg - Last filled 04-19-2021 90 DS at [facility] Pharmacist Assistant [phone]

## 2021-04-28 ENCOUNTER — Telehealth: Payer: Self-pay | Admitting: Orthopaedic Surgery

## 2021-04-28 NOTE — Telephone Encounter (Signed)
Lm for pt to reschedule with D.rYates

## 2021-04-29 ENCOUNTER — Telehealth: Payer: Self-pay | Admitting: Pharmacist

## 2021-04-29 NOTE — Chronic Care Management (AMB) (Signed)
Chronic Care Management Pharmacy Assistant   Name: Patricia Solis  MRN: [id number] DOB: April 23, 1951  Reason for Encounter: Disease State Hypertension Assessment Call   Conditions to be addressed/monitored: HTN  Recent office visits:  04/08/2021 Willette Brace, LPN - Patient presented for Medicare Annual Wellness Exam. No medication changes.  Recent consult visits:  05/05/21 Marybelle Killings, MD - Patient presented for Chronic pain off left knee and other concerns. No medication changes.   Hospital visits:  None in previous 6 months  Medications: Outpatient Encounter Medications as of 04/29/2021  Medication Sig Note   aspirin 81 MG EC tablet Take 81 mg by mouth daily.    B Complex-C (B-COMPLEX WITH VITAMIN C) tablet Take 1 tablet by mouth daily.    calcium carbonate (OS-CAL) 600 MG TABS tablet Take 1 tablet (600 mg total) by mouth 2 (two) times daily with a meal.    Cholecalciferol (VITAMIN D3 PO) Take by mouth. 06/30/2020: 5,000 iu qd   Coenzyme Q10 200 MG TABS Take 200 mg by mouth daily.    CRANBERRY PO Take 2 tablets by mouth at bedtime.    EUTHYROX 75 MCG tablet TAKE 1 TABLET BY MOUTH ONCE DAILY BEFORE BREAKFAST    famotidine (PEPCID) 40 MG tablet TAKE 1 TABLET BY MOUTH AT BEDTIME    GLUCOSAMINE PO Take 1 tablet by mouth 2 (two) times daily.    hydrochlorothiazide (HYDRODIURIL) 25 MG tablet Take 1 tablet (25 mg total) by mouth daily.    hydrocortisone 2.5 % ointment Apply topically 2 (two) times daily. 03/26/2020: Prn skin rash   magnesium oxide (MAG-OX) 400 MG tablet Take 400 mg by mouth daily. (Patient not taking: Reported on 04/08/2021)    omeprazole (PRILOSEC) 20 MG capsule Take 1 capsule by mouth once daily    PFIZER-BIONT COVID-19 VAC-TRIS SUSP injection     rosuvastatin (CRESTOR) 40 MG tablet Take 1 tablet (40 mg total) by mouth daily.    No facility-administered encounter medications on file as of 04/29/2021.  Reviewed chart prior to disease state call. Spoke with  patient regarding BP  Recent Office Vitals: BP Readings from Last 3 Encounters:  09/29/20 128/70  05/22/20 132/80  10/23/19 130/72   Pulse Readings from Last 3 Encounters:  09/29/20 82  05/22/20 77  07/09/19 80    Wt Readings from Last 3 Encounters:  09/29/20 153 lb (69.4 kg)  05/23/20 154 lb (69.9 kg)  05/22/20 154 lb (69.9 kg)     Kidney Function Lab Results  Component Value Date/Time   CREATININE 0.64 05/22/2020 08:26 AM   CREATININE 0.66 07/24/2019 07:44 AM   CREATININE 0.70 07/09/2019 03:50 AM   GFR 88.95 07/24/2019 07:44 AM   GFRNONAA 91 05/22/2020 08:26 AM   GFRAA 106 05/22/2020 08:26 AM    BMP Latest Ref Rng & Units 05/22/2020 07/24/2019 07/09/2019  Glucose 65 - 99 mg/dL 89 92 103(H)  BUN 7 - 25 mg/dL 21 27(H) 22  Creatinine 0.50 - 0.99 mg/dL 0.64 0.66 0.70  BUN/Creat Ratio 6 - 22 (calc) NOT APPLICABLE - -  Sodium 637 - 146 mmol/L 140 141 139  Potassium 3.5 - 5.3 mmol/L 4.4 3.6 2.9(L)  Chloride 98 - 110 mmol/L 99 103 100  CO2 20 - 32 mmol/L 28 29 -  Calcium 8.6 - 10.4 mg/dL 9.7 9.5 -    Current antihypertensive regimen:  Hydrochlorothiazide 25 mg 1 tablet daily   Care Gaps: COVID Booster #4 Therapist, music) - Overdue Flu Vaccine - Overdue  CCM Call - Scheduled 07-01-21 BP- (09-2020 128/70)  Star Rating Drugs: Rosuvastatin 40 mg - Last filled 04-19-2021 90 DS at [facility]  Notes: 04-29-21 Attempted to reach left vm with patient and with husb 05-01-2021 Attempted to reach 05/11/21 attempted to reach  [place] Pharmacist Assistant [phone]

## 2021-05-05 ENCOUNTER — Ambulatory Visit: Payer: Self-pay

## 2021-05-05 ENCOUNTER — Other Ambulatory Visit: Payer: Self-pay

## 2021-05-05 ENCOUNTER — Ambulatory Visit: Payer: PPO | Admitting: Orthopaedic Surgery

## 2021-05-05 DIAGNOSIS — M79641 Pain in right hand: Secondary | ICD-10-CM

## 2021-05-05 DIAGNOSIS — M542 Cervicalgia: Secondary | ICD-10-CM | POA: Diagnosis not present

## 2021-05-05 DIAGNOSIS — M7050 Other bursitis of knee, unspecified knee: Secondary | ICD-10-CM

## 2021-05-05 DIAGNOSIS — G8929 Other chronic pain: Secondary | ICD-10-CM | POA: Diagnosis not present

## 2021-05-05 DIAGNOSIS — M25562 Pain in left knee: Secondary | ICD-10-CM

## 2021-05-05 DIAGNOSIS — M4722 Other spondylosis with radiculopathy, cervical region: Secondary | ICD-10-CM

## 2021-05-05 DIAGNOSIS — Z981 Arthrodesis status: Secondary | ICD-10-CM | POA: Diagnosis not present

## 2021-05-05 NOTE — Progress Notes (Signed)
Xr cer  

## 2021-05-11 DIAGNOSIS — M4722 Other spondylosis with radiculopathy, cervical region: Secondary | ICD-10-CM | POA: Insufficient documentation

## 2021-05-11 DIAGNOSIS — M7050 Other bursitis of knee, unspecified knee: Secondary | ICD-10-CM | POA: Insufficient documentation

## 2021-05-11 DIAGNOSIS — Z981 Arthrodesis status: Secondary | ICD-10-CM | POA: Insufficient documentation

## 2021-05-11 NOTE — Progress Notes (Signed)
Office Visit Note   Patient: Patricia Solis           Date of Birth: May 09, 1951           MRN: [id number] Visit Date: 05/05/2021              Requested by: Dorothyann Peng, NP [place],  [place] [address] PCP: Dorothyann Peng, NP   Assessment & Plan: Visit Diagnoses:  1. Chronic pain of left knee   2. Pain in right hand   3. Neck pain   4. Other spondylosis with radiculopathy, cervical region   5. History of fusion of cervical spine   6. Pes anserine bursitis     Plan: We reviewed x-ray changes.  Some of her hand symptoms may be coming from the cervical spine.  We will set up for some physical therapy she can be treated for her left knee pes bursitis  Follow-Up Instructions: Return in about 5 weeks (around 06/09/2021).   Orders:  Orders Placed This Encounter  Procedures   XR Knee 1-2 Views Left   XR Hand Complete Right   XR Cervical Spine 2 or 3 views   Ambulatory referral to Physical Therapy   No orders of the defined types were placed in this encounter.     Procedures: No procedures performed   Clinical Data: No additional findings.   Subjective: Chief Complaint  Patient presents with   Left Knee - Pain   Right Hand - Pain    HPI 70 year old female seen with right hand pain and left knee pain.  Left knee has been present for several months.  She been seen by Dr. Junius Roads in the past with problems going up and down stairs with swelling.  She states his entire right hand has been swollen throbbing but no numbness or tingling no associated neck pain.  Left knee pain is primarily just below the knee on the medial aspect.  Does not recall any specific injury.  Post history of right breast cancer 2003.  Positive for hypertension hyperlipidemia.  Positive for GERD.  Review of Systems all other systems updated and noncontributory.   Objective: Vital Signs: There were no vitals taken for this visit.  Physical Exam Constitutional:       Appearance: She is well-developed.  HENT:     Head: Normocephalic.     Right Ear: External ear normal.     Left Ear: External ear normal. There is no impacted cerumen.  Eyes:     Pupils: Pupils are equal, round, and reactive to light.  Neck:     Thyroid: No thyromegaly.     Trachea: No tracheal deviation.  Cardiovascular:     Rate and Rhythm: Normal rate.  Pulmonary:     Effort: Pulmonary effort is normal.  Abdominal:     Palpations: Abdomen is soft.  Musculoskeletal:     Cervical back: No rigidity.  Skin:    General: Skin is warm and dry.  Neurological:     Mental Status: She is alert and oriented to person, place, and time.  Psychiatric:        Behavior: Behavior normal.    Ortho Exam negative Spurling on the left and right.  No triggering of the hand.  Full flexion extension.  Mild tenderness over the flexor tendons.  No thenar atrophy.  Good extension of the fingers.  Normal capillary refill.  Upper extremity reflexes are 2+ and symmetrical.  Left knee shows exquisite tenderness over  the pes bursa.  Pain with extension and flexion.  Specialty Comments:  No specialty comments available.  Imaging: No results found.   PMFS History: Patient Active Problem List   Diagnosis Date Noted   Other spondylosis with radiculopathy, cervical region 05/11/2021   History of fusion of cervical spine 05/11/2021   Pes anserine bursitis 05/11/2021   GERD (gastroesophageal reflux disease) 09/29/2020   Esophageal stricture 01/26/2011   Arrhythmia 12/29/2010   Essential hypertension 07/21/2010   CONTACT DERMATITIS&OTHER ECZEMA DUE UNSPEC CAUSE 04/07/2010   Hyperlipidemia 09/11/2007   Migraine headache 09/11/2007   Hypothyroidism 01/26/2007   Past Medical History:  Diagnosis Date   Basal cell carcinoma 2007   [facility]   Breast cancer ([place])    Invasive lobular carcinoma RIGHT breast   Carotid bruit 08/2008   Normal Doppler   DUB (dysfunctional uterine bleeding)    Esophagitis    HTN  (hypertension)    Tx started 06/2010   Hyperlipidemia    Hypothyroidism    MHA (microangiopathic hemolytic anemia) (HCC)    Personal history of chemotherapy    2003   Personal history of radiation therapy    2003 right breast   Spinal stenosis     Family History  Problem Relation Age of Onset   Heart attack Father    Coronary artery disease Father    Diabetes Father    Coronary artery disease Mother    Leukemia Mother    Thyroid disease Mother    Multiple sclerosis Sister        Twin   Breast cancer Maternal Aunt     Past Surgical History:  Procedure Laterality Date   BREAST BIOPSY Right    2007 benign   BREAST LUMPECTOMY Right    2003   BTL     CATARACT EXTRACTION, BILATERAL Bilateral 03/26/2016   still blurred vision; Md states this is fine   CERVICAL LAMINECTOMY     Left   FOOT SURGERY     SHOULDER ARTHROSCOPY Right    TUBAL LIGATION  1981   Social History   Occupational History   Not on file  Tobacco Use   Smoking status: Never   Smokeless tobacco: Never  Substance and Sexual Activity   Alcohol use: No   Drug use: No   Sexual activity: Not on file

## 2021-05-14 ENCOUNTER — Ambulatory Visit: Payer: PPO | Admitting: Physical Therapy

## 2021-05-14 ENCOUNTER — Encounter: Payer: Self-pay | Admitting: Physical Therapy

## 2021-05-14 ENCOUNTER — Other Ambulatory Visit: Payer: Self-pay

## 2021-05-14 DIAGNOSIS — G8929 Other chronic pain: Secondary | ICD-10-CM

## 2021-05-14 DIAGNOSIS — M25562 Pain in left knee: Secondary | ICD-10-CM | POA: Diagnosis not present

## 2021-05-14 DIAGNOSIS — R600 Localized edema: Secondary | ICD-10-CM

## 2021-05-14 DIAGNOSIS — M542 Cervicalgia: Secondary | ICD-10-CM | POA: Diagnosis not present

## 2021-05-14 DIAGNOSIS — M6281 Muscle weakness (generalized): Secondary | ICD-10-CM

## 2021-05-14 NOTE — Patient Instructions (Signed)
Access Code: [email or web address] URL: [email or web address] Date: 05/14/2021 Prepared by: Elsie Ra  Exercises Supine Quadriceps Stretch with Strap on Table - 2 x daily - 6 x weekly - 3 reps - 30 hold Supine Hamstring Stretch with Strap - 2 x daily - 6 x weekly - 1 sets - 2 reps - 30 hold Seated Straight Leg Heel Taps - 2 x daily - 6 x weekly - 2-3 sets - 10 reps Hip Abduction with Resistance Loop - 2 x daily - 6 x weekly - 1-2 sets - 15-20 reps Hip Extension with Resistance Loop - 2 x daily - 6 x weekly - 1-2 sets - 15-20 reps standing march with resistance loop - 2 x daily - 6 x weekly - 3 sets - 10 reps Wall Quarter Squat - 2 x daily - 6 x weekly - 3 sets - 10 reps - 10 sec hold

## 2021-05-14 NOTE — Therapy (Signed)
[facility] Physical Therapy [address] [place], [place], [address] Phone: [phone]   Fax:  [phone]  Physical Therapy Evaluation  Patient Details  Name: Patricia Solis MRN: [id number] Date of Birth: Jan 15, 1951 Referring Provider (PT): Lorin Mercy, MD   Encounter Date: 05/14/2021   PT End of Session - 05/14/21 0903     Visit Number 1    Number of Visits 10    Date for PT Re-Evaluation 06/25/21    PT Start Time 0805    PT Stop Time 3500    PT Time Calculation (min) 42 min    Activity Tolerance Patient tolerated treatment well    Behavior During Therapy [facility] for tasks assessed/performed             Past Medical History:  Diagnosis Date   Basal cell carcinoma 2007   [place]   Breast cancer ([place])    Invasive lobular carcinoma RIGHT breast   Carotid bruit 08/2008   Normal Doppler   DUB (dysfunctional uterine bleeding)    Esophagitis    HTN (hypertension)    Tx started 06/2010   Hyperlipidemia    Hypothyroidism    MHA (microangiopathic hemolytic anemia) ([place])    Personal history of chemotherapy    2003   Personal history of radiation therapy    2003 right breast   Spinal stenosis     Past Surgical History:  Procedure Laterality Date   BREAST BIOPSY Right    2007 benign   BREAST LUMPECTOMY Right    2003   BTL     CATARACT EXTRACTION, BILATERAL Bilateral 03/26/2016   still blurred vision; Md states this is fine   CERVICAL LAMINECTOMY     Left   FOOT SURGERY     SHOULDER ARTHROSCOPY Right    TUBAL LIGATION  1981    There were no vitals filed for this visit.    Subjective Assessment - 05/14/21 0807     Subjective She has had chronic Lt knee pain for over a year without known MOI. She says MD thinks it is bursitis. Her knee is her biggest compaint right now , She also has chronic neck pain and has C5-7 fusion    How long can you walk comfortably? a mile sometimes, sometimes cant    Diagnostic tests knee XR "negative for acute or  degenerative changes" neck XR "Solid C5-C7 fusion with spondylosis above and below the fusion."    Patient Stated Goals reduce the pain    Currently in Pain? Yes    Pain Score 6     Pain Location Knee    Pain Orientation Left;Anterior;Medial    Pain Descriptors / Indicators Aching;Dull;Sharp    Pain Type Chronic pain    Pain Radiating Towards deneis N/T    Pain Onset More than a month ago    Pain Frequency Intermittent    Aggravating Factors  squatting, lunging, getting up from the floor    Pain Relieving Factors biofreeze sometimes                [facility] PT Assessment - 05/14/21 0001       Assessment   Medical Diagnosis Chronic pain of Lt knee, bursitis, chronic neck pain with history of fusion C5-7    Referring Provider (PT) Lorin Mercy, MD    Onset Date/Surgical Date --   chronic pain >1 year   Prior Therapy no recent PT, had PT last year for LBP and lower leg pain      Precautions  Precautions None      Restrictions   Weight Bearing Restrictions No      Balance Screen   Has the patient fallen in the past 6 months No    Has the patient had a decrease in activity level because of a fear of falling?  No    Is the patient reluctant to leave their home because of a fear of falling?  No      Home Ecologist residence      Prior Function   Level of Independence Independent      Cognition   Overall Cognitive Status Within Functional Limits for tasks assessed      Observation/Other Assessments   Focus on Therapeutic Outcomes (FOTO)  66% functional goal 72%      ROM / Strength   AROM / PROM / Strength AROM;Strength      Strength   Overall Strength Comments Lt hip 4/5 grossly, Rt hip 4+, Bilat knees 4+, fair quad contraction      Flexibility   Soft Tissue Assessment /Muscle Length --   very tight quad and hip flexor on Lt, mildly tight H.S on LT     Palpation   Patella mobility good patella mobility, normal tracking    Palpation comment  TTP anterior and posterior medial knee      Ambulation/Gait   Gait Comments independent community ambulator, mild antagic gait (speends more time in stance phase on Lt) and mild slower velocity      Standardized Balance Assessment   Standardized Balance Assessment Five Times Sit to Stand    Five times sit to stand comments  10 seconds no UE                        Objective measurements completed on examination: See above findings.       [facility] Adult PT Treatment/Exercise - 05/14/21 0001       Exercises   Exercises Knee/Hip      Knee/Hip Exercises: Stretches   Active Hamstring Stretch Left;2 reps;30 seconds    Active Hamstring Stretch Limitations supine with strap    Quad Stretch Left;2 reps;30 seconds    Quad Stretch Limitations supine leg off EOB with strap      Knee/Hip Exercises: Standing   Other Standing Knee Exercises wall squat 1/4 ROM 10 sec X5    Other Standing Knee Exercises demonstrated for her hip marches, abd, extensions with red band      Knee/Hip Exercises: Seated   Other Seated Knee/Hip Exercises seated SLR X10 on Lt      Knee/Hip Exercises: Supine   Single Leg Bridge Left;5 reps   hold 5 sec                    PT Education - 05/14/21 0902     Education Details HEP,POC,activity reduction education if having pain in her knee    Person(s) Educated Patient    Methods Explanation;Demonstration;Verbal cues;Handout    Comprehension Verbalized understanding;Returned demonstration;Need further instruction              PT Short Term Goals - 05/14/21 0912       PT SHORT TERM GOAL #1   Title Ind with initial HEP    Time 4    Period Weeks    Status New    Target Date 06/11/21      PT SHORT TERM GOAL #2   Title Patient  to report decreased pain in left knee pain by 25% with ADLS.    Time 4    Period Weeks    Status New    Target Date 06/11/21      PT SHORT TERM GOAL #3   Title -               PT Long Term Goals -  05/14/21 0916       PT LONG TERM GOAL #1   Title Improve FOTO to >72% functional    Time 6    Period Weeks    Status New    Target Date 06/25/21      PT LONG TERM GOAL #2   Title Patient to report improvement in left leg pain by 50% with ADLs.    Time 6    Period Weeks    Status New      PT LONG TERM GOAL #3   Title Improved LE hip/knee strength to 5/5 MMT tested in sitting to improve function.    Time 6    Period Weeks    Status New      PT LONG TERM GOAL #4   Title Improve Lt knee AROM to at least 125 deg to improve mobility    Baseline 115    Time 6    Period Weeks    Status New      PT LONG TERM GOAL #5   Title Improved 5x sit to stand to <= 8 seconds to show improved leg endurance and balance    Baseline 10 sec    Time 8    Status New                    Plan - 05/14/21 0904     Clinical Impression Statement Pt presents with Chronic pain of Lt knee, bursitis, chronic neck pain with history of fusion C5-7. Her knee is her biggest complaint so we will focus on this first as she is already doing stretches for her neck. She will benefit from skilled PT to address her strength and tightness deficits in her Lt hip/knee limiting her function.    Personal Factors and Comorbidities Comorbidity 3+    Comorbidities TWS:FKCLEXNT fusion,GERD,breast CA    Examination-Activity Limitations Bend;Lift;Stand;Stairs;Squat;Locomotion Level    Examination-Participation Restrictions Cleaning;Community Activity    Stability/Clinical Decision Making Evolving/Moderate complexity    Clinical Decision Making Moderate    Rehab Potential Good    PT Frequency 2x / week   1-2   PT Duration 6 weeks    PT Treatment/Interventions Cryotherapy;Electrical Stimulation;Iontophoresis 4mg /ml Dexamethasone;Ultrasound;Moist Heat;Gait training;Therapeutic activities;Therapeutic exercise;Neuromuscular re-education;Manual techniques;Taping;Vasopneumatic Device;Joint Manipulations;Dry needling;Passive  range of motion    PT Next Visit Plan how is HEP?, needs hip strength and quad strength focus to her tolerance, will focus on knee for now but can address neck if needed    PT Home Exercise Plan Access Code: [email or web address]    Consulted and Agree with Plan of Care Patient             Patient will benefit from skilled therapeutic intervention in order to improve the following deficits and impairments:  Decreased activity tolerance, Decreased range of motion, Decreased strength, Difficulty walking, Impaired flexibility, Pain  Visit Diagnosis: Chronic pain of left knee  Muscle weakness (generalized)  Cervicalgia  Localized edema     Problem List Patient Active Problem List   Diagnosis Date Noted   Other spondylosis with radiculopathy, cervical region 05/11/2021   History of  fusion of cervical spine 05/11/2021   Pes anserine bursitis 05/11/2021   GERD (gastroesophageal reflux disease) 09/29/2020   Esophageal stricture 01/26/2011   Arrhythmia 12/29/2010   Essential hypertension 07/21/2010   CONTACT DERMATITIS&OTHER ECZEMA DUE UNSPEC CAUSE 04/07/2010   Hyperlipidemia 09/11/2007   Migraine headache 09/11/2007   Hypothyroidism 01/26/2007    Debbe Odea, PT,DPT 05/14/2021, 9:21 AM  [facility] Physical Therapy [address] [place], [place], [address] Phone: [phone]   Fax:  [phone]  Name: Patricia Solis MRN: [id number] Date of Birth: 1951/07/16

## 2021-05-18 ENCOUNTER — Other Ambulatory Visit: Payer: Self-pay | Admitting: Adult Health

## 2021-05-22 ENCOUNTER — Other Ambulatory Visit: Payer: Self-pay

## 2021-05-22 ENCOUNTER — Ambulatory Visit: Payer: PPO | Admitting: Physical Therapy

## 2021-05-22 DIAGNOSIS — G8929 Other chronic pain: Secondary | ICD-10-CM

## 2021-05-22 DIAGNOSIS — M542 Cervicalgia: Secondary | ICD-10-CM | POA: Diagnosis not present

## 2021-05-22 DIAGNOSIS — R600 Localized edema: Secondary | ICD-10-CM | POA: Diagnosis not present

## 2021-05-22 DIAGNOSIS — M6281 Muscle weakness (generalized): Secondary | ICD-10-CM

## 2021-05-22 DIAGNOSIS — M25562 Pain in left knee: Secondary | ICD-10-CM

## 2021-05-22 NOTE — Therapy (Signed)
[facility] Physical Therapy [address] [place], [place], [address] Phone: [phone]   Fax:  [phone]  Physical Therapy Treatment  Patient Details  Name: Patricia Solis MRN: [id number] Date of Birth: 1950/10/13 Referring Provider (PT): Lorin Mercy, MD   Encounter Date: 05/22/2021   PT End of Session - 05/22/21 1036     Visit Number 2    Number of Visits 10    Date for PT Re-Evaluation 06/25/21    PT Start Time 0935    PT Stop Time 0762    PT Time Calculation (min) 40 min    Activity Tolerance Patient tolerated treatment well    Behavior During Therapy [facility] for tasks assessed/performed             Past Medical History:  Diagnosis Date   Basal cell carcinoma 2007   [place]   Breast cancer ([place])    Invasive lobular carcinoma RIGHT breast   Carotid bruit 08/2008   Normal Doppler   DUB (dysfunctional uterine bleeding)    Esophagitis    HTN (hypertension)    Tx started 06/2010   Hyperlipidemia    Hypothyroidism    MHA (microangiopathic hemolytic anemia) ([place])    Personal history of chemotherapy    2003   Personal history of radiation therapy    2003 right breast   Spinal stenosis     Past Surgical History:  Procedure Laterality Date   BREAST BIOPSY Right    2007 benign   BREAST LUMPECTOMY Right    2003   BTL     CATARACT EXTRACTION, BILATERAL Bilateral 03/26/2016   still blurred vision; Md states this is fine   CERVICAL LAMINECTOMY     Left   FOOT SURGERY     SHOULDER ARTHROSCOPY Right    TUBAL LIGATION  1981    There were no vitals filed for this visit.   Subjective Assessment - 05/22/21 1033     Subjective she relays about 3/10 overall pain in her Lt anterior-medial knee. She says the pain feels like burning pain.  She says her neck has not really been bothering her.    How long can you walk comfortably? a mile sometimes, sometimes cant    Diagnostic tests knee XR "negative for acute or degenerative changes" neck XR "Solid  C5-C7 fusion with spondylosis above and below the fusion."    Patient Stated Goals reduce the pain    Pain Onset More than a month ago               [facility] Adult PT Treatment/Exercise - 05/22/21 0001       Knee/Hip Exercises: Stretches   Active Hamstring Stretch Left;2 reps;30 seconds    Active Hamstring Stretch Limitations supine with strap    Quad Stretch Left;30 seconds;3 reps    Quad Stretch Limitations supine leg off EOB with strap    Gastroc Stretch Both;3 reps;30 seconds    Gastroc Stretch Limitations slantboard      Knee/Hip Exercises: Aerobic   Nustep L5 X5 min      Knee/Hip Exercises: Machines for Strengthening   Cybex Leg Press 125# DL 2X20, then Lt leg only 56# 3X15      Knee/Hip Exercises: Standing   Other Standing Knee Exercises hip 4 way with red X15 ea bilat using 1# bar for balance      Knee/Hip Exercises: Seated   Long Arc Quad Left;3 sets;10 reps    Long Arc Quad Weight 5 lbs.  Modalities   Modalities Iontophoresis      Iontophoresis   Type of Iontophoresis Dexamethasone    Location Lt knee    Dose 1.0 CC    Time 4-6 hour wear home patch      Manual Therapy   Manual therapy comments ice massage to Lt anterior medial knee 5 min                       PT Short Term Goals - 05/14/21 0912       PT SHORT TERM GOAL #1   Title Ind with initial HEP    Time 4    Period Weeks    Status New    Target Date 06/11/21      PT SHORT TERM GOAL #2   Title Patient to report decreased pain in left knee pain by 25% with ADLS.    Time 4    Period Weeks    Status New    Target Date 06/11/21      PT SHORT TERM GOAL #3   Title -               PT Long Term Goals - 05/14/21 0916       PT LONG TERM GOAL #1   Title Improve FOTO to >72% functional    Time 6    Period Weeks    Status New    Target Date 06/25/21      PT LONG TERM GOAL #2   Title Patient to report improvement in left leg pain by 50% with ADLs.    Time 6     Period Weeks    Status New      PT LONG TERM GOAL #3   Title Improved LE hip/knee strength to 5/5 MMT tested in sitting to improve function.    Time 6    Period Weeks    Status New      PT LONG TERM GOAL #4   Title Improve Lt knee AROM to at least 125 deg to improve mobility    Baseline 115    Time 6    Period Weeks    Status New      PT LONG TERM GOAL #5   Title Improved 5x sit to stand to <= 8 seconds to show improved leg endurance and balance    Baseline 10 sec    Time 8    Status New                   Plan - 05/22/21 1038     Clinical Impression Statement She had some inflammation noted around pes anserine bursa location today so we trialed ice massage and ionto to this after her strength/stretching program in efforts to reduce her overall pain and inflammation. She does note less pain at end of session and we will assses her longer term response to this next visit.    Personal Factors and Comorbidities Comorbidity 3+    Comorbidities MHW:[id number] fusion,GERD,breast CA    Examination-Activity Limitations Bend;Lift;Stand;Stairs;Squat;Locomotion Level    Examination-Participation Restrictions Cleaning;Community Activity    Stability/Clinical Decision Making Evolving/Moderate complexity    Rehab Potential Good    PT Frequency 2x / week   1-2   PT Duration 6 weeks    PT Treatment/Interventions Cryotherapy;Electrical Stimulation;Iontophoresis 4mg /ml Dexamethasone;Ultrasound;Moist Heat;Gait training;Therapeutic activities;Therapeutic exercise;Neuromuscular re-education;Manual techniques;Taping;Vasopneumatic Device;Joint Manipulations;Dry needling;Passive range of motion    PT Next Visit Plan how was ionto? ice massage?  needs  hip strength and quad strength focus to her tolerance, will focus on knee for now but can address neck if needed    PT Home Exercise Plan Access Code: [email or web address]    Consulted and Agree with Plan of Care Patient             Patient will benefit  from skilled therapeutic intervention in order to improve the following deficits and impairments:  Decreased activity tolerance, Decreased range of motion, Decreased strength, Difficulty walking, Impaired flexibility, Pain  Visit Diagnosis: Chronic pain of left knee  Muscle weakness (generalized)  Cervicalgia  Localized edema     Problem List Patient Active Problem List   Diagnosis Date Noted   Other spondylosis with radiculopathy, cervical region 05/11/2021   History of fusion of cervical spine 05/11/2021   Pes anserine bursitis 05/11/2021   GERD (gastroesophageal reflux disease) 09/29/2020   Esophageal stricture 01/26/2011   Arrhythmia 12/29/2010   Essential hypertension 07/21/2010   CONTACT DERMATITIS&OTHER ECZEMA DUE UNSPEC CAUSE 04/07/2010   Hyperlipidemia 09/11/2007   Migraine headache 09/11/2007   Hypothyroidism 01/26/2007    Debbe Odea, PT,DPT 05/22/2021, 10:42 AM  [facility] Therapy [address] [place], [place], [address] Phone: [phone]   Fax:  [phone]  Name: Najla Aughenbaugh MRN: [id number] Date of Birth: 1951-03-12

## 2021-05-25 ENCOUNTER — Other Ambulatory Visit: Payer: Self-pay

## 2021-05-26 ENCOUNTER — Encounter: Payer: PPO | Admitting: Physical Therapy

## 2021-05-26 ENCOUNTER — Ambulatory Visit (INDEPENDENT_AMBULATORY_CARE_PROVIDER_SITE_OTHER): Payer: PPO | Admitting: Adult Health

## 2021-05-26 ENCOUNTER — Encounter: Payer: Self-pay | Admitting: Adult Health

## 2021-05-26 VITALS — BP 120/64 | HR 75 | Temp 98.9°F | Ht 61.0 in | Wt 148.5 lb

## 2021-05-26 DIAGNOSIS — Z23 Encounter for immunization: Secondary | ICD-10-CM

## 2021-05-26 DIAGNOSIS — G43811 Other migraine, intractable, with status migrainosus: Secondary | ICD-10-CM | POA: Diagnosis not present

## 2021-05-26 DIAGNOSIS — I700 Atherosclerosis of aorta: Secondary | ICD-10-CM

## 2021-05-26 DIAGNOSIS — I10 Essential (primary) hypertension: Secondary | ICD-10-CM

## 2021-05-26 DIAGNOSIS — E782 Mixed hyperlipidemia: Secondary | ICD-10-CM | POA: Diagnosis not present

## 2021-05-26 DIAGNOSIS — E039 Hypothyroidism, unspecified: Secondary | ICD-10-CM | POA: Diagnosis not present

## 2021-05-26 DIAGNOSIS — K219 Gastro-esophageal reflux disease without esophagitis: Secondary | ICD-10-CM

## 2021-05-26 DIAGNOSIS — Z0000 Encounter for general adult medical examination without abnormal findings: Secondary | ICD-10-CM | POA: Diagnosis not present

## 2021-05-26 LAB — CBC WITH DIFFERENTIAL/PLATELET
Basophils Absolute: 0 10*3/uL (ref 0.0–0.1)
Basophils Relative: 0.7 % (ref 0.0–3.0)
Eosinophils Absolute: 0.3 10*3/uL (ref 0.0–0.7)
Eosinophils Relative: 4.3 % (ref 0.0–5.0)
HCT: 41.5 % (ref 36.0–46.0)
Hemoglobin: 13.9 g/dL (ref 12.0–15.0)
Lymphocytes Relative: 29.2 % (ref 12.0–46.0)
Lymphs Abs: 1.8 10*3/uL (ref 0.7–4.0)
MCHC: 33.6 g/dL (ref 30.0–36.0)
MCV: 91.5 fl (ref 78.0–100.0)
Monocytes Absolute: 0.4 10*3/uL (ref 0.1–1.0)
Monocytes Relative: 6.9 % (ref 3.0–12.0)
Neutro Abs: 3.6 10*3/uL (ref 1.4–7.7)
Neutrophils Relative %: 58.9 % (ref 43.0–77.0)
Platelets: 273 10*3/uL (ref 150.0–400.0)
RBC: 4.53 Mil/uL (ref 3.87–5.11)
RDW: 14.1 % (ref 11.5–15.5)
WBC: 6.2 10*3/uL (ref 4.0–10.5)

## 2021-05-26 LAB — COMPREHENSIVE METABOLIC PANEL
ALT: 20 U/L (ref 0–35)
AST: 20 U/L (ref 0–37)
Albumin: 4.5 g/dL (ref 3.5–5.2)
Alkaline Phosphatase: 64 U/L (ref 39–117)
BUN: 19 mg/dL (ref 6–23)
CO2: 30 mEq/L (ref 19–32)
Calcium: 9.9 mg/dL (ref 8.4–10.5)
Chloride: 100 mEq/L (ref 96–112)
Creatinine, Ser: 0.71 mg/dL (ref 0.40–1.20)
GFR: 86.25 mL/min (ref >60.00)
Glucose, Bld: 95 mg/dL (ref 70–99)
Potassium: 3.9 mEq/L (ref 3.5–5.1)
Sodium: 139 mEq/L (ref 135–145)
Total Bilirubin: 0.5 mg/dL (ref 0.2–1.2)
Total Protein: 7.4 g/dL (ref 6.0–8.3)

## 2021-05-26 LAB — LIPID PANEL
Cholesterol: 178 mg/dL (ref 0–200)
HDL: 67.7 mg/dL (ref >39.00)
LDL Cholesterol: 93 mg/dL (ref 0–99)
NonHDL: 110.35
Total CHOL/HDL Ratio: 3
Triglycerides: 85 mg/dL (ref 0.0–149.0)
VLDL: 17 mg/dL (ref 0.0–40.0)

## 2021-05-26 LAB — TSH: TSH: 0.93 u[IU]/mL (ref 0.35–5.50)

## 2021-05-26 NOTE — Progress Notes (Signed)
Subjective:    Patient ID: Patricia Solis, female    DOB: 10/01/1950, 70 y.o.   MRN: [id number]  HPI Patient presents for yearly preventative medicine examination. She is a pleasant 70 year old female who  has a past medical history of Basal cell carcinoma (2007), Breast cancer ([place]), Carotid bruit (08/2008), DUB (dysfunctional uterine bleeding), Esophagitis, HTN (hypertension), Hyperlipidemia, Hypothyroidism, MHA (microangiopathic hemolytic anemia) ([place]), Personal history of chemotherapy, Personal history of radiation therapy, and Spinal stenosis.  Hypertension-prescribed HCTZ 12.5 mg and Cozaar 25 mg daily.  She denies dizziness, lightheadedness, chest pain, shortness of breath.  She does monitor her blood pressures at home routinely gets readings in the 120s to 130s over 70s to 80s BP Readings from Last 3 Encounters:  05/26/21 120/64  09/29/20 128/70  05/22/20 132/80   Hypothyroidism-currently maintained on Synthroid 88 mcg daily.  Hyperlipidemia-takes simvastatin 40 mg daily.  She denies myalgia or fatigue Lab Results  Component Value Date   CHOL 225 (H) 05/22/2020   HDL 74 05/22/2020   LDLCALC 131 (H) 05/22/2020   LDLDIRECT 140.4 09/02/2006   TRIG 95 05/22/2020   CHOLHDL 3.0 05/22/2020   GERD-controlled with Prilosec 20 mg  Migraine headaches- having infrequent migraines. She has been seen by neurology in the past but did not have good results with the medications that they prescribed.  When she does have a migraine she will use BC powder at home, this seems to work better than prescription medications for her.   All immunizations and health maintenance protocols were reviewed with the patient and needed orders were placed.  Appropriate screening laboratory values were ordered for the patient including screening of hyperlipidemia, renal function and hepatic function. If indicated by BPH, a PSA was ordered.  Medication reconciliation,  past medical history, social history,  problem list and allergies were reviewed in detail with the patient  Goals were established with regard to weight loss, exercise, and  diet in compliance with medications. She continues to work out at [employer] and is eating healthy.  Wt Readings from Last 3 Encounters:  05/26/21 148 lb 8 oz (67.4 kg)  09/29/20 153 lb (69.4 kg)  05/23/20 154 lb (69.9 kg)   Review of Systems  Constitutional: Negative.   HENT: Negative.    Eyes: Negative.   Respiratory: Negative.    Cardiovascular: Negative.   Gastrointestinal: Negative.   Endocrine: Negative.   Genitourinary: Negative.   Musculoskeletal: Negative.   Skin: Negative.   Allergic/Immunologic: Negative.   Neurological: Negative.   Hematological: Negative.   Psychiatric/Behavioral: Negative.    Past Medical History:  Diagnosis Date   Basal cell carcinoma 2007   BCC   Breast cancer ([place])    Invasive lobular carcinoma RIGHT breast   Carotid bruit 08/2008   Normal Doppler   DUB (dysfunctional uterine bleeding)    Esophagitis    HTN (hypertension)    Tx started 06/2010   Hyperlipidemia    Hypothyroidism    MHA (microangiopathic hemolytic anemia) ([facility])    Personal history of chemotherapy    2003   Personal history of radiation therapy    2003 right breast   Spinal stenosis     Social History   Socioeconomic History   Marital status: Married    Spouse name: Not on file   Number of children: Not on file   Years of education: Not on file   Highest education level: Not on file  Occupational History   Not on file  Tobacco Use   Smoking status: Never   Smokeless tobacco: Never  Substance and Sexual Activity   Alcohol use: No   Drug use: No   Sexual activity: Not on file  Other Topics Concern   Not on file  Social History Narrative   Retired in March    Married for 54 years    One daughter, lives in [place]    Has four grandchildren      Likes to play with grand kids, go to [employer] and mountains.       Social  Determinants of Health   Financial Resource Strain: Low Risk    Difficulty of Paying Living Expenses: Not hard at all  Food Insecurity: No Food Insecurity   Worried About Charity fundraiser in the Last Year: Never true   [place] in the Last Year: Never true  Transportation Needs: No Transportation Needs   Lack of Transportation (Medical): No   Lack of Transportation (Non-Medical): No  Physical Activity: Sufficiently Active   Days of Exercise per Week: 3 days   Minutes of Exercise per Session: 120 min  Stress: No Stress Concern Present   Feeling of Stress : Not at all  Social Connections: Moderately Integrated   Frequency of Communication with Friends and Family: Twice a week   Frequency of Social Gatherings with Friends and Family: More than three times a week   Attends Religious Services: 1 to 4 times per year   Active Member of [employer] or Organizations: No   Attends Archivist Meetings: Never   Marital Status: Married  Human resources officer Violence: Not At Risk   Fear of Current or Ex-Partner: No   Emotionally Abused: No   Physically Abused: No   Sexually Abused: No    Past Surgical History:  Procedure Laterality Date   BREAST BIOPSY Right    2007 benign   BREAST LUMPECTOMY Right    2003   BTL     CATARACT EXTRACTION, BILATERAL Bilateral 03/26/2016   still blurred vision; Md states this is fine   CERVICAL LAMINECTOMY     Left   FOOT SURGERY     SHOULDER ARTHROSCOPY Right    TUBAL LIGATION  1981    Family History  Problem Relation Age of Onset   Heart attack Father    Coronary artery disease Father    Diabetes Father    Coronary artery disease Mother    Leukemia Mother    Thyroid disease Mother    Multiple sclerosis Sister        Twin   Breast cancer Maternal Aunt     Allergies  Allergen Reactions   Ambien Cr [Zolpidem] Other (See Comments)    Hallucinations   Cozaar [Losartan] Cough   Erythromycin     REACTION: rash   Hydrocodone Other  (See Comments)    Hallucinations   Lisinopril Cough   Penicillins     REACTION: rash   Clindamycin/Lincomycin Rash    Current Outpatient Medications on File Prior to Visit  Medication Sig Dispense Refill   aspirin 81 MG EC tablet Take 81 mg by mouth daily.     B Complex-C (B-COMPLEX WITH VITAMIN C) tablet Take 1 tablet by mouth daily.     calcium carbonate (OS-CAL) 600 MG TABS tablet Take 1 tablet (600 mg total) by mouth 2 (two) times daily with a meal. 180 tablet 3   Cholecalciferol (VITAMIN D3 PO) Take by mouth.  Coenzyme Q10 200 MG TABS Take 200 mg by mouth daily.     CRANBERRY PO Take 2 tablets by mouth at bedtime.     EUTHYROX 75 MCG tablet TAKE 1 TABLET BY MOUTH ONCE DAILY BEFORE BREAKFAST 90 tablet 0   famotidine (PEPCID) 40 MG tablet TAKE 1 TABLET BY MOUTH AT BEDTIME 30 tablet 0   GLUCOSAMINE PO Take 1 tablet by mouth 2 (two) times daily.     hydrochlorothiazide (HYDRODIURIL) 25 MG tablet Take 1 tablet (25 mg total) by mouth daily. 90 tablet 3   hydrocortisone 2.5 % ointment Apply topically 2 (two) times daily.     magnesium oxide (MAG-OX) 400 MG tablet Take 400 mg by mouth daily.     omeprazole (PRILOSEC) 20 MG capsule Take 1 capsule by mouth once daily 90 capsule 0   PFIZER-BIONT COVID-19 VAC-TRIS SUSP injection      rosuvastatin (CRESTOR) 40 MG tablet Take 1 tablet (40 mg total) by mouth daily. 90 tablet 3   No current facility-administered medications on file prior to visit.    BP 120/64 (BP Location: Left Arm, Patient Position: Sitting, Cuff Size: Normal)   Pulse 75   Temp 98.9 F (37.2 C) (Oral)   Ht 5\' 1"  (1.549 m)   Wt 148 lb 8 oz (67.4 kg)   SpO2 96%   BMI 28.06 kg/m       Objective:   Physical Exam Vitals and nursing note reviewed.  Constitutional:      General: She is not in acute distress.    Appearance: Normal appearance. She is well-developed. She is not ill-appearing.  HENT:     Head: Normocephalic and atraumatic.     Right Ear: Tympanic  membrane, ear canal and external ear normal. There is no impacted cerumen.     Left Ear: Tympanic membrane, ear canal and external ear normal. There is no impacted cerumen.     Nose: Nose normal. No congestion or rhinorrhea.     Mouth/Throat:     Mouth: Mucous membranes are moist.     Pharynx: Oropharynx is clear. No oropharyngeal exudate or posterior oropharyngeal erythema.  Eyes:     General:        Right eye: No discharge.        Left eye: No discharge.     Extraocular Movements: Extraocular movements intact.     Conjunctiva/sclera: Conjunctivae normal.     Pupils: Pupils are equal, round, and reactive to light.  Neck:     Thyroid: No thyromegaly.     Vascular: No carotid bruit.     Trachea: No tracheal deviation.  Cardiovascular:     Rate and Rhythm: Normal rate and regular rhythm.     Pulses: Normal pulses.     Heart sounds: Normal heart sounds. No murmur heard.   No friction rub. No gallop.  Pulmonary:     Effort: Pulmonary effort is normal. No respiratory distress.     Breath sounds: Normal breath sounds. No stridor. No wheezing, rhonchi or rales.  Chest:     Chest wall: No tenderness.  Abdominal:     General: Abdomen is flat. Bowel sounds are normal. There is no distension.     Palpations: Abdomen is soft. There is no mass.     Tenderness: There is no abdominal tenderness. There is no right CVA tenderness, left CVA tenderness, guarding or rebound.     Hernia: No hernia is present.  Musculoskeletal:        General:  No swelling, tenderness, deformity or signs of injury. Normal range of motion.     Cervical back: Normal range of motion and neck supple.     Right lower leg: No edema.     Left lower leg: No edema.  Lymphadenopathy:     Cervical: No cervical adenopathy.  Skin:    General: Skin is warm and dry.     Coloration: Skin is not jaundiced or pale.     Findings: No bruising, erythema, lesion or rash.  Neurological:     General: No focal deficit present.      Mental Status: She is alert and oriented to person, place, and time.     Cranial Nerves: No cranial nerve deficit.     Sensory: No sensory deficit.     Motor: No weakness.     Coordination: Coordination normal.     Gait: Gait normal.     Deep Tendon Reflexes: Reflexes normal.  Psychiatric:        Mood and Affect: Mood normal.        Behavior: Behavior normal.        Thought Content: Thought content normal.        Judgment: Judgment normal.      Assessment & Plan:  1. Routine general medical examination at a health care facility - Healthy and remarkable 70 year old female  - Follow up in one year or sooner if needed - CBC with Differential/Platelet; Future - Comprehensive metabolic panel; Future - Lipid panel; Future - TSH; Future  2. Essential hypertension - well controlled. No change in medications  - CBC with Differential/Platelet; Future - Comprehensive metabolic panel; Future - Lipid panel; Future - TSH; Future  3. Mixed hyperlipidemia - Consider increase in statin  - CBC with Differential/Platelet; Future - Comprehensive metabolic panel; Future - Lipid panel; Future - TSH; Future  4. Gastroesophageal reflux disease, unspecified whether esophagitis present -  - CBC with Differential/Platelet; Future - Comprehensive metabolic panel; Future - Lipid panel; Future - TSH; Future  5. Hypothyroidism, unspecified type - Consider dose change  - CBC with Differential/Platelet; Future - Comprehensive metabolic panel; Future - Lipid panel; Future - TSH; Future  6. Other migraine with status migrainosus, intractable  - CBC with Differential/Platelet; Future - Comprehensive metabolic panel; Future - Lipid panel; Future - TSH; Future  8. Need for immunization against influenza  - Flu Vaccine QUAD High Dose(Fluad)   Dorothyann Peng, NP

## 2021-05-26 NOTE — Patient Instructions (Addendum)
It was great seeing you today   We will follow up with you regarding your blood work   I will see you back in one year or sooner if needed 

## 2021-05-27 ENCOUNTER — Other Ambulatory Visit: Payer: Self-pay

## 2021-05-27 ENCOUNTER — Telehealth: Payer: Self-pay | Admitting: Adult Health

## 2021-05-27 ENCOUNTER — Ambulatory Visit: Payer: PPO | Admitting: Physical Therapy

## 2021-05-27 DIAGNOSIS — M542 Cervicalgia: Secondary | ICD-10-CM | POA: Diagnosis not present

## 2021-05-27 DIAGNOSIS — Z760 Encounter for issue of repeat prescription: Secondary | ICD-10-CM

## 2021-05-27 DIAGNOSIS — M25562 Pain in left knee: Secondary | ICD-10-CM

## 2021-05-27 DIAGNOSIS — G8929 Other chronic pain: Secondary | ICD-10-CM | POA: Diagnosis not present

## 2021-05-27 DIAGNOSIS — M6281 Muscle weakness (generalized): Secondary | ICD-10-CM

## 2021-05-27 DIAGNOSIS — E039 Hypothyroidism, unspecified: Secondary | ICD-10-CM

## 2021-05-27 DIAGNOSIS — R600 Localized edema: Secondary | ICD-10-CM | POA: Diagnosis not present

## 2021-05-27 MED ORDER — LEVOTHYROXINE SODIUM 75 MCG PO TABS: 75.0000 ug | ORAL_TABLET | Freq: Every day | ORAL | 3 refills | Status: DC

## 2021-05-27 MED ORDER — OMEPRAZOLE 20 MG PO CPDR: 20.0000 mg | DELAYED_RELEASE_CAPSULE | Freq: Every day | ORAL | 3 refills | Status: DC

## 2021-05-27 MED ORDER — FAMOTIDINE 40 MG PO TABS: 40.0000 mg | ORAL_TABLET | Freq: Every day | ORAL | 3 refills | Status: DC

## 2021-05-27 NOTE — Therapy (Signed)
[facility] [address] [place], [place], [address] Phone: [phone]   Fax:  [phone]  Physical Therapy Treatment  Patient Details  Name: Patricia Solis MRN: [id number] Date of Birth: 12-21-1950 Referring Provider (PT): Lorin Mercy, MD   Encounter Date: 05/27/2021   PT End of Session - 05/27/21 1517     Visit Number 3    Number of Visits 10    Date for PT Re-Evaluation 06/25/21    PT Start Time 1430    PT Stop Time 1512    PT Time Calculation (min) 42 min    Activity Tolerance Patient tolerated treatment well    Behavior During Therapy [facility] for tasks assessed/performed             Past Medical History:  Diagnosis Date   Basal cell carcinoma 2007   [place]   Breast cancer ([place])    Invasive lobular carcinoma RIGHT breast   Carotid bruit 08/2008   Normal Doppler   DUB (dysfunctional uterine bleeding)    Esophagitis    HTN (hypertension)    Tx started 06/2010   Hyperlipidemia    Hypothyroidism    MHA (microangiopathic hemolytic anemia) ([place])    Personal history of chemotherapy    2003   Personal history of radiation therapy    2003 right breast   Spinal stenosis     Past Surgical History:  Procedure Laterality Date   BREAST BIOPSY Right    2007 benign   BREAST LUMPECTOMY Right    2003   BTL     CATARACT EXTRACTION, BILATERAL Bilateral 03/26/2016   still blurred vision; Md states this is fine   CERVICAL LAMINECTOMY     Left   FOOT SURGERY     SHOULDER ARTHROSCOPY Right    TUBAL LIGATION  1981    There were no vitals filed for this visit.   Subjective Assessment - 05/27/21 1454     Subjective she relays about 2/10 overall knee pain and that it is a little better overall    How long can you walk comfortably? a mile sometimes, sometimes cant    Diagnostic tests knee XR "negative for acute or degenerative changes" neck XR "Solid C5-C7 fusion with spondylosis above and below the fusion."    Patient Stated Goals reduce  the pain    Pain Onset More than a month ago              [facility] Adult PT Treatment/Exercise - 05/27/21 0001       Knee/Hip Exercises: Stretches   Active Hamstring Stretch Left;2 reps;30 seconds    Active Hamstring Stretch Limitations supine with strap    Quad Stretch Left;30 seconds;3 reps    Quad Stretch Limitations supine leg off EOB with strap    Gastroc Stretch Both;3 reps;30 seconds    Gastroc Stretch Limitations slantboard      Knee/Hip Exercises: Aerobic   Recumbent Bike L3 X10 min      Knee/Hip Exercises: Machines for Strengthening   Cybex Leg Press 125# DL 3X15, then SL only 50# 2X15 bilat      Knee/Hip Exercises: Standing   Lateral Step Up Both;10 reps;Hand Hold: 1;Step Height: 6"    Forward Step Up Both;10 reps;Hand Hold: 1;Step Height: 6"    Other Standing Knee Exercises hip abd X20 bilat with red (some pain with this) then hip extensions red X15 bilat without pain      Knee/Hip Exercises: Seated   Long Arc Quad Both;20 reps  Long Arc Quad Weight 5 lbs.      Knee/Hip Exercises: Supine   Straight Leg Raises Both;15 reps                       PT Short Term Goals - 05/14/21 0912       PT SHORT TERM GOAL #1   Title Ind with initial HEP    Time 4    Period Weeks    Status New    Target Date 06/11/21      PT SHORT TERM GOAL #2   Title Patient to report decreased pain in left knee pain by 25% with ADLS.    Time 4    Period Weeks    Status New    Target Date 06/11/21      PT SHORT TERM GOAL #3   Title -               PT Long Term Goals - 05/14/21 0916       PT LONG TERM GOAL #1   Title Improve FOTO to >72% functional    Time 6    Period Weeks    Status New    Target Date 06/25/21      PT LONG TERM GOAL #2   Title Patient to report improvement in left leg pain by 50% with ADLs.    Time 6    Period Weeks    Status New      PT LONG TERM GOAL #3   Title Improved LE hip/knee strength to 5/5 MMT tested in sitting to  improve function.    Time 6    Period Weeks    Status New      PT LONG TERM GOAL #4   Title Improve Lt knee AROM to at least 125 deg to improve mobility    Baseline 115    Time 6    Period Weeks    Status New      PT LONG TERM GOAL #5   Title Improved 5x sit to stand to <= 8 seconds to show improved leg endurance and balance    Baseline 10 sec    Time 8    Status New                   Plan - 05/27/21 1517     Clinical Impression Statement She is showing some early progress with PT and her knee was not really hurting after session so we held off on modalties to see how she does with this. Continue POC.    Personal Factors and Comorbidities Comorbidity 3+    Comorbidities MWN:UUVOZDGU fusion,GERD,breast CA    Examination-Activity Limitations Bend;Lift;Stand;Stairs;Squat;Locomotion Level    Examination-Participation Restrictions Cleaning;Community Activity    Stability/Clinical Decision Making Evolving/Moderate complexity    Rehab Potential Good    PT Frequency 2x / week   1-2   PT Duration 6 weeks    PT Treatment/Interventions Cryotherapy;Electrical Stimulation;Iontophoresis 4mg /ml Dexamethasone;Ultrasound;Moist Heat;Gait training;Therapeutic activities;Therapeutic exercise;Neuromuscular re-education;Manual techniques;Taping;Vasopneumatic Device;Joint Manipulations;Dry needling;Passive range of motion    PT Next Visit Plan needs hip strength and quad strength focus to her tolerance, will focus on knee for now but can address neck if needed    PT Home Exercise Plan Access Code: [email or web address]    Consulted and Agree with Plan of Care Patient             Patient will benefit from skilled therapeutic intervention in order to improve the  following deficits and impairments:  Decreased activity tolerance, Decreased range of motion, Decreased strength, Difficulty walking, Impaired flexibility, Pain  Visit Diagnosis: Chronic pain of left knee  Muscle weakness  (generalized)  Cervicalgia  Localized edema     Problem List Patient Active Problem List   Diagnosis Date Noted   Other spondylosis with radiculopathy, cervical region 05/11/2021   History of fusion of cervical spine 05/11/2021   Pes anserine bursitis 05/11/2021   GERD (gastroesophageal reflux disease) 09/29/2020   Esophageal stricture 01/26/2011   Arrhythmia 12/29/2010   Essential hypertension 07/21/2010   CONTACT DERMATITIS&OTHER ECZEMA DUE UNSPEC CAUSE 04/07/2010   Hyperlipidemia 09/11/2007   Migraine headache 09/11/2007   Hypothyroidism 01/26/2007    Debbe Odea, PT,DPT 05/27/2021, 3:20 PM  [facility] Physical Therapy [address] [place], [place], [address] Phone: [phone]   Fax:  [phone]  Name: Aracelia Brinson MRN: [id number] Date of Birth: 09-30-1950

## 2021-05-27 NOTE — Telephone Encounter (Signed)
Updated patient on her labs  

## 2021-05-27 NOTE — Telephone Encounter (Signed)
Pt is returning Patricia Solis call 

## 2021-05-28 NOTE — Telephone Encounter (Signed)
Please see lab result

## 2021-05-28 NOTE — Telephone Encounter (Signed)
Attempted to call back no answer

## 2021-06-04 ENCOUNTER — Telehealth: Payer: PPO

## 2021-06-09 ENCOUNTER — Other Ambulatory Visit: Payer: Self-pay

## 2021-06-09 ENCOUNTER — Ambulatory Visit: Payer: PPO | Admitting: Physical Therapy

## 2021-06-09 ENCOUNTER — Encounter: Payer: Self-pay | Admitting: Orthopaedic Surgery

## 2021-06-09 ENCOUNTER — Ambulatory Visit: Payer: PPO | Admitting: Orthopaedic Surgery

## 2021-06-09 DIAGNOSIS — M25562 Pain in left knee: Secondary | ICD-10-CM

## 2021-06-09 DIAGNOSIS — M79662 Pain in left lower leg: Secondary | ICD-10-CM | POA: Diagnosis not present

## 2021-06-09 DIAGNOSIS — G8929 Other chronic pain: Secondary | ICD-10-CM | POA: Diagnosis not present

## 2021-06-09 DIAGNOSIS — M6281 Muscle weakness (generalized): Secondary | ICD-10-CM

## 2021-06-09 DIAGNOSIS — M2242 Chondromalacia patellae, left knee: Secondary | ICD-10-CM

## 2021-06-09 DIAGNOSIS — M542 Cervicalgia: Secondary | ICD-10-CM | POA: Diagnosis not present

## 2021-06-09 DIAGNOSIS — R600 Localized edema: Secondary | ICD-10-CM

## 2021-06-09 NOTE — Progress Notes (Signed)
Office Visit Note   Patient: Patricia Solis           Date of Birth: [birth year]           MRN: [id number] Visit Date: 06/09/2021              Requested by: Dorothyann Peng, NP [place],  [place] [address] PCP: Dorothyann Peng, NP   Assessment & Plan: Visit Diagnoses:  1. Chondromalacia patellae, left knee     Plan: Patient happy with results of therapy.  She can return as needed.  Follow-Up Instructions: No follow-ups on file.   Orders:  No orders of the defined types were placed in this encounter.  No orders of the defined types were placed in this encounter.     Procedures: No procedures performed   Clinical Data: No additional findings.   Subjective: Chief Complaint  Patient presents with   Left Knee - Follow-up   Right Hand - Follow-up   Neck - Follow-up    HPI 70 year old female returns states her knee is responded to therapy.  Previous cervical fusion Doing better.  She noticed some white lower quadrant pain no nausea no fever no vomiting no abdominal tenderness.  Left knee is better still bothers her with stairs she has more crepitus with knee extension on the left and right knee.  Review of Systems All the systems noncontributory to HPI.  Old injury to the right hand Worker's Comp. from the 90s.  Objective: Vital Signs: BP 123/61   Pulse 75   Ht 5\' 1"  (1.549 m)   Wt 148 lb (67.1 kg)   BMI 27.96 kg/m   Physical Exam Constitutional:      Appearance: She is well-developed.  HENT:     Head: Normocephalic.     Right Ear: External ear normal.     Left Ear: External ear normal. There is no impacted cerumen.  Eyes:     Pupils: Pupils are equal, round, and reactive to light.  Neck:     Thyroid: No thyromegaly.     Trachea: No tracheal deviation.  Cardiovascular:     Rate and Rhythm: Normal rate.  Pulmonary:     Effort: Pulmonary effort is normal.  Abdominal:     Palpations: Abdomen is soft.  Musculoskeletal:      Cervical back: No rigidity.  Skin:    General: Skin is warm and dry.  Neurological:     Mental Status: She is alert and oriented to person, place, and time.  Psychiatric:        Behavior: Behavior normal.    Ortho Exam patient is amatory without limping.  Mild crepitus left knee more than right knee with extension she reached full extension flexion of 20 degrees collateral ligaments are stable.  Scars dorsum transverse fingers right hand with inability to flex fingertips to distal palmar crease.  No triggering.  Specialty Comments:  No specialty comments available.  Imaging: No results found.   PMFS History: Patient Active Problem List   Diagnosis Date Noted   Chondromalacia patellae, left knee 06/09/2021   Other spondylosis with radiculopathy, cervical region 05/11/2021   History of fusion of cervical spine 05/11/2021   Pes anserine bursitis 05/11/2021   GERD (gastroesophageal reflux disease) 09/29/2020   Esophageal stricture 01/26/2011   Arrhythmia 12/29/2010   Essential hypertension 07/21/2010   CONTACT DERMATITIS&OTHER ECZEMA DUE UNSPEC CAUSE 04/07/2010   Hyperlipidemia 09/11/2007   Migraine headache 09/11/2007   Hypothyroidism 01/26/2007  Past Medical History:  Diagnosis Date   Basal cell carcinoma 2007   [facility]   Breast cancer ([place])    Invasive lobular carcinoma RIGHT breast   Carotid bruit 08/2008   Normal Doppler   DUB (dysfunctional uterine bleeding)    Esophagitis    HTN (hypertension)    Tx started 06/2010   Hyperlipidemia    Hypothyroidism    MHA (microangiopathic hemolytic anemia) ([facility])    Personal history of chemotherapy    2003   Personal history of radiation therapy    2003 right breast   Spinal stenosis     Family History  Problem Relation Age of Onset   Heart attack Father    Coronary artery disease Father    Diabetes Father    Coronary artery disease Mother    Leukemia Mother    Thyroid disease Mother    Multiple sclerosis Sister         Twin   Breast cancer Maternal Aunt     Past Surgical History:  Procedure Laterality Date   BREAST BIOPSY Right    2007 benign   BREAST LUMPECTOMY Right    2003   BTL     CATARACT EXTRACTION, BILATERAL Bilateral 03/26/2016   still blurred vision; Md states this is fine   CERVICAL LAMINECTOMY     Left   FOOT SURGERY     SHOULDER ARTHROSCOPY Right    TUBAL LIGATION  1981   Social History   Occupational History   Not on file  Tobacco Use   Smoking status: Never   Smokeless tobacco: Never  Substance and Sexual Activity   Alcohol use: No   Drug use: No   Sexual activity: Not on file

## 2021-06-09 NOTE — Therapy (Signed)
[facility] Physical Therapy [address] [place], [place], [address] Phone: [phone]   Fax:  [phone]  Physical Therapy Treatment  Patient Details  Name: Patricia Solis MRN: [id number] Date of Birth: 1950/09/14 Referring Provider (PT): Lorin Mercy, MD   Encounter Date: 06/09/2021   PT End of Session - 06/09/21 1004     Visit Number 4    Number of Visits 10    Date for PT Re-Evaluation 06/25/21    PT Start Time 0930    PT Stop Time 1010    PT Time Calculation (min) 40 min    Activity Tolerance Patient tolerated treatment well    Behavior During Therapy [facility] for tasks assessed/performed             Past Medical History:  Diagnosis Date   Basal cell carcinoma 2007   [place]   Breast cancer ([place])    Invasive lobular carcinoma RIGHT breast   Carotid bruit 08/2008   Normal Doppler   DUB (dysfunctional uterine bleeding)    Esophagitis    HTN (hypertension)    Tx started 06/2010   Hyperlipidemia    Hypothyroidism    MHA (microangiopathic hemolytic anemia) ([place])    Personal history of chemotherapy    2003   Personal history of radiation therapy    2003 right breast   Spinal stenosis     Past Surgical History:  Procedure Laterality Date   BREAST BIOPSY Right    2007 benign   BREAST LUMPECTOMY Right    2003   BTL     CATARACT EXTRACTION, BILATERAL Bilateral 03/26/2016   still blurred vision; Md states this is fine   CERVICAL LAMINECTOMY     Left   FOOT SURGERY     SHOULDER ARTHROSCOPY Right    TUBAL LIGATION  1981    There were no vitals filed for this visit.   Subjective Assessment - 06/09/21 1003     Subjective she is not really having pain upon arrival but does have some soreness in the front and back of her knee at times.    How long can you walk comfortably? a mile sometimes, sometimes cant    Diagnostic tests knee XR "negative for acute or degenerative changes" neck XR "Solid C5-C7 fusion with spondylosis above and below the  fusion."    Patient Stated Goals reduce the pain    Pain Onset More than a month ago              [facility] Adult PT Treatment/Exercise - 06/09/21 0001       Knee/Hip Exercises: Stretches   Active Hamstring Stretch 2 reps;30 seconds;Both    Active Hamstring Stretch Limitations supine with strap    Quad Stretch Both;3 reps;30 seconds    Quad Stretch Limitations supine leg off EOB with strap    Gastroc Stretch Both;3 reps;30 seconds    Gastroc Stretch Limitations slantboard      Knee/Hip Exercises: Aerobic   Recumbent Bike L3 X10 min      Knee/Hip Exercises: Machines for Strengthening   Cybex Knee Extension DL 5# 3X10    Cybex Knee Flexion DL 25# 3X10    Cybex Leg Press 125# DL 3X15, then SL only 50# 2X15 bilat      Knee/Hip Exercises: Standing   Lateral Step Up Both;10 reps;Hand Hold: 1;Step Height: 6"    Forward Step Up Both;Hand Hold: 1;Step Height: 6";15 reps      Knee/Hip Exercises: Supine   Straight Leg Raises Both;15  reps      Knee/Hip Exercises: Sidelying   Hip ABduction Both;15 reps   some pain on Lt side with this                      PT Short Term Goals - 06/09/21 1007       PT SHORT TERM GOAL #1   Title Ind with initial HEP    Time 4    Period Weeks    Status Achieved    Target Date 06/11/21      PT SHORT TERM GOAL #2   Title Patient to report decreased pain in left knee pain by 25% with ADLS.    Time 4    Period Weeks    Status Achieved    Target Date 06/11/21      PT SHORT TERM GOAL #3   Title -               PT Long Term Goals - 06/09/21 1020       PT LONG TERM GOAL #1   Title Improve FOTO to >72% functional    Time 6    Period Weeks    Status On-going      PT LONG TERM GOAL #2   Title Patient to report improvement in left leg pain by 50% with ADLs.    Time 6    Period Weeks    Status On-going      PT LONG TERM GOAL #3   Title Improved LE hip/knee strength to 5/5 MMT tested in sitting to improve function.     Time 6    Period Weeks    Status On-going      PT LONG TERM GOAL #4   Title Improve Lt knee AROM to at least 125 deg to improve mobility    Baseline 115    Time 6    Period Weeks    Status On-going      PT LONG TERM GOAL #5   Title Improved 5x sit to stand to <= 8 seconds to show improved leg endurance and balance    Baseline 10 sec    Time 8    Status On-going                   Plan - 06/09/21 1005     Clinical Impression Statement She appeared to have overall good MD follow up appointment and does not need to go back unless needed. We continued with hip/knee strength with gradual progression today with good overall tolerance to this. She has now met her STGs.    Personal Factors and Comorbidities Comorbidity 3+    Comorbidities MYT:RZNBVAPO fusion,GERD,breast CA    Examination-Activity Limitations Bend;Lift;Stand;Stairs;Squat;Locomotion Level    Examination-Participation Restrictions Cleaning;Community Activity    Stability/Clinical Decision Making Evolving/Moderate complexity    Rehab Potential Good    PT Frequency 2x / week   1-2   PT Duration 6 weeks    PT Treatment/Interventions Cryotherapy;Electrical Stimulation;Iontophoresis 58m/ml Dexamethasone;Ultrasound;Moist Heat;Gait training;Therapeutic activities;Therapeutic exercise;Neuromuscular re-education;Manual techniques;Taping;Vasopneumatic Device;Joint Manipulations;Dry needling;Passive range of motion    PT Next Visit Plan needs hip strength and quad strength focus to her tolerance, will focus on knee for now but can address neck if needed    PT Home Exercise Plan Access Code: [email or web address]   Consulted and Agree with Plan of Care Patient             Patient will benefit from skilled therapeutic intervention in  order to improve the following deficits and impairments:  Decreased activity tolerance, Decreased range of motion, Decreased strength, Difficulty walking, Impaired flexibility, Pain  Visit  Diagnosis: Chronic pain of left knee  Muscle weakness (generalized)  Cervicalgia  Localized edema  Pain in left lower leg     Problem List Patient Active Problem List   Diagnosis Date Noted   Chondromalacia patellae, left knee 06/09/2021   Other spondylosis with radiculopathy, cervical region 05/11/2021   History of fusion of cervical spine 05/11/2021   Pes anserine bursitis 05/11/2021   GERD (gastroesophageal reflux disease) 09/29/2020   Esophageal stricture 01/26/2011   Arrhythmia 12/29/2010   Essential hypertension 07/21/2010   CONTACT DERMATITIS&OTHER ECZEMA DUE UNSPEC CAUSE 04/07/2010   Hyperlipidemia 09/11/2007   Migraine headache 09/11/2007   Hypothyroidism 01/26/2007    Debbe Odea, PT,DPT 06/09/2021, 10:39 AM  [facility] Physical Therapy [address] [place], [place], [address] Phone: [phone]   Fax:  [phone]  Name: Patricia Solis MRN: [id number] Date of Birth: 1951-04-03

## 2021-06-23 ENCOUNTER — Ambulatory Visit: Payer: PPO | Admitting: Physical Therapy

## 2021-06-23 ENCOUNTER — Other Ambulatory Visit: Payer: Self-pay

## 2021-06-23 DIAGNOSIS — M542 Cervicalgia: Secondary | ICD-10-CM

## 2021-06-23 DIAGNOSIS — G8929 Other chronic pain: Secondary | ICD-10-CM

## 2021-06-23 DIAGNOSIS — M25562 Pain in left knee: Secondary | ICD-10-CM

## 2021-06-23 DIAGNOSIS — M6281 Muscle weakness (generalized): Secondary | ICD-10-CM | POA: Diagnosis not present

## 2021-06-23 NOTE — Therapy (Addendum)
[facility] Physical Therapy [address] [place], [place], [address] Phone: [phone]   Fax:  [phone]  Physical Therapy Treatment/Discharge addendum PHYSICAL THERAPY DISCHARGE SUMMARY  Visits from Start of Care: 5  Current functional level related to goals / functional outcomes: See below   Remaining deficits: See below   Education / Equipment: HEP Plan: Patient agrees to discharge.  Patient goals were mostly met. Patient is being discharged due to transition to independent program and has not returned to PT      Patient Details  Name: Patricia Solis MRN: [id number] Date of Birth: Dec 23, 1950 Referring Provider (PT): Lorin Mercy, MD   Encounter Date: 06/23/2021   PT End of Session - 06/23/21 0953     Visit Number 5    Number of Visits 10    Date for PT Re-Evaluation 06/25/21    PT Start Time 0930    PT Stop Time 1010    PT Time Calculation (min) 40 min    Activity Tolerance Patient tolerated treatment well    Behavior During Therapy [facility] for tasks assessed/performed             Past Medical History:  Diagnosis Date   Basal cell carcinoma 2007   [place]   Breast cancer ([place])    Invasive lobular carcinoma RIGHT breast   Carotid bruit 08/2008   Normal Doppler   DUB (dysfunctional uterine bleeding)    Esophagitis    HTN (hypertension)    Tx started 06/2010   Hyperlipidemia    Hypothyroidism    MHA (microangiopathic hemolytic anemia) ([place])    Personal history of chemotherapy    2003   Personal history of radiation therapy    2003 right breast   Spinal stenosis     Past Surgical History:  Procedure Laterality Date   BREAST BIOPSY Right    2007 benign   BREAST LUMPECTOMY Right    2003   BTL     CATARACT EXTRACTION, BILATERAL Bilateral 03/26/2016   still blurred vision; Md states this is fine   CERVICAL LAMINECTOMY     Left   FOOT SURGERY     SHOULDER ARTHROSCOPY Right    TUBAL LIGATION  1981    There were no vitals filed  for this visit.       [facility] PT Assessment - 06/23/21 0001       Assessment   Medical Diagnosis Chronic pain of Lt knee, bursitis, chronic neck pain with history of fusion C5-7    Referring Provider (PT) Lorin Mercy, MD      Observation/Other Assessments   Focus on Therapeutic Outcomes (FOTO)  68% fucntional, goal was 72%      AROM   Overall AROM Comments now 0-125 deg for Lt knee and met goal for this      Strength   Overall Strength Comments Hip strength grossly 4+/5, knee strength has improved to 5/5 bilat      Standardized Balance Assessment   Five times sit to stand comments  7.69 sec                           [facility] Adult PT Treatment/Exercise - 06/23/21 0001       Knee/Hip Exercises: Stretches   Gastroc Stretch Both;3 reps;30 seconds    Gastroc Stretch Limitations slantboard      Knee/Hip Exercises: Aerobic   Nustep L5 X8 min      Knee/Hip Exercises: Field seismologist for Strengthening  Cybex Knee Extension DL 20# 3X10    Cybex Knee Flexion DL 25# 2X15    Cybex Leg Press 125# DL 3X15, then SL only 50# 2X15 bilat      Knee/Hip Exercises: Supine   Straight Leg Raises Both;20 reps      Knee/Hip Exercises: Sidelying   Hip ABduction Both;15 reps                       PT Short Term Goals - 06/09/21 1007       PT SHORT TERM GOAL #1   Title Ind with initial HEP    Time 4    Period Weeks    Status Achieved    Target Date 06/11/21      PT SHORT TERM GOAL #2   Title Patient to report decreased pain in left knee pain by 25% with ADLS.    Time 4    Period Weeks    Status Achieved    Target Date 06/11/21      PT SHORT TERM GOAL #3   Title -               PT Long Term Goals - 06/23/21 1001       PT LONG TERM GOAL #1   Title Improve FOTO to >72% functional    Baseline now 68%    Time 6    Period Weeks    Status Partially Met      PT LONG TERM GOAL #2   Title Patient to report improvement in left leg pain by 50% with ADLs.     Time 6    Period Weeks    Status Achieved      PT LONG TERM GOAL #3   Title Improved LE hip/knee strength to 5/5 MMT tested in sitting to improve function.    Baseline 4+ hip strength and now 5 knee strength    Time 6    Period Weeks    Status Partially Met      PT LONG TERM GOAL #4   Title Improve Lt knee AROM to at least 125 deg to improve mobility    Baseline now 125 deg    Time 6    Period Weeks    Status Achieved      PT LONG TERM GOAL #5   Title Improved 5x sit to stand to <= 8 seconds to show improved leg endurance and balance    Baseline now 7.7 sec    Time 8    Status Achieved                   Plan - 06/23/21 0956     Clinical Impression Statement She has made good overall progress with her PT, overall not as much pain but still has some days with increased pain. She feels she is at a good functional level and would like to hold PT for now to trial independent program and will continue to exercise at [facility]. She was encouraged to continue with her HEP and to follow up with PT if needed. If we do not hear from her in 60 days we will close out her case then.    Personal Factors and Comorbidities Comorbidity 3+    Comorbidities LOV:FIEPPIRJ fusion,GERD,breast CA    Examination-Activity Limitations Bend;Lift;Stand;Stairs;Squat;Locomotion Level    Examination-Participation Restrictions Cleaning;Community Activity    Stability/Clinical Decision Making Evolving/Moderate complexity    Rehab Potential Good    PT Frequency 2x /  week   1-2   PT Duration 6 weeks    PT Treatment/Interventions Cryotherapy;Electrical Stimulation;Iontophoresis 74m/ml Dexamethasone;Ultrasound;Moist Heat;Gait training;Therapeutic activities;Therapeutic exercise;Neuromuscular re-education;Manual techniques;Taping;Vasopneumatic Device;Joint Manipulations;Dry needling;Passive range of motion    PT Next Visit Plan hold PT to trial independent program    PT Home Exercise Plan Access Code: [email or web address]    Consulted and Agree with Plan of Care Patient             Patient will benefit from skilled therapeutic intervention in order to improve the following deficits and impairments:  Decreased activity tolerance, Decreased range of motion, Decreased strength, Difficulty walking, Impaired flexibility, Pain  Visit Diagnosis: Chronic pain of left knee  Muscle weakness (generalized)  Cervicalgia     Problem List Patient Active Problem List   Diagnosis Date Noted   Chondromalacia patellae, left knee 06/09/2021   Other spondylosis with radiculopathy, cervical region 05/11/2021   History of fusion of cervical spine 05/11/2021   Pes anserine bursitis 05/11/2021   GERD (gastroesophageal reflux disease) 09/29/2020   Esophageal stricture 01/26/2011   Arrhythmia 12/29/2010   Essential hypertension 07/21/2010   CONTACT DERMATITIS&OTHER ECZEMA DUE UNSPEC CAUSE 04/07/2010   Hyperlipidemia 09/11/2007   Migraine headache 09/11/2007   Hypothyroidism 01/26/2007    BDebbe Odea PT,DPT 06/23/2021, 10:11 AM  [facility] [address] [address]Phone: [phone]  Fax:  [phone] Name: DKatiya FikeMRN: [id number] of Birth: 8Nov 22, 1952

## 2021-06-30 ENCOUNTER — Telehealth: Payer: Self-pay | Admitting: Pharmacist

## 2021-06-30 NOTE — Chronic Care Management (AMB) (Signed)
    Chronic Care Management Pharmacy Assistant   Name: Patricia Solis  MRN: [id number] DOB: September 22, 1950  06/30/21 APPOINTMENT REMINDER   Called Patient No answer, left message of appointment on 07/01/21 at 4 via telephone visit with Jeni Salles, Pharm D.   Notified to have all medications, supplements, blood pressure and/or blood sugar logs available during appointment and to return call if need to reschedule.   Care Gaps:  CCM Call - Scheduled 07-01-21 BP- (123/61 06/09/21) AWV- 9/22  Star Rating Drug: Rosuvastatin 40 mg - Last filled 04/19/2021 90 DS at [facility]  Any gaps in medications fill history? None      Medications: Outpatient Encounter Medications as of 06/30/2021  Medication Sig Note   aspirin 81 MG EC tablet Take 81 mg by mouth daily.    B Complex-C (B-COMPLEX WITH VITAMIN C) tablet Take 1 tablet by mouth daily.    calcium carbonate (OS-CAL) 600 MG TABS tablet Take 1 tablet (600 mg total) by mouth 2 (two) times daily with a meal.    Cholecalciferol (VITAMIN D3 PO) Take by mouth. 06/30/2020: 5,000 iu qd   Coenzyme Q10 200 MG TABS Take 200 mg by mouth daily.    CRANBERRY PO Take 2 tablets by mouth at bedtime.    famotidine (PEPCID) 40 MG tablet Take 1 tablet (40 mg total) by mouth at bedtime.    GLUCOSAMINE PO Take 1 tablet by mouth 2 (two) times daily.    hydrochlorothiazide (HYDRODIURIL) 25 MG tablet Take 1 tablet (25 mg total) by mouth daily.    hydrocortisone 2.5 % ointment Apply topically 2 (two) times daily. 03/26/2020: Prn skin rash   levothyroxine (EUTHYROX) 75 MCG tablet Take 1 tablet (75 mcg total) by mouth daily before breakfast.    magnesium oxide (MAG-OX) 400 MG tablet Take 400 mg by mouth daily.    omeprazole (PRILOSEC) 20 MG capsule Take 1 capsule (20 mg total) by mouth daily.    rosuvastatin (CRESTOR) 40 MG tablet Take 1 tablet (40 mg total) by mouth daily.    No facility-administered encounter medications on file as of 06/30/2021.         [place] Clinical Pharmacist Assistant [phone]

## 2021-07-01 ENCOUNTER — Ambulatory Visit (INDEPENDENT_AMBULATORY_CARE_PROVIDER_SITE_OTHER): Payer: PPO | Admitting: Pharmacist

## 2021-07-01 DIAGNOSIS — I10 Essential (primary) hypertension: Secondary | ICD-10-CM

## 2021-07-01 DIAGNOSIS — E782 Mixed hyperlipidemia: Secondary | ICD-10-CM

## 2021-07-01 NOTE — Progress Notes (Signed)
Chronic Care Management Pharmacy Note  07/06/2021 Name:  Patricia Solis MRN:  [id number] DOB:  Dec 15, 1950  Summary: LDL at goal < 100 BP at goal of < 140/90 per home readings   Recommendations/Changes made from today's visit: -Recommended continued BP monitoring at home -Mailed healthy plate handout   Plan: Follow up BP assessment in 6 months  Subjective: Patricia Solis is an 70 y.o. year old female who is a primary patient of Dorothyann Peng, NP.  The CCM team was consulted for assistance with disease management and care coordination needs.    Engaged with patient by telephone for follow up visit in response to provider referral for pharmacy case management and/or care coordination services.   Consent to Services:  The patient was given information about Chronic Care Management services, agreed to services, and gave verbal consent prior to initiation of services.  Please see initial visit note for detailed documentation.   Patient Care Team: Dorothyann Peng, NP as PCP - General (Family Medicine) Patricia Solis, [facility] as Pharmacist (Pharmacist)  Recent office visits: 05/26/21 Dorothyann Peng, NP: Patient presented for annual exam. Influenza vaccine administered. No medication changes.  04/08/2021 Patricia Brace, LPN - Patient presented for [facility] Annual Wellness Exam.  Recent consult visits: 06/23/21 Patricia Solis, PT (PT): Patient presented for PT treatment for left knee.  06/09/21 Patricia Perna, MD (ortho): Patient presented for left knee, right hand and neck follow up. Follow up PRN.  05/05/21 Patricia Perna, MD (ortho): Patient presented for left knee, right hand and neck follow up. Plan for PT. Follow up in 5 weeks.  03/05/21 Patricia Solis (dermatology): Unable to access notes.  02/23/2021 Patient presented to [facility] for Mammogram.  01/30/21 Patricia Solis (OBGYN): Patient presented for exam. Unable to access notes.   12/16/2020 Patricia Solis  (Ophthalmology) - Patient presented for Hypertensive retinopathy and other concerns. No medication changes noted.   12/03/2020 Patricia Hopes MD (Ophthalmology) - Patient presented for Post-op exam  No medication changes noted.  Hospital visits: None in previous 6 months  Objective:  Lab Results  Component Value Date   CREATININE 0.71 05/26/2021   BUN 19 05/26/2021   GFR 86.25 05/26/2021   GFRNONAA 91 05/22/2020   GFRAA 106 05/22/2020   NA 139 05/26/2021   K 3.9 05/26/2021   CALCIUM 9.9 05/26/2021   CO2 30 05/26/2021   GLUCOSE 95 05/26/2021    Lab Results  Component Value Date/Time   HGBA1C 5.4 05/22/2020 08:26 AM   GFR 86.25 05/26/2021 08:33 AM   GFR 88.95 07/24/2019 07:44 AM    Last diabetic Eye exam:  Lab Results  Component Value Date/Time   HMDIABEYEEXA No Retinopathy 03/04/2018 12:00 AM    Last diabetic Foot exam: No results found for: HMDIABFOOTEX   Lab Results  Component Value Date   CHOL 178 05/26/2021   HDL 67.70 05/26/2021   LDLCALC 93 05/26/2021   LDLDIRECT 140.4 09/02/2006   TRIG 85.0 05/26/2021   CHOLHDL 3 05/26/2021    Hepatic Function Latest Ref Rng & Units 05/26/2021 05/22/2020 07/09/2019  Total Protein 6.0 - 8.3 g/dL 7.4 7.0 6.4(L)  Albumin 3.5 - 5.2 g/dL 4.5 - 3.7  AST 0 - 37 U/L 20 15 137(H)  ALT 0 - 35 U/L 20 15 84(H)  Alk Phosphatase 39 - 117 U/L 64 - 79  Total Bilirubin 0.2 - 1.2 mg/dL 0.5 0.5 0.6  Bilirubin, Direct 0.0 - 0.2 mg/dL - - 0.1    Lab Results  Component Value Date/Time   TSH 0.93 05/26/2021 08:33 AM   TSH 2.92 05/22/2020 08:26 AM   FREET4 0.80 12/29/2010 04:56 PM    CBC Latest Ref Rng & Units 05/26/2021 05/22/2020 07/09/2019  WBC 4.0 - 10.5 K/uL 6.2 5.8 -  Hemoglobin 12.0 - 15.0 g/dL 13.9 14.3 13.9  Hematocrit 36.0 - 46.0 % 41.5 43.0 41.0  Platelets 150.0 - 400.0 K/uL 273.0 304 -    Lab Results  Component Value Date/Time   VD25OH 47 05/22/2020 08:26 AM   VD25OH 26 (L) 03/26/2020 09:51 AM   VD25OH 43.86  04/27/2016 07:43 AM    Clinical ASCVD: No  The 10-year ASCVD risk score (Arnett DK, et al., 2019) is: 20%   Values used to calculate the score:     Age: 32 years     Sex: Female     Is Non-Hispanic African American: No     Diabetic: Yes     Tobacco smoker: No     Systolic Blood Pressure: 158 mmHg     Is BP treated: Yes     HDL Cholesterol: 67.7 mg/dL     Total Cholesterol: 178 mg/dL    Depression screen [facility] 2/9 05/26/2021 04/08/2021 05/23/2020  Decreased Interest 0 0 0  Down, Depressed, Hopeless 0 0 0  PHQ - 2 Score 0 0 0  Altered sleeping 0 - 0  Tired, decreased energy 0 - 0  Change in appetite 0 - 0  Feeling bad or failure about yourself  0 - 0  Trouble concentrating 0 - 0  Moving slowly or fidgety/restless 0 - 0  Suicidal thoughts 0 - 0  PHQ-9 Score 0 - 0  Difficult doing work/chores - - Not difficult at all  Some recent data might be hidden      Social History   Tobacco Use  Smoking Status Never  Smokeless Tobacco Never   BP Readings from Last 3 Encounters:  06/09/21 123/61  05/26/21 120/64  09/29/20 128/70   Pulse Readings from Last 3 Encounters:  06/09/21 75  05/26/21 75  09/29/20 82   Wt Readings from Last 3 Encounters:  06/09/21 148 lb (67.1 kg)  05/26/21 148 lb 8 oz (67.4 kg)  09/29/20 153 lb (69.4 kg)   BMI Readings from Last 3 Encounters:  06/09/21 27.96 kg/m  05/26/21 28.06 kg/m  09/29/20 28.44 kg/m    Assessment/Interventions: Review of patient past medical history, allergies, medications, health status, including review of consultants reports, laboratory and other test data, was performed as part of comprehensive evaluation and provision of chronic care management services.   SDOH:  (Social Determinants of Health) assessments and interventions performed: No  SDOH Screenings   Alcohol Screen: Not on file  Depression (PHQ2-9): Low Risk    PHQ-2 Score: 0  Financial Resource Strain: Low Risk    Difficulty of Paying Living Expenses: Not  hard at all  Food Insecurity: No Food Insecurity   Worried About Charity fundraiser in the Last Year: Never true   Ran Out of Food in the Last Year: Never true  Housing: Low Risk    Last Housing Risk Score: 0  Physical Activity: Sufficiently Active   Days of Exercise per Week: 3 days   Minutes of Exercise per Session: 120 min  Social Connections: Moderately Integrated   Frequency of Communication with Friends and Family: Twice a week   Frequency of Social Gatherings with Friends and Family: More than three times a week   Attends Religious  Services: 1 to 4 times per year   Active Member of Clubs or Organizations: No   Attends Archivist Meetings: Never   Marital Status: Married  Stress: No Stress Concern Present   Feeling of Stress : Not at all  Tobacco Use: Low Risk    Smoking Tobacco Use: Never   Smokeless Tobacco Use: Never   Passive Exposure: Not on file  Transportation Needs: No Transportation Needs   Lack of Transportation (Medical): No   Lack of Transportation (Non-Medical): No    CCM Care Plan  Allergies  Allergen Reactions   Ambien Cr [Zolpidem] Other (See Comments)    Hallucinations   Cozaar [Losartan] Cough   Erythromycin     REACTION: rash   Hydrocodone Other (See Comments)    Hallucinations   Lisinopril Cough   Penicillins     REACTION: rash   Clindamycin/Lincomycin Rash    Medications Reviewed Today     Reviewed by Patricia Solis, [facility] (Pharmacist) on 07/06/21 at 1255  Med List Status: <None>   Medication Order Taking? Sig Documenting Provider Last Dose Status Informant  aspirin 81 MG EC tablet [id number] No Take 81 mg by mouth daily. [provider] Taking Active Self  B Complex-C (B-COMPLEX WITH VITAMIN C) tablet [id number] No Take 1 tablet by mouth daily. [provider] Taking Active Self  calcium carbonate (OS-CAL) 600 MG TABS tablet [id number] No Take 1 tablet (600 mg total) by mouth 2 (two) times daily with a meal.  Nafziger, Tommi Rumps, NP Taking Active Self  Cholecalciferol (VITAMIN D3 PO) [id number] No Take by mouth. [provider] Taking Active            Med Note Marlyne Beards   Mon Jun 30, 2020  9:15 AM) 5,000 iu qd  Coenzyme Q10 200 MG TABS [id number] No Take 200 mg by mouth daily. [provider] Taking Active   CRANBERRY PO [id number] No Take 2 tablets by mouth at bedtime. [provider] Taking Active   famotidine (PEPCID) 40 MG tablet [id number] No Take 1 tablet (40 mg total) by mouth at bedtime. Dorothyann Peng, NP Taking Active   GLUCOSAMINE PO [id number] No Take 1 tablet by mouth 2 (two) times daily. [provider] Taking Active Self  hydrochlorothiazide (HYDRODIURIL) 25 MG tablet [id number] No Take 1 tablet (25 mg total) by mouth daily. Nafziger, Tommi Rumps, NP Taking Active   hydrocortisone 2.5 % ointment [id number] No Apply topically 2 (two) times daily. [provider] Taking Active            Med Note Edison Pace, TERRI M   Wed Mar 26, 2020  9:26 AM) Prn skin rash  levothyroxine (EUTHYROX) 75 MCG tablet [id number] No Take 1 tablet (75 mcg total) by mouth daily before breakfast. Dorothyann Peng, NP Taking Active   magnesium oxide (MAG-OX) 400 MG tablet [id number] No Take 400 mg by mouth daily. [provider] Taking Active   omeprazole (PRILOSEC) 20 MG capsule [id number] No Take 1 capsule (20 mg total) by mouth daily. Nafziger, Tommi Rumps, NP Taking Active   rosuvastatin (CRESTOR) 40 MG tablet [id number] No Take 1 tablet (40 mg total) by mouth daily. Dorothyann Peng, NP Taking Active             Patient Active Problem List   Diagnosis Date Noted   Chondromalacia patellae, left knee 06/09/2021   Other spondylosis with radiculopathy, cervical region 05/11/2021   History of fusion of cervical spine 05/11/2021   Pes anserine  bursitis 05/11/2021   GERD (gastroesophageal reflux disease) 09/29/2020   Esophageal stricture 01/26/2011   Arrhythmia 12/29/2010   Essential  hypertension 07/21/2010   CONTACT DERMATITIS&OTHER ECZEMA DUE UNSPEC CAUSE 04/07/2010   Hyperlipidemia 09/11/2007   Migraine headache 09/11/2007   Hypothyroidism 01/26/2007    Immunization History  Administered Date(s) Administered   Fluad Quad(high Dose 65+) 05/11/2019, 05/22/2020, 05/26/2021   Influenza Whole 07/26/2005, 04/25/2009   Influenza, High Dose Seasonal PF 04/27/2016, 04/28/2017, 05/02/2018   Influenza,inj,quad, With Preservative 04/25/2017   PFIZER(Purple Top)SARS-COV-2 Vaccination 09/01/2019, 09/26/2019, 04/21/2020, 10/22/2020   Pfizer Covid-19 Vaccine Bivalent Booster 55yr & up 04/08/2021   Pneumococcal Conjugate-13 04/27/2016   Pneumococcal Polysaccharide-23 04/28/2017   Td 07/26/2001   Tdap 09/21/2011   Zoster Recombinat (Shingrix) 04/20/2017, 08/11/2017, 09/08/2017   Zoster, Live 07/11/2012   Patient is going to the [employer] 3 days a week. She uses 5-6 machines, walking for a mile and doing a silver sneakers program. Patient reports the biggest change to diet recently has been since her husband was diagnosed with diabetes. She report she is is trying to cook like she is supposed to but not quite sure if she is on the right track.  Conditions to be addressed/monitored:  Hypertension, Hyperlipidemia, GERD, Hypothyroidism and Prevention of heart events  Conditions addressed this visit: Hypertension, hyperlipidemia  Care Plan : CCM Pharmacy Care Plan  Updates made by PViona Solis RThomastonsince 07/06/2021 12:00 AM     Problem: Problem: Hypertension, Hyperlipidemia, GERD, Hypothyroidism and Prevention of heart events      Long-Range Goal: Patient-Specific Goal   Start Date: 11/27/2020  Expected End Date: 11/27/2021  Recent Progress: On track  Priority: High  Note:   Current Barriers:  Unable to independently monitor therapeutic efficacy Unable to achieve control of cholesterol   Pharmacist Clinical Goal(s):  Patient will achieve adherence to monitoring  guidelines and medication adherence to achieve therapeutic efficacy through collaboration with PharmD and provider.   Interventions: 1:1 collaboration with NDorothyann Peng NP regarding development and update of comprehensive plan of care as evidenced by provider attestation and co-signature Inter-disciplinary care team collaboration (see longitudinal plan of care) Comprehensive medication review performed; medication list updated in electronic medical record  Hypertension (BP goal <140/90) -Controlled -Current treatment: Hydrochlorothiazide 25 mg 1 tablet daily -Medications previously tried: none -Current home readings: 119/65, 123/68, 129/71, 121/63, 123/66, 126/67 (checking once a week) -Current dietary habits: puts some salt in foods but not on the table (only to season) -[facility] -Denies hypotensive/hypertensive symptoms -Educated on Exercise goal of 150 minutes per week; Importance of home blood pressure monitoring; Proper BP monitoring technique; -Counseled to monitor BP at home weekly, document, and provide log at future appointments -Counseled on diet and exercise extensively Recommended to continue current medication  Hyperlipidemia: (LDL goal < 100) -Controlled -Current treatment: Rosuvastatin 40 mg 1 tablet daily -Medications previously tried: simvastatin (ineffective) -Current dietary patterns: did not discuss -Current exercise habits: [facility], Wednesday, and Friday -Educated on Cholesterol goals;  Importance of limiting foods high in cholesterol; Exercise goal of 150 minutes per week; -Counseled on diet and exercise extensively Recommended to continue current medication Recommended repeat lipid panel  GERD (Goal: minimize symptoms) -Controlled -Current treatment  Omeprazole 20 mg 1 capsule daily Famotidine 20 mg 1 tablet at bedtime -Medications  previously tried: none  -Counseled on Non-pharmacologic management of symptoms such as elevating the head  of your bed, avoiding eating 2-3 hours before bed, avoiding triggering foods such as acidic, spicy, or fatty foods, eating smaller meals, and wearing clothes that are loose around the waist  Health Maintenance -Vaccine gaps: none -Current therapy:  Aspirin 81 mg 1 tablet daily Vitamin B complex w/vitamin C 1 tablet daily Calcium carbonate 600 mg 1 tablet twice daily Vitamin D3 5000 units 1 tablet daily Coenzyme Q10 30 mg 1 capsule daily Cranberry daily Glucosamine 1 tablet twice daily Hydrocortisone 2.5% ointment apply twice daily Magnesium oxide 400 mg 1 tablet daily -Educated on Cost vs benefit of each product must be carefully weighed by individual consumer -Patient is satisfied with current therapy and denies issues -Recommended to continue current medication  Patient Goals/Self-Care Activities Patient will:  - take medications as prescribed check blood pressure weekly, document, and provide at future appointments target a minimum of 150 minutes of moderate intensity exercise weekly  Follow Up Plan: Telephone follow up appointment with care management team member scheduled for: 1 year       Medication Assistance: None required.  Patient affirms current coverage meets needs.  Compliance/Adherence/Medication fill history: Care Gaps: Last PCP BP: 123/61   Star-Rating Drugs: Rosuvastatin 40 mg - Last filled 04/19/2021 90 DS at [facility]  Patient's preferred pharmacy is:  [facility] ([facility]), [place] - [address] [place] ([place]) [place] [address] Phone: [phone] Fax: [phone]  Uses pill box? No - cabinet- morning on the left and nighttime on the right Pt endorses 99% compliance  We discussed: Current pharmacy is preferred with insurance plan and patient is satisfied with pharmacy services Patient decided to: Continue  current medication management strategy  Care Plan and Follow Up Patient Decision:  Patient agrees to Care Plan and Follow-up.  Plan: Telephone follow up appointment with care management team member scheduled for:  1 year  Jeni Salles, PharmD [place] Pharmacist [employer] at [place] [phone]

## 2021-07-06 NOTE — Patient Instructions (Signed)
Hi Docia,  It was great to catch up with you again! As we discussed, here is the website with several recipes that are diabetes friendly: [email or web address] . I also mailed you that other handout I was referring to and I hope it helps.  Please reach out to me if you have any questions or need anything before our follow up!  Best, Maddie  Jeni Salles, PharmD, [place] [facility] [place]   Visit Information   Goals Addressed   None    Patient Care Plan: CCM Pharmacy Care Plan     Problem Identified: Problem: Hypertension, Hyperlipidemia, GERD, Hypothyroidism and Prevention of heart events      Long-Range Goal: Patient-Specific Goal   Start Date: 11/27/2020  Expected End Date: 11/27/2021  Recent Progress: On track  Priority: High  Note:   Current Barriers:  Unable to independently monitor therapeutic efficacy Unable to achieve control of cholesterol   Pharmacist Clinical Goal(s):  Patient will achieve adherence to monitoring guidelines and medication adherence to achieve therapeutic efficacy through collaboration with PharmD and provider.   Interventions: 1:1 collaboration with Dorothyann Peng, NP regarding development and update of comprehensive plan of care as evidenced by provider attestation and co-signature Inter-disciplinary care team collaboration (see longitudinal plan of care) Comprehensive medication review performed; medication list updated in electronic medical record  Hypertension (BP goal <140/90) -Controlled -Current treatment: Hydrochlorothiazide 25 mg 1 tablet daily -Medications previously tried: none -Current home readings: 119/65, 123/68, 129/71, 121/63, 123/66, 126/67 (checking once a week) -Current dietary habits: puts some salt in foods but not on the table (only to season) -Current exercise habits: active with the [facility] and goes every Monday, Wednesday, and Friday -Denies  hypotensive/hypertensive symptoms -Educated on Exercise goal of 150 minutes per week; Importance of home blood pressure monitoring; Proper BP monitoring technique; -Counseled to monitor BP at home weekly, document, and provide log at future appointments -Counseled on diet and exercise extensively Recommended to continue current medication  Hyperlipidemia: (LDL goal < 100) -Controlled -Current treatment: Rosuvastatin 40 mg 1 tablet daily -Medications previously tried: simvastatin (ineffective) -Current dietary patterns: did not discuss -Current exercise habits: active with the [facility] and goes every Monday, Wednesday, and Friday -Educated on Cholesterol goals;  Importance of limiting foods high in cholesterol; Exercise goal of 150 minutes per week; -Counseled on diet and exercise extensively Recommended to continue current medication Recommended repeat lipid panel  GERD (Goal: minimize symptoms) -Controlled -Current treatment  Omeprazole 20 mg 1 capsule daily Famotidine 20 mg 1 tablet at bedtime -Medications previously tried: none  -Counseled on Non-pharmacologic management of symptoms such as elevating the head of your bed, avoiding eating 2-3 hours before bed, avoiding triggering foods such as acidic, spicy, or fatty foods, eating smaller meals, and wearing clothes that are loose around the waist  Health Maintenance -Vaccine gaps: none -Current therapy:  Aspirin 81 mg 1 tablet daily Vitamin B complex w/vitamin C 1 tablet daily Calcium carbonate 600 mg 1 tablet twice daily Vitamin D3 5000 units 1 tablet daily Coenzyme Q10 30 mg 1 capsule daily Cranberry daily Glucosamine 1 tablet twice daily Hydrocortisone 2.5% ointment apply twice daily Magnesium oxide 400 mg 1 tablet daily -Educated on Cost vs benefit of each product must be carefully weighed by individual consumer -Patient is satisfied with current therapy and denies issues -Recommended to continue current  medication  Patient Goals/Self-Care Activities Patient will:  - take medications as prescribed check blood pressure weekly, document, and provide at future appointments  target a minimum of 150 minutes of moderate intensity exercise weekly  Follow Up Plan: Telephone follow up appointment with care management team member scheduled for: 1 year       Patient verbalizes understanding of instructions provided today and agrees to view in [place].  Telephone follow up appointment with pharmacy team member scheduled for: 1 year  Viona Gilmore, [facility]

## 2021-07-20 ENCOUNTER — Other Ambulatory Visit: Payer: Self-pay | Admitting: Adult Health

## 2021-07-25 DIAGNOSIS — I10 Essential (primary) hypertension: Secondary | ICD-10-CM

## 2021-07-25 DIAGNOSIS — E782 Mixed hyperlipidemia: Secondary | ICD-10-CM | POA: Diagnosis not present

## 2021-08-06 DIAGNOSIS — H01119 Allergic dermatitis of unspecified eye, unspecified eyelid: Secondary | ICD-10-CM | POA: Diagnosis not present

## 2021-08-06 DIAGNOSIS — L219 Seborrheic dermatitis, unspecified: Secondary | ICD-10-CM | POA: Diagnosis not present

## 2021-08-06 DIAGNOSIS — Z23 Encounter for immunization: Secondary | ICD-10-CM | POA: Diagnosis not present

## 2021-08-17 IMAGING — MG DIGITAL SCREENING BILAT W/ TOMO W/ CAD
8 series · 8 of 24 positions shown · non-contrast
Comparison: Prior films

CLINICAL DATA: Screening.

EXAM:
DIGITAL SCREENING BILATERAL MAMMOGRAM WITH TOMO AND CAD

[R MLO synth-2D]
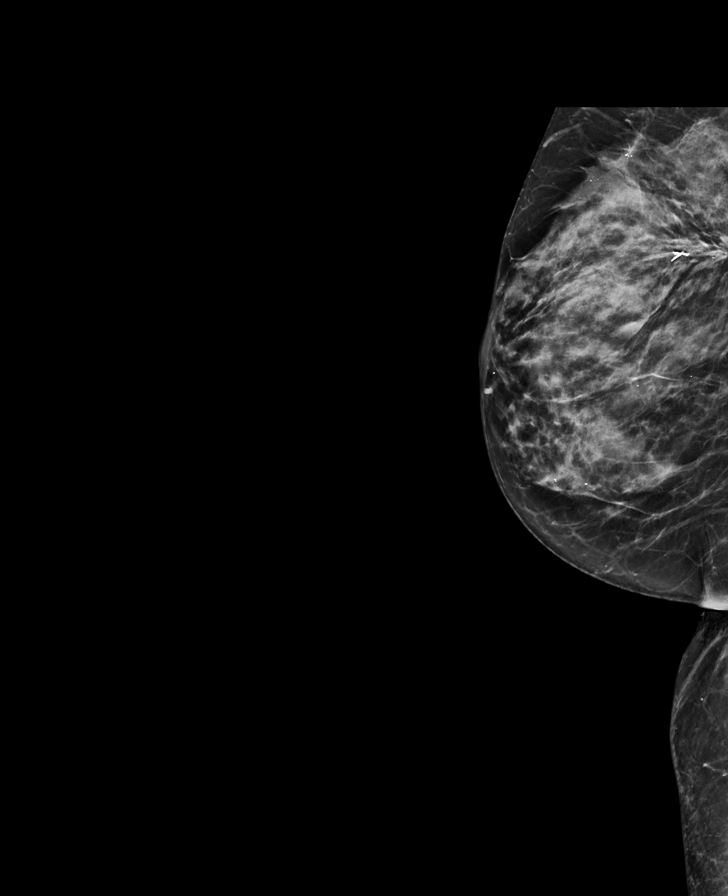

[L MLO synth-2D]
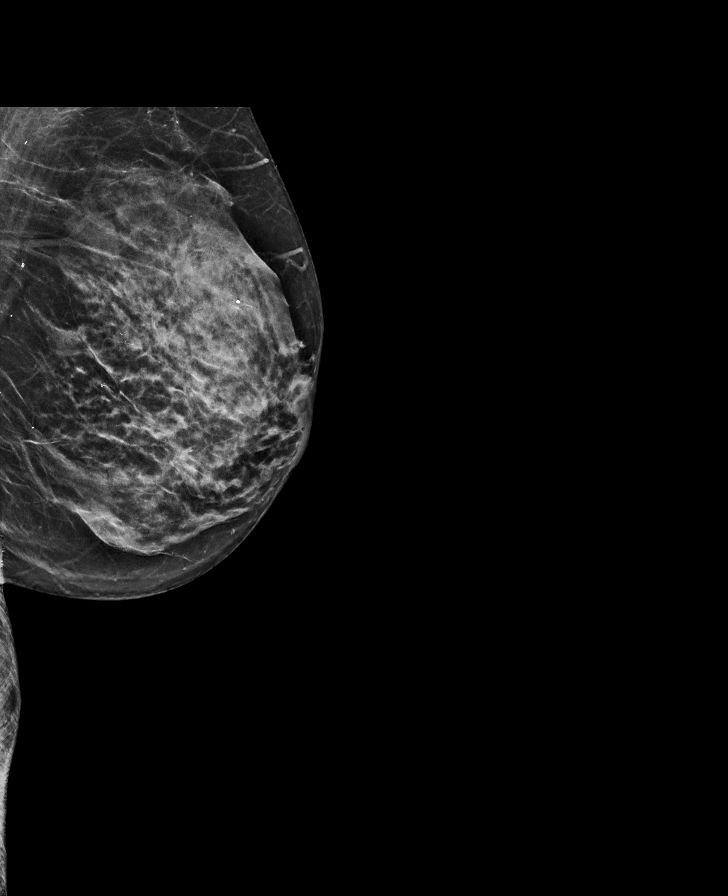

[L CC synth-2D]
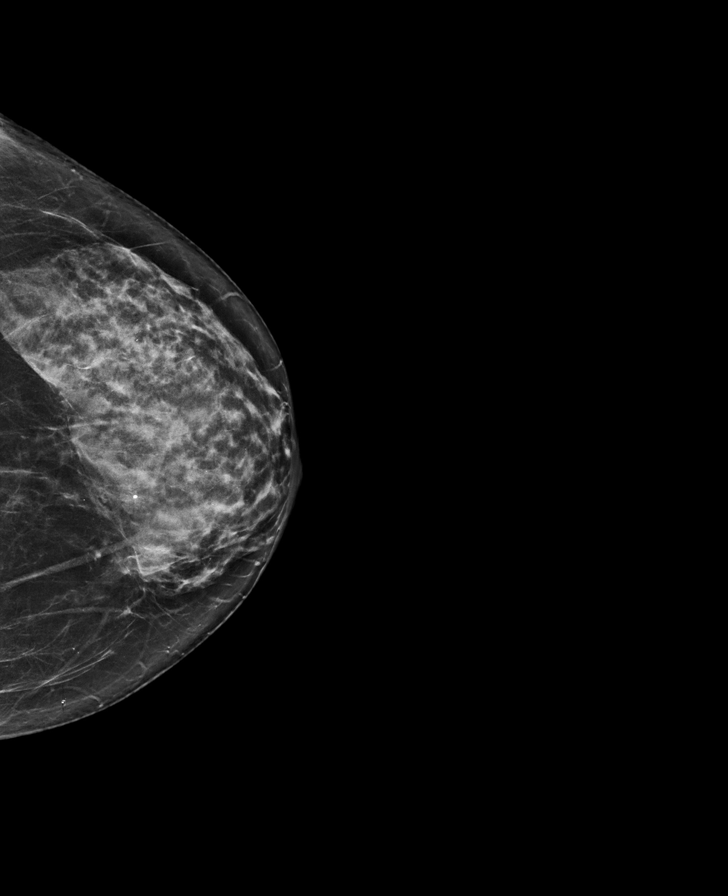

[R CC synth-2D]
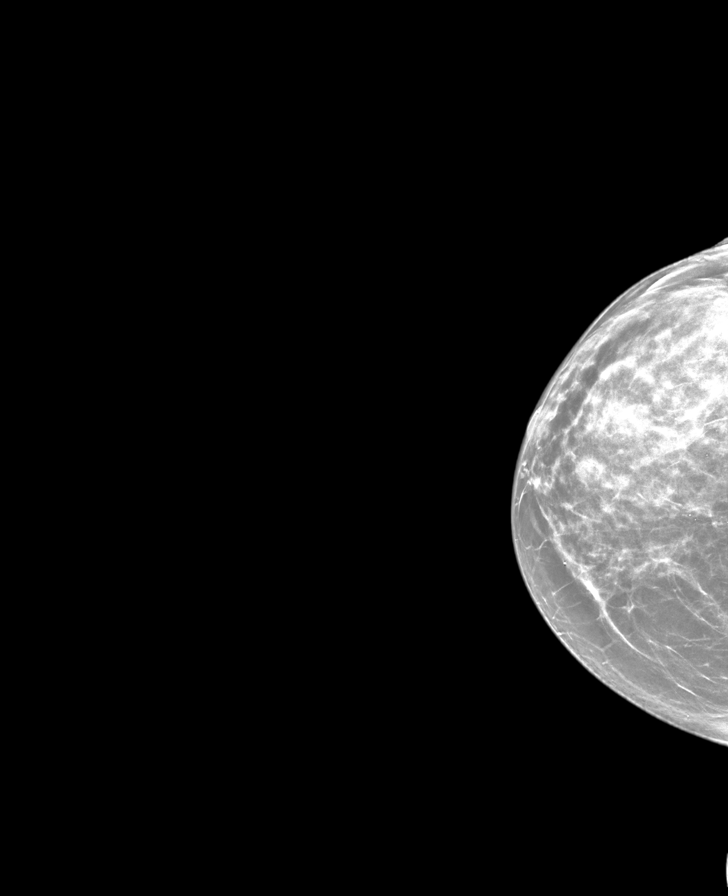

[L MLO tomo · tomo slice 35/69.0]
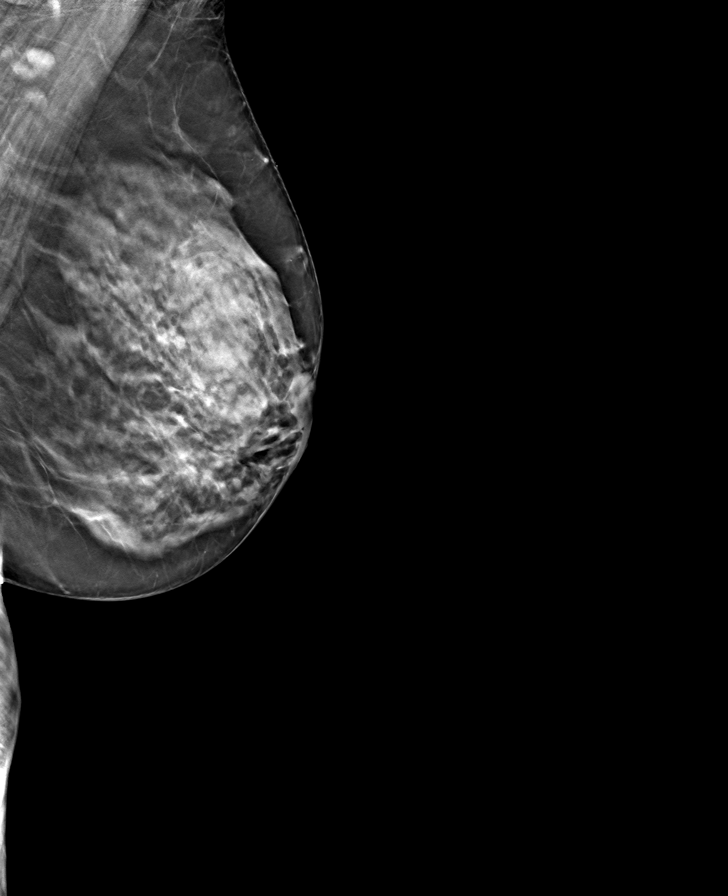

[L CC tomo · tomo slice 35/70.0]
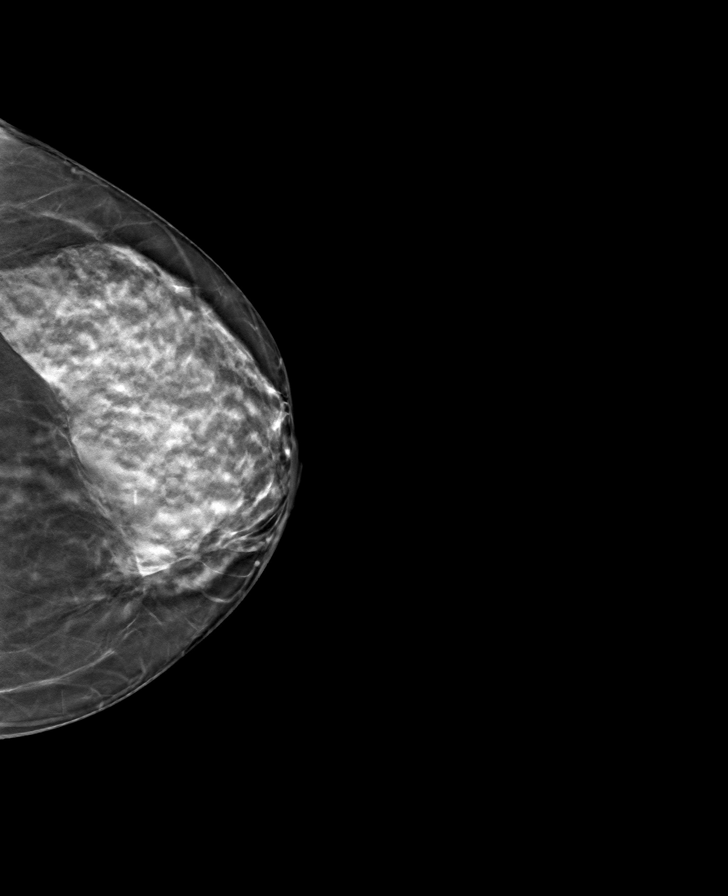

[R CC tomo · tomo slice 33/65.0]
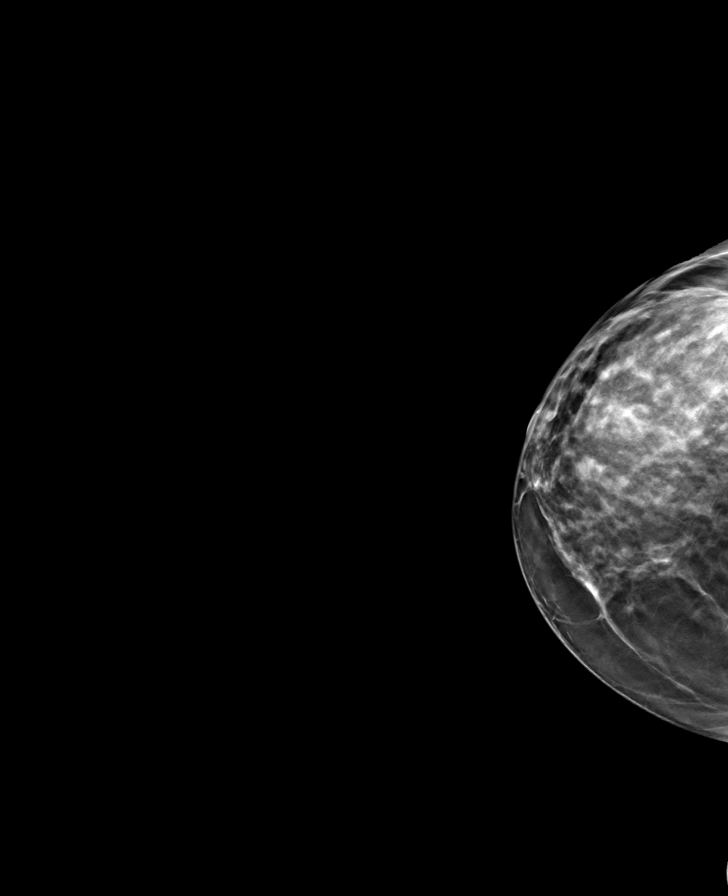

[R MLO tomo · tomo slice 32/63.0]
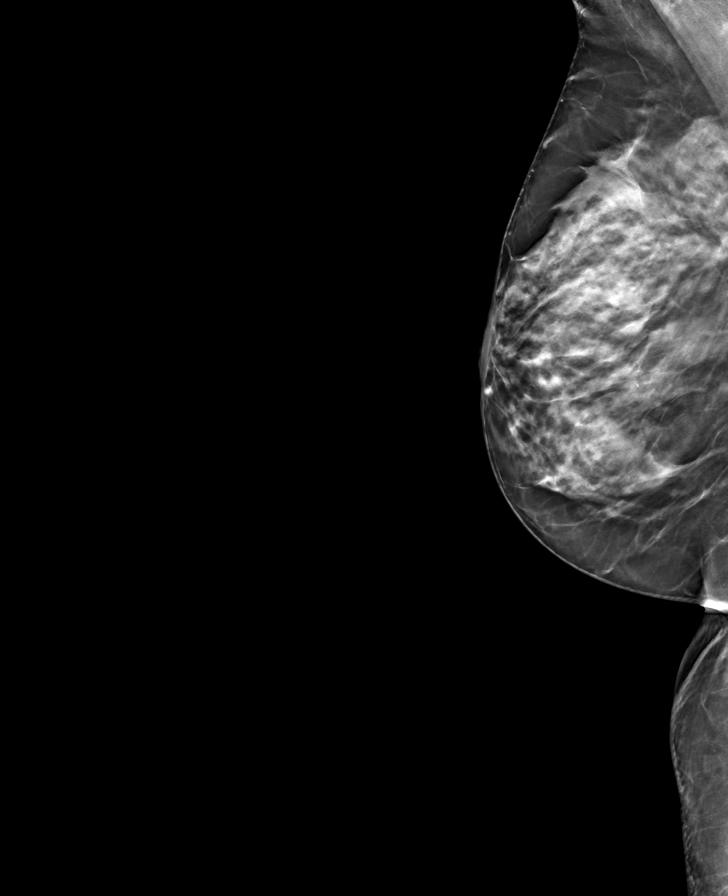

[8 of 24 positions shown; findings below may reference images not displayed]

ACR Breast Density Category d: The breast tissue is extremely dense,
which lowers the sensitivity of mammography.
FINDINGS: In the right breast, a possible asymmetry warrants further
evaluation. In the left breast, no findings suspicious for
malignancy. Images were processed with CAD.
IMPRESSION: Further evaluation is suggested for possible asymmetry in the right
breast.

RECOMMENDATION:
Diagnostic mammogram and possibly ultrasound of the right breast.
(Code:[id number])

The patient will be contacted regarding the findings, and additional
imaging will be scheduled.

BI-RADS CATEGORY  0: Incomplete. Need additional imaging evaluation
and/or prior mammograms for comparison.

## 2021-09-02 DIAGNOSIS — L239 Allergic contact dermatitis, unspecified cause: Secondary | ICD-10-CM | POA: Diagnosis not present

## 2021-09-07 DIAGNOSIS — L259 Unspecified contact dermatitis, unspecified cause: Secondary | ICD-10-CM | POA: Diagnosis not present

## 2021-09-07 DIAGNOSIS — Z23 Encounter for immunization: Secondary | ICD-10-CM | POA: Diagnosis not present

## 2021-09-08 DIAGNOSIS — H52203 Unspecified astigmatism, bilateral: Secondary | ICD-10-CM | POA: Diagnosis not present

## 2021-09-08 DIAGNOSIS — H524 Presbyopia: Secondary | ICD-10-CM | POA: Diagnosis not present

## 2021-09-08 DIAGNOSIS — H5213 Myopia, bilateral: Secondary | ICD-10-CM | POA: Diagnosis not present

## 2021-09-08 DIAGNOSIS — Z961 Presence of intraocular lens: Secondary | ICD-10-CM | POA: Diagnosis not present

## 2021-09-24 ENCOUNTER — Ambulatory Visit (INDEPENDENT_AMBULATORY_CARE_PROVIDER_SITE_OTHER): Payer: PPO | Admitting: Adult Health

## 2021-09-24 ENCOUNTER — Encounter: Payer: Self-pay | Admitting: Adult Health

## 2021-09-24 VITALS — BP 118/62 | HR 79 | Temp 98.6°F | Ht 61.0 in | Wt 140.0 lb

## 2021-09-24 DIAGNOSIS — R110 Nausea: Secondary | ICD-10-CM

## 2021-09-24 DIAGNOSIS — R42 Dizziness and giddiness: Secondary | ICD-10-CM | POA: Diagnosis not present

## 2021-09-24 MED ORDER — HYDROCHLOROTHIAZIDE 12.5 MG PO CAPS: 12.5000 mg | ORAL_CAPSULE | Freq: Every day | ORAL | 0 refills | Status: DC

## 2021-09-24 NOTE — Progress Notes (Signed)
Subjective:    Patient ID: Patricia Solis, female    DOB: [birth year], 71 y.o.   MRN: [id number]  HPI 71 year old female who  has a past medical history of Basal cell carcinoma (2007), Breast cancer ([place]), Carotid bruit (08/2008), DUB (dysfunctional uterine bleeding), Esophagitis, HTN (hypertension), Hyperlipidemia, Hypothyroidism, MHA (microangiopathic hemolytic anemia) ([place]), Personal history of chemotherapy, Personal history of radiation therapy, and Spinal stenosis.  She presents to the office today for an acute issue of nausea and lightheadedness.  These 2 issues do not coincide.  Reports that over the last 2 weeks she has had intermittent lightheadedness.  She does not feel dizzy or as though the world is spinning.  Has noticed that the lightheadedness happens when she bends over and comes back up.  This sensation lasts only a few minutes.  Has not had any lightheadedness when changing positions such as from a seated to a standing.  No lightheadedness with walking.  She has been monitoring her blood pressures at home with readings in the low 100s to 140s over 60s to 80s with most of her blood pressures being 120 or below.  She has been working out more and eating healthier, has been able to lose approximately 8 pounds over the last few months.  He is taking HCTZ 25 mg daily for blood pressure control  Additionally over the last 4 to 5 days she has been experiencing intermittent nausea, more so when she wakes up in the morning.  This is sometimes accompanied by a bowel movement.  The bowel movement is normal in consistency and color.  No abdominal pain, cramping, or vomiting.   Review of Systems See HPI   Past Medical History:  Diagnosis Date   Basal cell carcinoma 2007   BCC   Breast cancer ([place])    Invasive lobular carcinoma RIGHT breast   Carotid bruit 08/2008   Normal Doppler   DUB (dysfunctional uterine bleeding)    Esophagitis    HTN (hypertension)    Tx started 06/2010    Hyperlipidemia    Hypothyroidism    MHA (microangiopathic hemolytic anemia) ([facility])    Personal history of chemotherapy    2003   Personal history of radiation therapy    2003 right breast   Spinal stenosis     Social History   Socioeconomic History   Marital status: Married    Spouse name: Not on file   Number of children: Not on file   Years of education: Not on file   Highest education level: Not on file  Occupational History   Not on file  Tobacco Use   Smoking status: Never   Smokeless tobacco: Never  Substance and Sexual Activity   Alcohol use: No   Drug use: No   Sexual activity: Not on file  Other Topics Concern   Not on file  Social History Narrative   Retired in March    Married for 38 years    One daughter, lives in [place]    Has four grandchildren      Likes to play with grand kids, go to [employer] and mountains.       Social Determinants of Health   Financial Resource Strain: Low Risk    Difficulty of Paying Living Expenses: Not hard at all  Food Insecurity: No Food Insecurity   Worried About Charity fundraiser in the Last Year: Never true   Ran Out of Food in the Last Year: Never true  Transportation Needs: No Data processing manager (Medical): No   Lack of Transportation (Non-Medical): No  Physical Activity: Sufficiently Active   Days of Exercise per Week: 3 days   Minutes of Exercise per Session: 120 min  Stress: No Stress Concern Present   Feeling of Stress : Not at all  Social Connections: Moderately Integrated   Frequency of Communication with Friends and Family: Twice a week   Frequency of Social Gatherings with Friends and Family: More than three times a week   Attends Religious Services: 1 to 4 times per year   Active Member of [employer] or Organizations: No   Attends Archivist Meetings: Never   Marital Status: Married  Human resources officer Violence: Not At Risk   Fear of Current or Ex-Partner: No   Emotionally  Abused: No   Physically Abused: No   Sexually Abused: No    Past Surgical History:  Procedure Laterality Date   BREAST BIOPSY Right    2007 benign   BREAST LUMPECTOMY Right    2003   BTL     CATARACT EXTRACTION, BILATERAL Bilateral 03/26/2016   still blurred vision; Md states this is fine   CERVICAL LAMINECTOMY     Left   FOOT SURGERY     SHOULDER ARTHROSCOPY Right    TUBAL LIGATION  1981    Family History  Problem Relation Age of Onset   Heart attack Father    Coronary artery disease Father    Diabetes Father    Coronary artery disease Mother    Leukemia Mother    Thyroid disease Mother    Multiple sclerosis Sister        Twin   Breast cancer Maternal Aunt     Allergies  Allergen Reactions   Ambien Cr [Zolpidem] Other (See Comments)    Hallucinations   Cozaar [Losartan] Cough   Erythromycin     REACTION: rash   Hydrocodone Other (See Comments)    Hallucinations   Lisinopril Cough   Methyldibromoglutaronitrile Dermatitis   Penicillins     REACTION: rash   Bromine Rash    Per dermatologist    Clindamycin/Lincomycin Rash    Current Outpatient Medications on File Prior to Visit  Medication Sig Dispense Refill   aspirin 81 MG EC tablet Take 81 mg by mouth daily.     B Complex-C (B-COMPLEX WITH VITAMIN C) tablet Take 1 tablet by mouth daily.     calcium carbonate (OS-CAL) 600 MG TABS tablet Take 1 tablet (600 mg total) by mouth 2 (two) times daily with a meal. 180 tablet 3   Cholecalciferol (VITAMIN D3 PO) Take by mouth.     Coenzyme Q10 200 MG TABS Take 200 mg by mouth daily.     CRANBERRY PO Take 2 tablets by mouth at bedtime.     famotidine (PEPCID) 40 MG tablet Take 1 tablet (40 mg total) by mouth at bedtime. 90 tablet 3   GLUCOSAMINE PO Take 1 tablet by mouth 2 (two) times daily.     hydrochlorothiazide (HYDRODIURIL) 25 MG tablet Take 1 tablet (25 mg total) by mouth daily. 90 tablet 3   hydrocortisone 2.5 % ointment Apply topically 2 (two) times daily.      levothyroxine (EUTHYROX) 75 MCG tablet Take 1 tablet (75 mcg total) by mouth daily before breakfast. 90 tablet 3   magnesium oxide (MAG-OX) 400 MG tablet Take 400 mg by mouth daily.     omeprazole (PRILOSEC) 20 MG capsule  Take 1 capsule (20 mg total) by mouth daily. 90 capsule 3   rosuvastatin (CRESTOR) 40 MG tablet Take 1 tablet by mouth once daily 90 tablet 3   No current facility-administered medications on file prior to visit.    BP 118/62    Pulse 79    Temp 98.6 F (37 C) (Oral)    Ht 5\' 1"  (1.549 m)    Wt 140 lb (63.5 kg)    SpO2 97%    BMI 26.45 kg/m        Objective:   Physical Exam Vitals and nursing note reviewed.  Constitutional:      Appearance: Normal appearance.  HENT:     Nose: Nose normal. No congestion or rhinorrhea.  Eyes:     Extraocular Movements: Extraocular movements intact.     Right eye: No nystagmus.     Left eye: No nystagmus.     Pupils: Pupils are equal, round, and reactive to light.  Neck:     Vascular: No carotid bruit.  Cardiovascular:     Rate and Rhythm: Normal rate and regular rhythm.     Pulses: Normal pulses.     Heart sounds: Normal heart sounds.  Pulmonary:     Effort: Pulmonary effort is normal.     Breath sounds: Normal breath sounds.  Skin:    General: Skin is warm and dry.  Neurological:     General: No focal deficit present.     Mental Status: She is alert and oriented to person, place, and time.  Psychiatric:        Mood and Affect: Mood normal.        Behavior: Behavior normal.        Thought Content: Thought content normal.        Judgment: Judgment normal.      Assessment & Plan:  1. Lightheadedness -Could not recreate lightheadedness in the office today.  No nystagmus or vertigo-like symptoms.  He was not orthostatic.  We will decrease her HCTZ from 25 mg to 12.5 mg to see if this helps resolve her lightheadedness.  She will monitor her blood pressure at home as well as symptoms of lightheaded send me a MyChart  message.  2. Nausea -No abdominal pain with palpation.  She does continue with her GERD medication.  Advise may be adding fiber supplement or healthy snack before bedtime to see if this aids in reducing the amount of nausea she has in the morning.  She will follow-up if symptoms continue  Dorothyann Peng, NP

## 2021-10-23 ENCOUNTER — Ambulatory Visit (INDEPENDENT_AMBULATORY_CARE_PROVIDER_SITE_OTHER): Payer: PPO | Admitting: Adult Health

## 2021-10-23 VITALS — BP 138/64 | HR 91 | Temp 98.8°F | Ht 61.0 in | Wt 140.0 lb

## 2021-10-23 DIAGNOSIS — L409 Psoriasis, unspecified: Secondary | ICD-10-CM

## 2021-10-23 MED ORDER — TRETINOIN 0.01 % EX GEL: Freq: Every day | CUTANEOUS | 0 refills | Status: DC

## 2021-10-23 NOTE — Progress Notes (Signed)
? ?Subjective:  ? ? Patient ID: Patricia Solis, female    DOB: October 02, 1950, 71 y.o.   MRN: [id number] ? ?HPI ?71 year old female who  has a past medical history of Basal cell carcinoma (2007), Breast cancer ([place]), Carotid bruit (08/2008), DUB (dysfunctional uterine bleeding), Esophagitis, HTN (hypertension), Hyperlipidemia, Hypothyroidism, MHA (microangiopathic hemolytic anemia) ([place]), Personal history of chemotherapy, Personal history of radiation therapy, and Spinal stenosis. ? ?She presents to the office today for a chronic issue.  She reports that over the last year to she has experienced intermittent rash in the hairline across her forehead down towards her ears, under her chin, and on her neck.  This rash raise and run off.  Can be itchy at times.  She has been seen by dermatology and prescribed antifungals and hydrocortisone cream which she finds that the hydrocortisone cream works to some degree but does not resolve the rash completely. ? ? ?Review of Systems ?See HPI  ? ?Past Medical History:  ?Diagnosis Date  ? Basal cell carcinoma 2007  ? BCC  ? Breast cancer ([place])   ? Invasive lobular carcinoma RIGHT breast  ? Carotid bruit 08/2008  ? Normal Doppler  ? DUB (dysfunctional uterine bleeding)   ? Esophagitis   ? HTN (hypertension)   ? Tx started 06/2010  ? Hyperlipidemia   ? Hypothyroidism   ? MHA (microangiopathic hemolytic anemia) ([facility])   ? Personal history of chemotherapy   ? 2003  ? Personal history of radiation therapy   ? 2003 right breast  ? Spinal stenosis   ? ? ?Social History  ? ?Socioeconomic History  ? Marital status: Married  ?  Spouse name: Not on file  ? Number of children: Not on file  ? Years of education: Not on file  ? Highest education level: Not on file  ?Occupational History  ? Not on file  ?Tobacco Use  ? Smoking status: Never  ? Smokeless tobacco: Never  ?Substance and Sexual Activity  ? Alcohol use: No  ? Drug use: No  ? Sexual activity: Not on file  ?Other Topics Concern  ? Not on  file  ?Social History Narrative  ? Retired in March   ? Married for 38 years   ? One daughter, lives in [place]   ? Has four grandchildren  ?   ? Likes to play with grand kids, go to [employer] and mountains.   ?   ? ?Social Determinants of Health  ? ?Financial Resource Strain: Low Risk   ? Difficulty of Paying Living Expenses: Not hard at all  ?Food Insecurity: No Food Insecurity  ? Worried About Charity fundraiser in the Last Year: Never true  ? Ran Out of Food in the Last Year: Never true  ?Transportation Needs: No Transportation Needs  ? Lack of Transportation (Medical): No  ? Lack of Transportation (Non-Medical): No  ?Physical Activity: Sufficiently Active  ? Days of Exercise per Week: 3 days  ? Minutes of Exercise per Session: 120 min  ?Stress: No Stress Concern Present  ? Feeling of Stress : Not at all  ?Social Connections: Moderately Integrated  ? Frequency of Communication with Friends and Family: Twice a week  ? Frequency of Social Gatherings with Friends and Family: More than three times a week  ? Attends Religious Services: 1 to 4 times per year  ? Active Member of Clubs or Organizations: No  ? Attends Archivist Meetings: Never  ? Marital Status: Married  ?  Intimate Partner Violence: Not At Risk  ? Fear of Current or Ex-Partner: No  ? Emotionally Abused: No  ? Physically Abused: No  ? Sexually Abused: No  ? ? ?Past Surgical History:  ?Procedure Laterality Date  ? BREAST BIOPSY Right   ? 2007 benign  ? BREAST LUMPECTOMY Right   ? 2003  ? BTL    ? CATARACT EXTRACTION, BILATERAL Bilateral 03/26/2016  ? still blurred vision; Md states this is fine  ? CERVICAL LAMINECTOMY    ? Left  ? FOOT SURGERY    ? SHOULDER ARTHROSCOPY Right   ? TUBAL LIGATION  1981  ? ? ?Family History  ?Problem Relation Age of Onset  ? Heart attack Father   ? Coronary artery disease Father   ? Diabetes Father   ? Coronary artery disease Mother   ? Leukemia Mother   ? Thyroid disease Mother   ? Multiple sclerosis Sister   ?     Twin   ? Breast cancer Maternal Aunt   ? ? ?Allergies  ?Allergen Reactions  ? Ambien Cr [Zolpidem] Other (See Comments)  ?  Hallucinations  ? Cozaar [Losartan] Cough  ? Erythromycin   ?  REACTION: rash  ? Hydrocodone Other (See Comments)  ?  Hallucinations  ? Lisinopril Cough  ? Methyldibromoglutaronitrile Dermatitis  ? Penicillins   ?  REACTION: rash  ? Bromine Rash  ?  Per dermatologist   ? Clindamycin/Lincomycin Rash  ? ? ?Current Outpatient Medications on File Prior to Visit  ?Medication Sig Dispense Refill  ? aspirin 81 MG EC tablet Take 81 mg by mouth daily.    ? B Complex-C (B-COMPLEX WITH VITAMIN C) tablet Take 1 tablet by mouth daily.    ? calcium carbonate (OS-CAL) 600 MG TABS tablet Take 1 tablet (600 mg total) by mouth 2 (two) times daily with a meal. 180 tablet 3  ? Cholecalciferol (VITAMIN D3 PO) Take by mouth.    ? Coenzyme Q10 200 MG TABS Take 200 mg by mouth daily.    ? CRANBERRY PO Take 2 tablets by mouth at bedtime.    ? famotidine (PEPCID) 40 MG tablet Take 1 tablet (40 mg total) by mouth at bedtime. 90 tablet 3  ? GLUCOSAMINE PO Take 1 tablet by mouth 2 (two) times daily.    ? hydrochlorothiazide (HYDRODIURIL) 25 MG tablet Take 1 tablet (25 mg total) by mouth daily. 90 tablet 3  ? hydrochlorothiazide (MICROZIDE) 12.5 MG capsule Take 1 capsule (12.5 mg total) by mouth daily. 90 capsule 0  ? hydrocortisone 2.5 % ointment Apply topically 2 (two) times daily.    ? levothyroxine (EUTHYROX) 75 MCG tablet Take 1 tablet (75 mcg total) by mouth daily before breakfast. 90 tablet 3  ? magnesium oxide (MAG-OX) 400 MG tablet Take 400 mg by mouth daily.    ? omeprazole (PRILOSEC) 20 MG capsule Take 1 capsule (20 mg total) by mouth daily. 90 capsule 3  ? rosuvastatin (CRESTOR) 40 MG tablet Take 1 tablet by mouth once daily 90 tablet 3  ? ?No current facility-administered medications on file prior to visit.  ? ? ?BP 138/64   Pulse 91   Temp 98.8 ?F (37.1 ?C) (Oral)   Ht '5\' 1"'$  (1.549 m)   Wt 140 lb (63.5 kg)    SpO2 95%   BMI 26.45 kg/m?  ? ? ?   ?Objective:  ? Physical Exam ?Vitals and nursing note reviewed.  ?Constitutional:   ?   Appearance: Normal  appearance.  ?Skin: ?   General: Skin is warm and dry.  ?   Capillary Refill: Capillary refill takes less than 2 seconds.  ?   Findings: Rash present.  ?   Comments: She has a patch of red rough/scaly skin under her chin on the right neck.  She does have the same patch throughout the hairline on her forehead and behind the ears.  ?Neurological:  ?   General: No focal deficit present.  ?   Mental Status: She is alert and oriented to person, place, and time.  ?Psychiatric:     ?   Mood and Affect: Mood normal.     ?   Behavior: Behavior normal.     ?   Thought Content: Thought content normal.     ?   Judgment: Judgment normal.  ? ?   ?Assessment & Plan:  ?1. Psoriasis ?-We will have her try Retin-A.  Can also use Neutrogena T-Gel shampoo in her hair to help with the rash at her hairline.  Advise follow-up if no improvement ?- tretinoin (RETIN-A) 0.01 % gel; Apply topically at bedtime.  Dispense: 45 g; Refill: 0 ? ?Dorothyann Peng, NP ? ?

## 2021-10-23 NOTE — Patient Instructions (Signed)
It looks like you have psoriasis  ? ?I have send in Retin A - if this is more than $15-20 dollars then you can buy Differin Gel  ?

## 2021-12-07 ENCOUNTER — Other Ambulatory Visit: Payer: Self-pay | Admitting: Adult Health

## 2021-12-08 ENCOUNTER — Ambulatory Visit (INDEPENDENT_AMBULATORY_CARE_PROVIDER_SITE_OTHER): Payer: PPO | Admitting: Adult Health

## 2021-12-08 ENCOUNTER — Encounter: Payer: Self-pay | Admitting: Adult Health

## 2021-12-08 VITALS — BP 130/80 | HR 78 | Temp 98.3°F | Wt 138.0 lb

## 2021-12-08 DIAGNOSIS — G8929 Other chronic pain: Secondary | ICD-10-CM | POA: Diagnosis not present

## 2021-12-08 DIAGNOSIS — M25561 Pain in right knee: Secondary | ICD-10-CM | POA: Diagnosis not present

## 2021-12-08 MED ORDER — METHYLPREDNISOLONE ACETATE 80 MG/ML IJ SUSP
80.0000 mg | Freq: Once | INTRAMUSCULAR | Status: AC
  Administered 2021-12-08: 80 mg via INTRA_ARTICULAR

## 2021-12-08 NOTE — Progress Notes (Signed)
? ?Subjective:  ? ? Patient ID: Patricia Solis, female    DOB: [birth year], 71 y.o.   MRN: [id number] ? ?HPI ?71 year old female who  has a past medical history of Basal cell carcinoma (2007), Breast cancer ([place]), Carotid bruit (08/2008), DUB (dysfunctional uterine bleeding), Esophagitis, HTN (hypertension), Hyperlipidemia, Hypothyroidism, MHA (microangiopathic hemolytic anemia) ([place]), Personal history of chemotherapy, Personal history of radiation therapy, and Spinal stenosis. ? ?She presents to the office today for chronic right-sided knee pain.  He has had this knee pain for "years".  Has gone through physical therapy without improvement.  Pain is worse with walking and going upstairs.  She is used various over-the-counter modalities without improvement. ? ?He has had steroid injections in the past and various joints but never in her right knee.  She reports that she was responded well to the steroid injections and is wondering if she can have one today. ? ? ?Review of Systems ?See HPI  ? ?Past Medical History:  ?Diagnosis Date  ? Basal cell carcinoma 2007  ? BCC  ? Breast cancer ([place])   ? Invasive lobular carcinoma RIGHT breast  ? Carotid bruit 08/2008  ? Normal Doppler  ? DUB (dysfunctional uterine bleeding)   ? Esophagitis   ? HTN (hypertension)   ? Tx started 06/2010  ? Hyperlipidemia   ? Hypothyroidism   ? MHA (microangiopathic hemolytic anemia) ([facility])   ? Personal history of chemotherapy   ? 2003  ? Personal history of radiation therapy   ? 2003 right breast  ? Spinal stenosis   ? ? ?Social History  ? ?Socioeconomic History  ? Marital status: Married  ?  Spouse name: Not on file  ? Number of children: Not on file  ? Years of education: Not on file  ? Highest education level: Not on file  ?Occupational History  ? Not on file  ?Tobacco Use  ? Smoking status: Never  ? Smokeless tobacco: Never  ?Substance and Sexual Activity  ? Alcohol use: No  ? Drug use: No  ? Sexual activity: Not on file  ?Other Topics  Concern  ? Not on file  ?Social History Narrative  ? Retired in March   ? Married for 38 years   ? One daughter, lives in [place]   ? Has four grandchildren  ?   ? Likes to play with grand kids, go to [employer] and mountains.   ?   ? ?Social Determinants of Health  ? ?Financial Resource Strain: Low Risk   ? Difficulty of Paying Living Expenses: Not hard at all  ?Food Insecurity: No Food Insecurity  ? Worried About Charity fundraiser in the Last Year: Never true  ? Ran Out of Food in the Last Year: Never true  ?Transportation Needs: No Transportation Needs  ? Lack of Transportation (Medical): No  ? Lack of Transportation (Non-Medical): No  ?Physical Activity: Sufficiently Active  ? Days of Exercise per Week: 3 days  ? Minutes of Exercise per Session: 120 min  ?Stress: No Stress Concern Present  ? Feeling of Stress : Not at all  ?Social Connections: Moderately Integrated  ? Frequency of Communication with Friends and Family: Twice a week  ? Frequency of Social Gatherings with Friends and Family: More than three times a week  ? Attends Religious Services: 1 to 4 times per year  ? Active Member of Clubs or Organizations: No  ? Attends Archivist Meetings: Never  ? Marital Status: Married  ?  Intimate Partner Violence: Not At Risk  ? Fear of Current or Ex-Partner: No  ? Emotionally Abused: No  ? Physically Abused: No  ? Sexually Abused: No  ? ? ?Past Surgical History:  ?Procedure Laterality Date  ? BREAST BIOPSY Right   ? 2007 benign  ? BREAST LUMPECTOMY Right   ? 2003  ? BTL    ? CATARACT EXTRACTION, BILATERAL Bilateral 03/26/2016  ? still blurred vision; Md states this is fine  ? CERVICAL LAMINECTOMY    ? Left  ? FOOT SURGERY    ? SHOULDER ARTHROSCOPY Right   ? TUBAL LIGATION  1981  ? ? ?Family History  ?Problem Relation Age of Onset  ? Heart attack Father   ? Coronary artery disease Father   ? Diabetes Father   ? Coronary artery disease Mother   ? Leukemia Mother   ? Thyroid disease Mother   ? Multiple sclerosis  Sister   ?     Twin  ? Breast cancer Maternal Aunt   ? ? ?Allergies  ?Allergen Reactions  ? Ambien Cr [Zolpidem] Other (See Comments)  ?  Hallucinations  ? Cozaar [Losartan] Cough  ? Erythromycin   ?  REACTION: rash  ? Hydrocodone Other (See Comments)  ?  Hallucinations  ? Lisinopril Cough  ? Methyldibromoglutaronitrile Dermatitis  ? Penicillins   ?  REACTION: rash  ? Bromine Rash  ?  Per dermatologist   ? Clindamycin/Lincomycin Rash  ? ? ?Current Outpatient Medications on File Prior to Visit  ?Medication Sig Dispense Refill  ? aspirin 81 MG EC tablet Take 81 mg by mouth daily.    ? B Complex-C (B-COMPLEX WITH VITAMIN C) tablet Take 1 tablet by mouth daily.    ? calcium carbonate (OS-CAL) 600 MG TABS tablet Take 1 tablet (600 mg total) by mouth 2 (two) times daily with a meal. 180 tablet 3  ? Cholecalciferol (VITAMIN D3 PO) Take by mouth.    ? Coenzyme Q10 200 MG TABS Take 200 mg by mouth daily.    ? CRANBERRY PO Take 2 tablets by mouth at bedtime.    ? famotidine (PEPCID) 40 MG tablet Take 1 tablet (40 mg total) by mouth at bedtime. 90 tablet 3  ? GLUCOSAMINE PO Take 1 tablet by mouth 2 (two) times daily.    ? hydrochlorothiazide (HYDRODIURIL) 25 MG tablet Take 1 tablet (25 mg total) by mouth daily. 90 tablet 3  ? hydrochlorothiazide (MICROZIDE) 12.5 MG capsule Take 1 capsule by mouth once daily 90 capsule 0  ? hydrocortisone 2.5 % ointment Apply topically 2 (two) times daily.    ? levothyroxine (EUTHYROX) 75 MCG tablet Take 1 tablet (75 mcg total) by mouth daily before breakfast. 90 tablet 3  ? magnesium oxide (MAG-OX) 400 MG tablet Take 400 mg by mouth daily.    ? omeprazole (PRILOSEC) 20 MG capsule Take 1 capsule (20 mg total) by mouth daily. 90 capsule 3  ? rosuvastatin (CRESTOR) 40 MG tablet Take 1 tablet by mouth once daily 90 tablet 3  ? tretinoin (RETIN-A) 0.01 % gel Apply topically at bedtime. 45 g 0  ? ?No current facility-administered medications on file prior to visit.  ? ? ?BP 130/80   Pulse 78    Temp 98.3 ?F (36.8 ?C)   Wt 138 lb (62.6 kg)   SpO2 95%   BMI 26.07 kg/m?  ? ? ?   ?Objective:  ? Physical Exam ?Vitals and nursing note reviewed.  ?Constitutional:   ?  Appearance: Normal appearance.  ?Musculoskeletal:  ?   Right knee: Bony tenderness and crepitus present. Normal range of motion.  ?Skin: ?   General: Skin is warm and dry.  ?   Capillary Refill: Capillary refill takes less than 2 seconds.  ?Neurological:  ?   General: No focal deficit present.  ?   Mental Status: She is alert and oriented to person, place, and time.  ?   Gait: Gait abnormal (limping gait. Favoring right side).  ?Psychiatric:     ?   Mood and Affect: Mood normal.     ?   Behavior: Behavior normal.     ?   Thought Content: Thought content normal.     ?   Judgment: Judgment normal.  ? ?   ?Assessment & Plan:  ?1. Chronic pain of right knee ?Discussed risks and benefits of corticosteroid injection and patient consented.  After prepping skin with betadine, injected 80 mg depomedrol and 2 cc of plain xylocaine with 22 gauge one and one half inch needle using anterolateral approach and pt tolerated well. ? ?- methylPREDNISolone acetate (DEPO-MEDROL) injection 80 mg ?- Follow up as needed ? ?Dorothyann Peng, NP ? ? ?

## 2022-01-16 ENCOUNTER — Other Ambulatory Visit: Payer: Self-pay | Admitting: Adult Health

## 2022-01-18 ENCOUNTER — Other Ambulatory Visit: Payer: Self-pay | Admitting: Obstetrics and Gynecology

## 2022-01-18 DIAGNOSIS — Z1231 Encounter for screening mammogram for malignant neoplasm of breast: Secondary | ICD-10-CM

## 2022-01-19 ENCOUNTER — Ambulatory Visit (INDEPENDENT_AMBULATORY_CARE_PROVIDER_SITE_OTHER): Payer: PPO | Admitting: Family Medicine

## 2022-01-19 ENCOUNTER — Ambulatory Visit: Payer: PPO

## 2022-01-19 ENCOUNTER — Encounter: Payer: Self-pay | Admitting: Family Medicine

## 2022-01-19 ENCOUNTER — Ambulatory Visit (INDEPENDENT_AMBULATORY_CARE_PROVIDER_SITE_OTHER): Payer: PPO

## 2022-01-19 VITALS — BP 140/66 | HR 75 | Temp 98.5°F | Ht 61.0 in | Wt 138.6 lb

## 2022-01-19 DIAGNOSIS — M25572 Pain in left ankle and joints of left foot: Secondary | ICD-10-CM | POA: Diagnosis not present

## 2022-01-19 DIAGNOSIS — M19072 Primary osteoarthritis, left ankle and foot: Secondary | ICD-10-CM | POA: Diagnosis not present

## 2022-01-19 DIAGNOSIS — M25571 Pain in right ankle and joints of right foot: Secondary | ICD-10-CM | POA: Diagnosis not present

## 2022-01-19 DIAGNOSIS — M7989 Other specified soft tissue disorders: Secondary | ICD-10-CM | POA: Diagnosis not present

## 2022-02-16 ENCOUNTER — Encounter: Payer: Self-pay | Admitting: Orthopaedic Surgery

## 2022-02-16 ENCOUNTER — Ambulatory Visit: Payer: PPO | Admitting: Orthopaedic Surgery

## 2022-02-16 DIAGNOSIS — G5601 Carpal tunnel syndrome, right upper limb: Secondary | ICD-10-CM | POA: Diagnosis not present
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
## 2022-02-24 ENCOUNTER — Ambulatory Visit
Admission: RE | Admit: 2022-02-24 | Discharge: 2022-02-24 | Disposition: A | Discharge status: Home / Self Care | Payer: PPO | Source: Ambulatory Visit | Attending: Obstetrics and Gynecology | Admitting: Obstetrics and Gynecology

## 2022-02-24 DIAGNOSIS — Z1231 Encounter for screening mammogram for malignant neoplasm of breast: Secondary | ICD-10-CM

## 2022-03-08 ENCOUNTER — Telehealth: Payer: Self-pay | Admitting: Orthopaedic Surgery

## 2022-03-08 NOTE — Telephone Encounter (Signed)
I left voicemail requesting return call. 

## 2022-03-08 NOTE — Telephone Encounter (Signed)
Patient called, says her right thumb is swollen. Is it normal? Her call back number is [phone]

## 2022-03-09 DIAGNOSIS — L814 Other melanin hyperpigmentation: Secondary | ICD-10-CM | POA: Diagnosis not present

## 2022-03-09 DIAGNOSIS — L821 Other seborrheic keratosis: Secondary | ICD-10-CM | POA: Diagnosis not present

## 2022-03-09 DIAGNOSIS — D1801 Hemangioma of skin and subcutaneous tissue: Secondary | ICD-10-CM | POA: Diagnosis not present

## 2022-03-09 DIAGNOSIS — L578 Other skin changes due to chronic exposure to nonionizing radiation: Secondary | ICD-10-CM | POA: Diagnosis not present

## 2022-03-09 DIAGNOSIS — L219 Seborrheic dermatitis, unspecified: Secondary | ICD-10-CM | POA: Diagnosis not present

## 2022-03-09 DIAGNOSIS — D2371 Other benign neoplasm of skin of right lower limb, including hip: Secondary | ICD-10-CM | POA: Diagnosis not present

## 2022-03-09 DIAGNOSIS — Z85828 Personal history of other malignant neoplasm of skin: Secondary | ICD-10-CM | POA: Diagnosis not present

## 2022-03-09 NOTE — Telephone Encounter (Signed)
I called, no answer, left voicemail for return call.

## 2022-03-10 NOTE — Telephone Encounter (Signed)
I called, no answer. Voicemail left requesting return call if she still wishes to speak with someone. Voicemail x 3 left, closing encounter.

## 2022-03-14 ENCOUNTER — Other Ambulatory Visit: Payer: Self-pay | Admitting: Adult Health

## 2022-03-15 DIAGNOSIS — E039 Hypothyroidism, unspecified: Secondary | ICD-10-CM | POA: Diagnosis not present

## 2022-03-15 DIAGNOSIS — N958 Other specified menopausal and perimenopausal disorders: Secondary | ICD-10-CM | POA: Diagnosis not present

## 2022-03-15 DIAGNOSIS — Z6826 Body mass index (BMI) 26.0-26.9, adult: Secondary | ICD-10-CM | POA: Diagnosis not present

## 2022-03-15 DIAGNOSIS — M8588 Other specified disorders of bone density and structure, other site: Secondary | ICD-10-CM | POA: Diagnosis not present

## 2022-03-15 DIAGNOSIS — Z8262 Family history of osteoporosis: Secondary | ICD-10-CM | POA: Diagnosis not present

## 2022-03-15 DIAGNOSIS — Z124 Encounter for screening for malignant neoplasm of cervix: Secondary | ICD-10-CM | POA: Diagnosis not present

## 2022-03-16 ENCOUNTER — Ambulatory Visit: Payer: PPO | Admitting: Orthopaedic Surgery

## 2022-03-16 ENCOUNTER — Encounter: Payer: Self-pay | Admitting: Orthopaedic Surgery

## 2022-03-16 VITALS — BP 121/69 | HR 82 | Ht 61.0 in | Wt 138.0 lb

## 2022-03-16 DIAGNOSIS — M65331 Trigger finger, right middle finger: Secondary | ICD-10-CM | POA: Diagnosis not present

## 2022-03-16 DIAGNOSIS — M65341 Trigger finger, right ring finger: Secondary | ICD-10-CM | POA: Diagnosis not present

## 2022-03-16 DIAGNOSIS — M76821 Posterior tibial tendinitis, right leg: Secondary | ICD-10-CM | POA: Diagnosis not present

## 2022-03-16 DIAGNOSIS — G5601 Carpal tunnel syndrome, right upper limb: Secondary | ICD-10-CM

## 2022-03-17 DIAGNOSIS — M65331 Trigger finger, right middle finger: Secondary | ICD-10-CM | POA: Insufficient documentation

## 2022-03-17 DIAGNOSIS — M65341 Trigger finger, right ring finger: Secondary | ICD-10-CM | POA: Insufficient documentation

## 2022-03-17 DIAGNOSIS — M76821 Posterior tibial tendinitis, right leg: Secondary | ICD-10-CM | POA: Insufficient documentation

## 2022-03-17 NOTE — Progress Notes (Deleted)
Office Visit Note   Patient: Patricia Solis           Date of Birth: 09/23/1950           MRN: [id number] Visit Date: 03/16/2022              Requested by: Dorothyann Peng, NP [address],  [place] [id number] PCP: Dorothyann Peng, NP   Assessment & Plan: Visit Diagnoses:  1. Carpal tunnel syndrome, right upper limb   2. Posterior tibial tendinitis, right leg     Plan: We will place her in a cam boot to rest her foot she can use and wear is much as possible for several weeks.  Follow-up if she has ongoing symptoms.  Follow-Up Instructions: No follow-ups on file.   Orders:  Orders Placed This Encounter  Procedures   Ambulatory referral to Physical Medicine Rehab   No orders of the defined types were placed in this encounter.     Procedures: No procedures performed   Clinical Data: No additional findings.   Subjective: Chief Complaint  Patient presents with   Right Hand - Follow-up    HPI patient returns for follow-up.  She has been using the splint at night for carpal tunnel complaints.  She had negative findings for left carpal tunnel few years ago but no recent electrical test.  She states the splint has helped some.  She had more problems with pain in her right heel x3 weeks with no known injury.  Does not hurt with when she walks or applies pressure.  She has some pain with certain motion.  Some tenderness over the dorsum of her foot some over the plantar surface.  Previous ankle x-rays in June did not demonstrate any calcaneal spurs and no tarsal coalition.  Patient has more tenderness immediately adjacent to the heel and pain if she tries to walk on her toes.  Review of Systems all other systems noncontributory.   Objective: Vital Signs: BP 121/69   Pulse 82   Ht '5\' 1"'$  (1.549 m)   Wt 138 lb (62.6 kg)   BMI 26.07 kg/m   Physical Exam Constitutional:      Appearance: She is well-developed.  HENT:     Head: Normocephalic.     Right  Ear: External ear normal.     Left Ear: External ear normal. There is no impacted cerumen.  Eyes:     Pupils: Pupils are equal, round, and reactive to light.  Neck:     Thyroid: No thyromegaly.     Trachea: No tracheal deviation.  Cardiovascular:     Rate and Rhythm: Normal rate.  Pulmonary:     Effort: Pulmonary effort is normal.  Abdominal:     Palpations: Abdomen is soft.  Musculoskeletal:     Cervical back: No rigidity.  Skin:    General: Skin is warm and dry.  Neurological:     Mental Status: She is alert and oriented to person, place, and time.  Psychiatric:        Behavior: Behavior normal.     Ortho Exam patient has tenderness of the posterior tibial tendon more on the right than left pain with resisted posterior tibial function.  Negative Coleman block test.  Discomfort with attempted toe walking.  No tenderness over the navicular.  Anterior ankle joint is normal negative anterior drawer no normal subtalar motion.  Specialty Comments:  No specialty comments available.  Imaging: No results found.   PMFS History:  Patient Active Problem List   Diagnosis Date Noted   Posterior tibial tendinitis, right leg 03/17/2022   Carpal tunnel syndrome, right upper limb 02/16/2022   Chondromalacia patellae, left knee 06/09/2021   Other spondylosis with radiculopathy, cervical region 05/11/2021   History of fusion of cervical spine 05/11/2021   Pes anserine bursitis 05/11/2021   GERD (gastroesophageal reflux disease) 09/29/2020   Esophageal stricture 01/26/2011   Arrhythmia 12/29/2010   Essential hypertension 07/21/2010   CONTACT DERMATITIS&OTHER ECZEMA DUE UNSPEC CAUSE 04/07/2010   Hyperlipidemia 09/11/2007   Migraine headache 09/11/2007   Hypothyroidism 01/26/2007   Past Medical History:  Diagnosis Date   Basal cell carcinoma 2007   BCC   Breast cancer ([place])    Invasive lobular carcinoma RIGHT breast   Carotid bruit 08/2008   Normal Doppler   DUB (dysfunctional  uterine bleeding)    Esophagitis    HTN (hypertension)    Tx started 06/2010   Hyperlipidemia    Hypothyroidism    MHA (microangiopathic hemolytic anemia) (HCC)    Personal history of chemotherapy    2003   Personal history of radiation therapy    2003 right breast   Spinal stenosis     Family History  Problem Relation Age of Onset   Heart attack Father    Coronary artery disease Father    Diabetes Father    Coronary artery disease Mother    Leukemia Mother    Thyroid disease Mother    Multiple sclerosis Sister        Twin   Breast cancer Maternal Aunt     Past Surgical History:  Procedure Laterality Date   BREAST BIOPSY Right    2007 benign   BREAST LUMPECTOMY Right    2003   BTL     CATARACT EXTRACTION, BILATERAL Bilateral 03/26/2016   still blurred vision; Md states this is fine   CERVICAL LAMINECTOMY     Left   FOOT SURGERY     SHOULDER ARTHROSCOPY Right    TUBAL LIGATION  1981   Social History   Occupational History   Not on file  Tobacco Use   Smoking status: Never   Smokeless tobacco: Never  Substance and Sexual Activity   Alcohol use: No   Drug use: No   Sexual activity: Not on file

## 2022-03-17 NOTE — Progress Notes (Signed)
Office Visit Note   Patient: Patricia Solis           Date of Birth: 10/13/1950           MRN: [id number] Visit Date: 03/16/2022              Requested by: Dorothyann Peng, NP [address],  [place] [id number] PCP: Dorothyann Peng, NP   Assessment & Plan: Visit Diagnoses: Bilateral carpal tunnel syndrome right worse than left. Trigger finger right ring and right middle finger  Plan: We will proceed with nerve conduction velocities evaluate her for right carpal tunnel syndrome.  She has solid fusion at C4-5 and C5-6.  We can follow-up and address the trigger finger at the same time if surgical intervention for the carpal tunnel syndrome is necessary.  Follow-Up Instructions: No follow-ups on file.   Orders:  Orders Placed This Encounter  Procedures   Ambulatory referral to Physical Medicine Rehab   No orders of the defined types were placed in this encounter.     Procedures: No procedures performed   Clinical Data: No additional findings.   Subjective: Chief Complaint  Patient presents with   Right Hand - Follow-up    HPI 71 year old female with previous prior two-level fusion C5-6 C6-7 which is solid.  She has spondylosis at C4-5 above the fusion and lesser degree at C7-T1 below the fusion.  Patient's had problems with triggering of the long and ring finger of her right hand.  Also problems with numbness and tingling in her right hand that wakes her up at night she has to shake her hand for she is able to go back to sleep some symptoms on the left but not as severe as the right.  She has used Advil occasionally with relief.  Long finger and ring finger on the right hand wake her up in a locked position.  Review of Systems past history of arrhythmia previous cervical fusion hyperlipidemia hypothyroidism GERD hypertension all the systems noncontributory to HPI.   Objective: Vital Signs: BP 121/69   Pulse 82   Ht '5\' 1"'$  (1.549 m)   Wt 138 lb (62.6 kg)    BMI 26.07 kg/m   Physical Exam Constitutional:      Appearance: She is well-developed.  HENT:     Head: Normocephalic.     Right Ear: External ear normal.     Left Ear: External ear normal. There is no impacted cerumen.  Eyes:     Pupils: Pupils are equal, round, and reactive to light.  Neck:     Thyroid: No thyromegaly.     Trachea: No tracheal deviation.  Cardiovascular:     Rate and Rhythm: Normal rate.  Pulmonary:     Effort: Pulmonary effort is normal.  Abdominal:     Palpations: Abdomen is soft.  Musculoskeletal:     Cervical back: No rigidity.  Skin:    General: Skin is warm and dry.  Neurological:     Mental Status: She is alert and oriented to person, place, and time.  Psychiatric:        Behavior: Behavior normal.     Ortho Exam patient has minimal brachial plexus tenderness well-healed anterior incision.  Thenar atrophy on the right no atrophy on the left.  Thenar weakness on the right positive carpal compression test positive Phalen's test on the right negative compression test positive Phalen's test on the left.  Tenderness at the A1 pulley long and ring fingers she  is able to demonstrate active painful triggering.  Specialty Comments:  No specialty comments available.  Imaging: No results found.   PMFS History: Patient Active Problem List   Diagnosis Date Noted   Trigger finger, right ring finger 03/17/2022   Trigger finger, right middle finger 03/17/2022   Carpal tunnel syndrome, right upper limb 02/16/2022   Chondromalacia patellae, left knee 06/09/2021   Other spondylosis with radiculopathy, cervical region 05/11/2021   History of fusion of cervical spine 05/11/2021   Pes anserine bursitis 05/11/2021   GERD (gastroesophageal reflux disease) 09/29/2020   Esophageal stricture 01/26/2011   Arrhythmia 12/29/2010   Essential hypertension 07/21/2010   CONTACT DERMATITIS&OTHER ECZEMA DUE UNSPEC CAUSE 04/07/2010   Hyperlipidemia 09/11/2007    Migraine headache 09/11/2007   Hypothyroidism 01/26/2007   Past Medical History:  Diagnosis Date   Basal cell carcinoma 2007   BCC   Breast cancer ([facility])    Invasive lobular carcinoma RIGHT breast   Carotid bruit 08/2008   Normal Doppler   DUB (dysfunctional uterine bleeding)    Esophagitis    HTN (hypertension)    Tx started 06/2010   Hyperlipidemia    Hypothyroidism    MHA (microangiopathic hemolytic anemia) (HCC)    Personal history of chemotherapy    2003   Personal history of radiation therapy    2003 right breast   Spinal stenosis     Family History  Problem Relation Age of Onset   Heart attack Father    Coronary artery disease Father    Diabetes Father    Coronary artery disease Mother    Leukemia Mother    Thyroid disease Mother    Multiple sclerosis Sister        Twin   Breast cancer Maternal Aunt     Past Surgical History:  Procedure Laterality Date   BREAST BIOPSY Right    2007 benign   BREAST LUMPECTOMY Right    2003   BTL     CATARACT EXTRACTION, BILATERAL Bilateral 03/26/2016   still blurred vision; Md states this is fine   CERVICAL LAMINECTOMY     Left   FOOT SURGERY     SHOULDER ARTHROSCOPY Right    TUBAL LIGATION  1981   Social History   Occupational History   Not on file  Tobacco Use   Smoking status: Never   Smokeless tobacco: Never  Substance and Sexual Activity   Alcohol use: No   Drug use: No   Sexual activity: Not on file

## 2022-03-23 ENCOUNTER — Encounter: Payer: Self-pay | Admitting: Orthopaedic Surgery

## 2022-03-23 NOTE — Telephone Encounter (Signed)
Please see message from patient below. It looks like you have made an attempt to reach her.  Could you please reach out again to schedule?

## 2022-04-09 ENCOUNTER — Ambulatory Visit: Payer: PPO | Admitting: Physical Medicine and Rehabilitation

## 2022-04-09 ENCOUNTER — Encounter: Payer: Self-pay | Admitting: Physical Medicine and Rehabilitation

## 2022-04-09 DIAGNOSIS — R202 Paresthesia of skin: Secondary | ICD-10-CM

## 2022-04-11 NOTE — Procedures (Signed)
EMG & NCV Findings: Evaluation of the left median (across palm) sensory nerve showed prolonged distal peak latency (Wrist, 3.9 ms).  All remaining nerves (as indicated in the following tables) were within normal limits.  Left vs. Right side comparison data for the ulnar motor nerve indicates abnormal L-R velocity difference (A Elbow-B Elbow, 48 m/s).  All remaining left vs. right side differences were within normal limits.    All examined muscles (as indicated in the following table) showed no evidence of electrical instability.    Impression: The above electrodiagnostic study is ABNORMAL and reveals evidence of a mild left median nerve entrapment at the wrist (carpal tunnel syndrome) affecting sensory components.  There was no significant electrodiagnostic evidence of cervical radiculopathy or other focal nerve compression.  Pain in the right hand seems to be more muscular tenderness and not likely carpal tunnel or radiculopathy.  Recommendations: 1.  Follow-up with referring physician. 2.  Continue current management of symptoms.  ___________________________ Laurence Spates FAAPMR Board Certified, American Board of Physical Medicine and Rehabilitation    Nerve Conduction Studies Anti Sensory Summary Table   Stim Site NR Peak (ms) Norm Peak (ms) P-T Amp (V) Norm P-T Amp Site1 Site2 Delta-P (ms) Dist (cm) Vel (m/s) Norm Vel (m/s)  Left Median Acr Palm Anti Sensory (2nd Digit)  31.6C  Wrist    *3.9 <3.6 25.5 >10 Wrist Palm 1.9 0.0    Palm    2.0 <2.0 32.8         Right Median Acr Palm Anti Sensory (2nd Digit)  31.6C  Wrist    3.5 <3.6 26.5 >10 Wrist Palm 1.5 0.0    Palm    2.0 <2.0 19.3         Left Radial Anti Sensory (Base 1st Digit)  31C  Wrist    2.3 <3.1 30.7  Wrist Base 1st Digit 2.3 0.0    Right Radial Anti Sensory (Base 1st Digit)  31.2C  Wrist    2.3 <3.1 27.3  Wrist Base 1st Digit 2.3 0.0    Left Ulnar Anti Sensory (5th Digit)  31.5C  Wrist    3.3 <3.7 27.9 >15.0 Wrist  5th Digit 3.3 14.0 42 >38  Right Ulnar Anti Sensory (5th Digit)  32C  Wrist    3.2 <3.7 23.4 >15.0 Wrist 5th Digit 3.2 14.0 44 >38   Motor Summary Table   Stim Site NR Onset (ms) Norm Onset (ms) O-P Amp (mV) Norm O-P Amp Site1 Site2 Delta-0 (ms) Dist (cm) Vel (m/s) Norm Vel (m/s)  Left Median Motor (Abd Poll Brev)  31.2C  Wrist    3.7 <4.2 6.0 >5 Elbow Wrist 3.4 21.0 62 >50  Elbow    7.1  3.6         Right Median Motor (Abd Poll Brev)  31.5C  Wrist    3.4 <4.2 5.7 >5 Elbow Wrist 3.6 19.0 53 >50  Elbow    7.0  1.9         Left Ulnar Motor (Abd Dig Min)  31.2C  Wrist    3.1 <4.2 6.6 >3 B Elbow Wrist 3.0 18.0 60 >53  B Elbow    6.1  6.8  A Elbow B Elbow 1.3 10.0 77 >53  A Elbow    7.4  5.8         Right Ulnar Motor (Abd Dig Min)  31.7C  Wrist    3.0 <4.2 8.2 >3 B Elbow Wrist 2.9 18.5 64 >53  B Elbow  5.9  7.9  A Elbow B Elbow 0.8 10.0 125 >53  A Elbow    6.7  7.7          EMG   Side Muscle Nerve Root Ins Act Fibs Psw Amp Dur Poly Recrt Int Fraser Din Comment  Left Abd Poll Brev Median C8-T1 Nml Nml Nml Nml Nml 0 Nml Nml   Left 1stDorInt Ulnar C8-T1 Nml Nml Nml Nml Nml 0 Nml Nml   Left PronatorTeres Median C6-7 Nml Nml Nml Nml Nml 0 Nml Nml   Left Biceps Musculocut C5-6 Nml Nml Nml Nml Nml 0 Nml Nml   Left Deltoid Axillary C5-6 Nml Nml Nml Nml Nml 0 Nml Nml     Nerve Conduction Studies Anti Sensory Left/Right Comparison   Stim Site L Lat (ms) R Lat (ms) L-R Lat (ms) L Amp (V) R Amp (V) L-R Amp (%) Site1 Site2 L Vel (m/s) R Vel (m/s) L-R Vel (m/s)  Median Acr Palm Anti Sensory (2nd Digit)  31.6C  Wrist *3.9 3.5 0.4 25.5 26.5 3.8 Wrist Palm     Palm 2.0 2.0 0.0 32.8 19.3 41.2       Radial Anti Sensory (Base 1st Digit)  31C  Wrist 2.3 2.3 0.0 30.7 27.3 11.1 Wrist Base 1st Digit     Ulnar Anti Sensory (5th Digit)  31.5C  Wrist 3.3 3.2 0.1 27.9 23.4 16.1 Wrist 5th Digit 42 44 2   Motor Left/Right Comparison   Stim Site L Lat (ms) R Lat (ms) L-R Lat (ms) L Amp (mV) R Amp (mV)  L-R Amp (%) Site1 Site2 L Vel (m/s) R Vel (m/s) L-R Vel (m/s)  Median Motor (Abd Poll Brev)  31.2C  Wrist 3.7 3.4 0.3 6.0 5.7 5.0 Elbow Wrist 62 53 9  Elbow 7.1 7.0 0.1 3.6 1.9 47.2       Ulnar Motor (Abd Dig Min)  31.2C  Wrist 3.1 3.0 0.1 6.6 8.2 19.5 B Elbow Wrist 60 64 4  B Elbow 6.1 5.9 0.2 6.8 7.9 13.9 A Elbow B Elbow 77 125 *48  A Elbow 7.4 6.7 0.7 5.8 7.7 24.7          Waveforms:

## 2022-04-11 NOTE — Progress Notes (Signed)
Regenia Erck - 71 y.o. female MRN [id number]  Date of birth: 10-Jan-1951  Office Visit Note: Visit Date: 04/09/2022 PCP: Dorothyann Peng, NP Referred by: Marybelle Killings, MD  Subjective: Chief Complaint  Patient presents with   Right Hand - Numbness, Pain   Left Hand - Numbness, Pain   HPI:  Lowell Mcgurk is a 71 y.o. female who comes in today at the request of Dr. Rodell Perna for electrodiagnostic study of the Bilateral upper extremities.  Patient is Right hand dominant.  She saw Dr. Lorin Mercy recently and confirms with me that she has been having severe pain in the right hand with trigger finger of the middle and ring fingers.  She reports waking up at night with them locked but sometimes does shake them.  She does get some tingling type numbness but her main complaint is her right hand pain particularly with activities.  She gets similar complaint of tingling on the left hand without as much pain.  She denies any frank radicular symptoms.  She does have a history of two-level fusion.  I do not see any recent MRI imaging of the cervical spine.  She has not had electrodiagnostic studies.   ROS Otherwise per HPI.  Assessment & Plan: Visit Diagnoses:    ICD-10-CM   1. Paresthesia of skin  R20.2 NCV with EMG (electromyography)      Plan: Impression: The above electrodiagnostic study is ABNORMAL and reveals evidence of a mild left median nerve entrapment at the wrist (carpal tunnel syndrome) affecting sensory components.  There was no significant electrodiagnostic evidence of cervical radiculopathy or other focal nerve compression.  Pain in the right hand seems to be more muscular tenderness and not likely carpal tunnel or radiculopathy.  Recommendations: 1.  Follow-up with referring physician. 2.  Continue current management of symptoms.  Meds & Orders: No orders of the defined types were placed in this encounter.   Orders Placed This Encounter  Procedures   NCV with EMG  (electromyography)    Follow-up: Return in about 2 weeks (around 04/23/2022) for Rodell Perna, MD.   Procedures: No procedures performed  EMG & NCV Findings: Evaluation of the left median (across palm) sensory nerve showed prolonged distal peak latency (Wrist, 3.9 ms).  All remaining nerves (as indicated in the following tables) were within normal limits.  Left vs. Right side comparison data for the ulnar motor nerve indicates abnormal L-R velocity difference (A Elbow-B Elbow, 48 m/s).  All remaining left vs. right side differences were within normal limits.    All examined muscles (as indicated in the following table) showed no evidence of electrical instability.    Impression: The above electrodiagnostic study is ABNORMAL and reveals evidence of a mild left median nerve entrapment at the wrist (carpal tunnel syndrome) affecting sensory components.   Recommendations: 1.  Follow-up with referring physician. 2.  Continue current management of symptoms.  ___________________________ Laurence Spates FAAPMR Board Certified, American Board of Physical Medicine and Rehabilitation    Nerve Conduction Studies Anti Sensory Summary Table   Stim Site NR Peak (ms) Norm Peak (ms) P-T Amp (V) Norm P-T Amp Site1 Site2 Delta-P (ms) Dist (cm) Vel (m/s) Norm Vel (m/s)  Left Median Acr Palm Anti Sensory (2nd Digit)  31.6C  Wrist    *3.9 <3.6 25.5 >10 Wrist Palm 1.9 0.0    Palm    2.0 <2.0 32.8         Right Median Acr Palm Anti Sensory (2nd  Digit)  31.6C  Wrist    3.5 <3.6 26.5 >10 Wrist Palm 1.5 0.0    Palm    2.0 <2.0 19.3         Left Radial Anti Sensory (Base 1st Digit)  31C  Wrist    2.3 <3.1 30.7  Wrist Base 1st Digit 2.3 0.0    Right Radial Anti Sensory (Base 1st Digit)  31.2C  Wrist    2.3 <3.1 27.3  Wrist Base 1st Digit 2.3 0.0    Left Ulnar Anti Sensory (5th Digit)  31.5C  Wrist    3.3 <3.7 27.9 >15.0 Wrist 5th Digit 3.3 14.0 42 >38  Right Ulnar Anti Sensory (5th Digit)  32C  Wrist     3.2 <3.7 23.4 >15.0 Wrist 5th Digit 3.2 14.0 44 >38   Motor Summary Table   Stim Site NR Onset (ms) Norm Onset (ms) O-P Amp (mV) Norm O-P Amp Site1 Site2 Delta-0 (ms) Dist (cm) Vel (m/s) Norm Vel (m/s)  Left Median Motor (Abd Poll Brev)  31.2C  Wrist    3.7 <4.2 6.0 >5 Elbow Wrist 3.4 21.0 62 >50  Elbow    7.1  3.6         Right Median Motor (Abd Poll Brev)  31.5C  Wrist    3.4 <4.2 5.7 >5 Elbow Wrist 3.6 19.0 53 >50  Elbow    7.0  1.9         Left Ulnar Motor (Abd Dig Min)  31.2C  Wrist    3.1 <4.2 6.6 >3 B Elbow Wrist 3.0 18.0 60 >53  B Elbow    6.1  6.8  A Elbow B Elbow 1.3 10.0 77 >53  A Elbow    7.4  5.8         Right Ulnar Motor (Abd Dig Min)  31.7C  Wrist    3.0 <4.2 8.2 >3 B Elbow Wrist 2.9 18.5 64 >53  B Elbow    5.9  7.9  A Elbow B Elbow 0.8 10.0 125 >53  A Elbow    6.7  7.7          EMG   Side Muscle Nerve Root Ins Act Fibs Psw Amp Dur Poly Recrt Int Fraser Din Comment  Left Abd Poll Brev Median C8-T1 Nml Nml Nml Nml Nml 0 Nml Nml   Left 1stDorInt Ulnar C8-T1 Nml Nml Nml Nml Nml 0 Nml Nml   Left PronatorTeres Median C6-7 Nml Nml Nml Nml Nml 0 Nml Nml   Left Biceps Musculocut C5-6 Nml Nml Nml Nml Nml 0 Nml Nml   Left Deltoid Axillary C5-6 Nml Nml Nml Nml Nml 0 Nml Nml     Nerve Conduction Studies Anti Sensory Left/Right Comparison   Stim Site L Lat (ms) R Lat (ms) L-R Lat (ms) L Amp (V) R Amp (V) L-R Amp (%) Site1 Site2 L Vel (m/s) R Vel (m/s) L-R Vel (m/s)  Median Acr Palm Anti Sensory (2nd Digit)  31.6C  Wrist *3.9 3.5 0.4 25.5 26.5 3.8 Wrist Palm     Palm 2.0 2.0 0.0 32.8 19.3 41.2       Radial Anti Sensory (Base 1st Digit)  31C  Wrist 2.3 2.3 0.0 30.7 27.3 11.1 Wrist Base 1st Digit     Ulnar Anti Sensory (5th Digit)  31.5C  Wrist 3.3 3.2 0.1 27.9 23.4 16.1 Wrist 5th Digit 42 44 2   Motor Left/Right Comparison   Stim Site L Lat (ms) R Lat (ms) L-R Lat (  ms) L Amp (mV) R Amp (mV) L-R Amp (%) Site1 Site2 L Vel (m/s) R Vel (m/s) L-R Vel (m/s)  Median Motor (Abd  Poll Brev)  31.2C  Wrist 3.7 3.4 0.3 6.0 5.7 5.0 Elbow Wrist 62 53 9  Elbow 7.1 7.0 0.1 3.6 1.9 47.2       Ulnar Motor (Abd Dig Min)  31.2C  Wrist 3.1 3.0 0.1 6.6 8.2 19.5 B Elbow Wrist 60 64 4  B Elbow 6.1 5.9 0.2 6.8 7.9 13.9 A Elbow B Elbow 77 125 *48  A Elbow 7.4 6.7 0.7 5.8 7.7 24.7          Waveforms:                      Clinical History: No specialty comments available.     Objective:  VS:  HT:    WT:   BMI:     BP:   HR: bpm  TEMP: ( )  RESP:  Physical Exam Musculoskeletal:        General: No swelling, tenderness or deformity.     Comments: Inspection reveals no atrophy of the bilateral APB or FDI or hand intrinsics. There is no swelling, color changes, allodynia or dystrophic changes. There is 5 out of 5 strength in the bilateral wrist extension, finger abduction and long finger flexion. There is intact sensation to light touch in all dermatomal and peripheral nerve distributions. T There is a negative Phalen's test bilaterally. There is a negative Hoffmann's test bilaterally.  Skin:    General: Skin is warm and dry.     Findings: No erythema or rash.  Neurological:     General: No focal deficit present.     Mental Status: She is alert and oriented to person, place, and time.     Motor: No weakness or abnormal muscle tone.     Coordination: Coordination normal.  Psychiatric:        Mood and Affect: Mood normal.        Behavior: Behavior normal.      Imaging: No results found.

## 2022-04-12 ENCOUNTER — Ambulatory Visit (INDEPENDENT_AMBULATORY_CARE_PROVIDER_SITE_OTHER): Payer: PPO

## 2022-04-12 VITALS — Ht 61.0 in | Wt 138.0 lb

## 2022-04-12 DIAGNOSIS — Z0000 Encounter for general adult medical examination without abnormal findings: Secondary | ICD-10-CM | POA: Diagnosis not present

## 2022-04-12 NOTE — Progress Notes (Signed)
I connected with Patricia Solis today by telephone and verified that I am speaking with the correct person using two identifiers. Location patient: home Location provider: work Persons participating in the virtual visit: Alfonse Alpers LPN.   I discussed the limitations, risks, security and privacy concerns of performing an evaluation and management service by telephone and the availability of in person appointments. I also discussed with the patient that there may be a patient responsible charge related to this service. The patient expressed understanding and verbally consented to this telephonic visit.    Interactive audio and video telecommunications were attempted between this provider and patient, however failed, due to patient having technical difficulties OR patient did not have access to video capability.  We continued and completed visit with audio only.     Vital signs may be patient reported or missing.  Subjective:   Patricia Solis is a 71 y.o. female who presents for Medicare Annual (Subsequent) preventive examination.  Review of Systems     Cardiac Risk Factors include: advanced age (>8mn, >>48women);dyslipidemia;hypertension     Objective:    Today's Vitals   04/12/22 1326  Weight: 138 lb (62.6 kg)  Height: '5\' 1"'$  (1.549 m)  PainSc: 2    Body mass index is 26.07 kg/m.     04/12/2022    1:31 PM 05/14/2021    8:11 AM 04/08/2021    3:24 PM 05/23/2020    9:09 AM 11/13/2019   12:33 PM 07/09/2019    1:51 AM 04/28/2017    9:04 AM  Advanced Directives  Does Patient Have a Medical Advance Directive? Yes Yes Yes Yes Yes No Yes  Type of [place]of [place] will Healthcare Power of [place] will [place] will    Does patient want to make changes to medical advance directive?  No - Patient declined  No - Patient declined  No - Guardian declined    Copy of HNadinein Chart? Yes - validated most recent copy scanned in chart (See row information) No - copy requested No - copy requested No - copy requested       Current Medications (verified) Outpatient Encounter Medications as of 04/12/2022  Medication Sig   aspirin 81 MG EC tablet Take 81 mg by mouth daily.   B Complex-C (B-COMPLEX WITH VITAMIN C) tablet Take 1 tablet by mouth daily.   calcium carbonate (OS-CAL) 600 MG TABS tablet Take 1 tablet (600 mg total) by mouth 2 (two) times daily with a meal.   Cholecalciferol (VITAMIN D3 PO) Take by mouth.   Coenzyme Q10 200 MG TABS Take 200 mg by mouth daily.   CRANBERRY PO Take 2 tablets by mouth at bedtime.   famotidine (PEPCID) 40 MG tablet Take 1 tablet (40 mg total) by mouth at bedtime.   GLUCOSAMINE PO Take 1 tablet by mouth 2 (two) times daily.   hydrochlorothiazide (MICROZIDE) 12.5 MG capsule Take 1 capsule by mouth once daily   hydrocortisone 2.5 % ointment Apply topically 2 (two) times daily.   levothyroxine (EUTHYROX) 75 MCG tablet Take 1 tablet (75 mcg total) by mouth daily before breakfast.   magnesium oxide (MAG-OX) 400 MG tablet Take 400 mg by mouth daily.   omeprazole (PRILOSEC) 20 MG capsule Take 1 capsule by mouth once daily   rosuvastatin (CRESTOR) 40 MG tablet Take 1 tablet by mouth once daily   tretinoin (RETIN-A)  0.01 % gel Apply topically at bedtime. (Patient not taking: Reported on 04/12/2022)   [DISCONTINUED] hydrochlorothiazide (HYDRODIURIL) 25 MG tablet Take 1 tablet (25 mg total) by mouth daily.   No facility-administered encounter medications on file as of 04/12/2022.    Allergies (verified) Ambien cr [zolpidem], Cozaar [losartan], Erythromycin, Hydrocodone, Lisinopril, Methyldibromoglutaronitrile, Penicillins, Bromine, and Clindamycin/lincomycin   History: Past Medical History:  Diagnosis Date   Basal cell carcinoma 2007   BCC   Breast cancer ([place])    Invasive  lobular carcinoma RIGHT breast   Carotid bruit 08/2008   Normal Doppler   DUB (dysfunctional uterine bleeding)    Esophagitis    HTN (hypertension)    Tx started 06/2010   Hyperlipidemia    Hypothyroidism    MHA (microangiopathic hemolytic anemia) ([place])    Personal history of chemotherapy    2003   Personal history of radiation therapy    2003 right breast   Spinal stenosis    Past Surgical History:  Procedure Laterality Date   BREAST BIOPSY Right    2007 benign   BREAST LUMPECTOMY Right    2003   BTL     CATARACT EXTRACTION, BILATERAL Bilateral 03/26/2016   still blurred vision; Md states this is fine   CERVICAL LAMINECTOMY     Left   FOOT SURGERY     SHOULDER ARTHROSCOPY Right    TUBAL LIGATION  1981   Family History  Problem Relation Age of Onset   Heart attack Father    Coronary artery disease Father    Diabetes Father    Coronary artery disease Mother    Leukemia Mother    Thyroid disease Mother    Multiple sclerosis Sister        Twin   Breast cancer Maternal Aunt    Social History   Socioeconomic History   Marital status: Married    Spouse name: Not on file   Number of children: Not on file   Years of education: Not on file   Highest education level: Not on file  Occupational History   Not on file  Tobacco Use   Smoking status: Never   Smokeless tobacco: Never  Vaping Use   Vaping Use: Never used  Substance and Sexual Activity   Alcohol use: No   Drug use: No   Sexual activity: Not on file  Other Topics Concern   Not on file  Social History Narrative   Retired in March    Married for 65 years    One daughter, lives in [place]    Has four grandchildren      Likes to play with grand kids, go to [employer] and mountains.       Social Determinants of Health   Financial Resource Strain: Low Risk  (04/12/2022)   Overall Financial Resource Strain (CARDIA)    Difficulty of Paying Living Expenses: Not hard at all  Food Insecurity: No Food Insecurity  (04/12/2022)   Hunger Vital Sign    Worried About Running Out of Food in the Last Year: Never true    Ran Out of Food in the Last Year: Never true  Transportation Needs: No Transportation Needs (04/12/2022)   PRAPARE - Hydrologist (Medical): No    Lack of Transportation (Non-Medical): No  Physical Activity: Sufficiently Active (04/12/2022)   Exercise Vital Sign    Days of Exercise per Week: 3 days    Minutes of Exercise per Session: 120 min  Stress: No Stress Concern Present (04/12/2022)   [place]    Feeling of Stress : Not at all  Social Connections: Moderately Integrated (04/08/2021)   Social Connection and Isolation Panel [NHANES]    Frequency of Communication with Friends and Family: Twice a week    Frequency of Social Gatherings with Friends and Family: More than three times a week    Attends Religious Services: 1 to 4 times per year    Active Member of [employer] or Organizations: No    Attends Music therapist: Never    Marital Status: Married    Tobacco Counseling Counseling given: Not Answered   Clinical Intake:  Pre-visit preparation completed: Yes  Pain : 0-10 Pain Score: 2  Pain Type: Acute pain Pain Location: Head Pain Descriptors / Indicators: Aching Pain Onset: Today Pain Frequency: Intermittent     Nutritional Status: BMI 25 -29 Overweight Nutritional Risks: None Diabetes: No  How often do you need to have someone help you when you read instructions, pamphlets, or other written materials from your doctor or pharmacy?: 1 - Never What is the last grade level you completed in school?: 12th grade  Diabetic? no  Interpreter Needed?: No  Information entered by :: NAllen LPN   Activities of Daily Living    04/12/2022    1:33 PM  In your present state of health, do you have any difficulty performing the following activities:  Hearing? 0  Vision? 0   Difficulty concentrating or making decisions? 1  Walking or climbing stairs? 1  Dressing or bathing? 0  Doing errands, shopping? 0  Preparing Food and eating ? N  Using the Toilet? N  In the past six months, have you accidently leaked urine? N  Do you have problems with loss of bowel control? N  Managing your Medications? N  Managing your Finances? N  Housekeeping or managing your Housekeeping? N    Patient Care Team: Dorothyann Peng, NP as PCP - General (Family Medicine) Viona Gilmore, [facility] as Pharmacist (Pharmacist)  Indicate any recent Medical Services you may have received from other than [facility] providers in the past year (date may be approximate).     Assessment:   This is a routine wellness examination for Patricia Solis.  Hearing/Vision screen Vision Screening - Comments:: Regular eye exams, Dr. Gershon Crane  Dietary issues and exercise activities discussed: Current Exercise Habits: Structured exercise class, Type of exercise: calisthenics;strength training/weights;walking, Time (Minutes): > 60, Frequency (Times/Week): 3, Weekly Exercise (Minutes/Week): 0   Goals Addressed             This Visit's Progress    Patient Stated       04/12/2022, trying to keep weight down and increase exercise       Depression Screen    04/12/2022    1:33 PM 09/24/2021    1:47 PM 05/26/2021    8:27 AM 04/08/2021    3:23 PM 05/23/2020    9:11 AM 05/22/2020    7:38 AM 05/02/2018    8:46 AM  PHQ 2/9 Scores  PHQ - 2 Score 0 0 0 0 0 0 0  PHQ- 9 Score   0  0      Fall Risk    04/12/2022    1:32 PM 05/26/2021    8:06 AM 04/08/2021    3:25 PM 05/23/2020    9:10 AM 05/22/2020    7:38 AM  Fall Risk   Falls in the  past year? 1 0 0 0 0  Comment miss stepped down stairs      Number falls in past yr: 0 0 0 0 0  Injury with Fall? 1 0 0 0 0  Risk for fall due to : Medication side effect No Fall Risks Impaired vision No Fall Risks   Follow up Falls evaluation completed;Education provided;Falls  prevention discussed  Falls prevention discussed Falls evaluation completed;Falls prevention discussed     FALL RISK PREVENTION PERTAINING TO THE HOME:  Any stairs in or around the home? Yes  If so, are there any without handrails? No  Home free of loose throw rugs in walkways, pet beds, electrical cords, etc? Yes  Adequate lighting in your home to reduce risk of falls? Yes   ASSISTIVE DEVICES UTILIZED TO PREVENT FALLS:  Life alert? No  Use of a cane, walker or w/c? No  Grab bars in the bathroom? Yes  Shower chair or bench in shower? No  Elevated toilet seat or a handicapped toilet? Yes   TIMED UP AND GO:  Was the test performed? No .      Cognitive Function:        04/12/2022    1:34 PM 05/23/2020    9:13 AM  6CIT Screen  What Year? 0 points 0 points  What month? 0 points 0 points  What time? 0 points 0 points  Count back from 20 0 points 0 points  Months in reverse 0 points 0 points  Repeat phrase 0 points 0 points  Total Score 0 points 0 points    Immunizations Immunization History  Administered Date(s) Administered   Fluad Quad(high Dose 65+) 05/11/2019, 05/22/2020, 05/26/2021, 04/10/2022   Influenza Whole 07/26/2005, 04/25/2009   Influenza, High Dose Seasonal PF 04/27/2016, 04/28/2017, 05/02/2018   Influenza,inj,quad, With Preservative 04/25/2017   PFIZER(Purple Top)SARS-COV-2 Vaccination 09/01/2019, 09/26/2019, 04/21/2020, 10/22/2020   Pfizer Covid-19 Vaccine Bivalent Booster 16yr & up 04/08/2021   Pneumococcal Conjugate-13 04/27/2016   Pneumococcal Polysaccharide-23 04/28/2017   Td 07/26/2001   Tdap 09/21/2011   Zoster Recombinat (Shingrix) 04/20/2017, 08/11/2017, 09/08/2017   Zoster, Live 07/11/2012    TDAP status: Due, Education has been provided regarding the importance of this vaccine. Advised may receive this vaccine at local pharmacy or Health Dept. Aware to provide a copy of the vaccination record if obtained from local pharmacy or Health Dept.  Verbalized acceptance and understanding.  Flu Vaccine status: Up to date  Pneumococcal vaccine status: Up to date  Covid-19 vaccine status: Completed vaccines  Qualifies for Shingles Vaccine? Yes   Zostavax completed Yes   Shingrix Completed?: Yes  Screening Tests Health Maintenance  Topic Date Due   Diabetic kidney evaluation - Urine ACR  Never done   COVID-19 Vaccine (6 - Pfizer risk series) 06/03/2021   TETANUS/TDAP  09/20/2021   Diabetic kidney evaluation - GFR measurement  05/26/2022   COLONOSCOPY (Pts 45-461yrInsurance coverage will need to be confirmed)  08/26/2022   MAMMOGRAM  02/25/2024   Pneumonia Vaccine 6597Years old  Completed   INFLUENZA VACCINE  Completed   DEXA SCAN  Completed   Hepatitis C Screening  Completed   Zoster Vaccines- Shingrix  Completed   HPV VACCINES  Aged Out   FOOT EXAM  Discontinued   HEMOGLOBIN A1C  Discontinued   OPHTHALMOLOGY EXAM  Discontinued    Health Maintenance  Health Maintenance Due  Topic Date Due   Diabetic kidney evaluation - Urine ACR  Never done   COVID-19 Vaccine (  6 - Pfizer risk series) 06/03/2021   TETANUS/TDAP  09/20/2021    Colorectal cancer screening: Type of screening: Colonoscopy. Completed 08/26/2012. Repeat every 10 years  Mammogram status: Completed 02/24/2022. Repeat every year  Bone Density status: Completed 10/26/2017.   Lung Cancer Screening: (Low Dose CT Chest recommended if Age 78-80 years, 30 pack-year currently smoking OR have quit w/in 15years.) does not qualify.   Lung Cancer Screening Referral: no  Additional Screening:  Hepatitis C Screening: does qualify; Completed 04/27/2016  Vision Screening: Recommended annual ophthalmology exams for early detection of glaucoma and other disorders of the eye. Is the patient up to date with their annual eye exam?  Yes  Who is the provider or what is the name of the office in which the patient attends annual eye exams? Dr. Gershon Crane If pt is not established with  a provider, would they like to be referred to a provider to establish care? No .   Dental Screening: Recommended annual dental exams for proper oral hygiene  Community Resource Referral / Chronic Care Management: CRR required this visit?  No   CCM required this visit?  No      Plan:     I have personally reviewed and noted the following in the patient's chart:   Medical and social history Use of alcohol, tobacco or illicit drugs  Current medications and supplements including opioid prescriptions. Patient is not currently taking opioid prescriptions. Functional ability and status Nutritional status Physical activity Advanced directives List of other physicians Hospitalizations, surgeries, and ER visits in previous 12 months Vitals Screenings to include cognitive, depression, and falls Referrals and appointments  In addition, I have reviewed and discussed with patient certain preventive protocols, quality metrics, and best practice recommendations. A written personalized care plan for preventive services as well as general preventive health recommendations were provided to patient.     Kellie Simmering, LPN   [id number]   Nurse Notes: none  Due to this being a virtual visit, the after visit summary with patients personalized plan was offered to patient via mail or my-chart.  Patient would like to access on my-chart

## 2022-04-12 NOTE — Patient Instructions (Signed)
[name] , Thank you for taking time to come for your Medicare Wellness Visit. I appreciate your ongoing commitment to your health goals. Please review the following plan we discussed and let me know if I can assist you in the future.   Screening recommendations/referrals: Colonoscopy: completed 08/26/2012, due 08/26/2022 Mammogram: completed 02/24/2022, due 02/26/2023 Bone Density: completed 10/26/2017 Recommended yearly ophthalmology/optometry visit for glaucoma screening and checkup Recommended yearly dental visit for hygiene and checkup  Vaccinations: Influenza vaccine: completed 04/10/2022 Pneumococcal vaccine: completed 04/28/2017 Tdap vaccine: due Shingles vaccine: completed   Covid-19: 04/08/2021, 10/22/2020, 04/21/2020, 09/26/2019, 09/01/2019  Advanced directives: copy in chart  Conditions/risks identified: none  Next appointment: Follow up in one year for your annual wellness visit    Preventive Care 71 Years and Older, Female Preventive care refers to lifestyle choices and visits with your health care provider that can promote health and wellness. What does preventive care include? A yearly physical exam. This is also called an annual well check. Dental exams once or twice a year. Routine eye exams. Ask your health care provider how often you should have your eyes checked. Personal lifestyle choices, including: Daily care of your teeth and gums. Regular physical activity. Eating a healthy diet. Avoiding tobacco and drug use. Limiting alcohol use. Practicing safe sex. Taking low-dose aspirin every day. Taking vitamin and mineral supplements as recommended by your health care provider. What happens during an annual well check? The services and screenings done by your health care provider during your annual well check will depend on your age, overall health, lifestyle risk factors, and family history of disease. Counseling  Your health care provider may ask you questions about  your: Alcohol use. Tobacco use. Drug use. Emotional well-being. Home and relationship well-being. Sexual activity. Eating habits. History of falls. Memory and ability to understand (cognition). Work and work Statistician. Reproductive health. Screening  You may have the following tests or measurements: Height, weight, and BMI. Blood pressure. Lipid and cholesterol levels. These may be checked every 5 years, or more frequently if you are over 5 years old. Skin check. Lung cancer screening. You may have this screening every year starting at age 30 if you have a 30-pack-year history of smoking and currently smoke or have quit within the past 15 years. Fecal occult blood test (FOBT) of the stool. You may have this test every year starting at age 42. Flexible sigmoidoscopy or colonoscopy. You may have a sigmoidoscopy every 5 years or a colonoscopy every 10 years starting at age 50. Hepatitis C blood test. Hepatitis B blood test. Sexually transmitted disease (STD) testing. Diabetes screening. This is done by checking your blood sugar (glucose) after you have not eaten for a while (fasting). You may have this done every 1-3 years. Bone density scan. This is done to screen for osteoporosis. You may have this done starting at age 31. Mammogram. This may be done every 1-2 years. Talk to your health care provider about how often you should have regular mammograms. Talk with your health care provider about your test results, treatment options, and if necessary, the need for more tests. Vaccines  Your health care provider may recommend certain vaccines, such as: Influenza vaccine. This is recommended every year. Tetanus, diphtheria, and acellular pertussis (Tdap, Td) vaccine. You may need a Td booster every 10 years. Zoster vaccine. You may need this after age 60. Pneumococcal 13-valent conjugate (PCV13) vaccine. One dose is recommended after age 66. Pneumococcal polysaccharide (PPSV23) vaccine.  One dose  is recommended after age 16. Talk to your health care provider about which screenings and vaccines you need and how often you need them. This information is not intended to replace advice given to you by your health care provider. Make sure you discuss any questions you have with your health care provider. Document Released: 08/08/2015 Document Revised: 03/31/2016 Document Reviewed: 05/13/2015 Elsevier Interactive Patient Education  2017 [place] Prevention in the Home Falls can cause injuries. They can happen to people of all ages. There are many things you can do to make your home safe and to help prevent falls. What can I do on the outside of my home? Regularly fix the edges of walkways and driveways and fix any cracks. Remove anything that might make you trip as you walk through a door, such as a raised step or threshold. Trim any bushes or trees on the path to your home. Use bright outdoor lighting. Clear any walking paths of anything that might make someone trip, such as rocks or tools. Regularly check to see if handrails are loose or broken. Make sure that both sides of any steps have handrails. Any raised decks and porches should have guardrails on the edges. Have any leaves, snow, or ice cleared regularly. Use sand or salt on walking paths during winter. Clean up any spills in your garage right away. This includes oil or grease spills. What can I do in the bathroom? Use night lights. Install grab bars by the toilet and in the tub and shower. Do not use towel bars as grab bars. Use non-skid mats or decals in the tub or shower. If you need to sit down in the shower, use a plastic, non-slip stool. Keep the floor dry. Clean up any water that spills on the floor as soon as it happens. Remove soap buildup in the tub or shower regularly. Attach bath mats securely with double-sided non-slip rug tape. Do not have throw rugs and other things on the floor that can make  you trip. What can I do in the bedroom? Use night lights. Make sure that you have a light by your bed that is easy to reach. Do not use any sheets or blankets that are too big for your bed. They should not hang down onto the floor. Have a firm chair that has side arms. You can use this for support while you get dressed. Do not have throw rugs and other things on the floor that can make you trip. What can I do in the kitchen? Clean up any spills right away. Avoid walking on wet floors. Keep items that you use a lot in easy-to-reach places. If you need to reach something above you, use a strong step stool that has a grab bar. Keep electrical cords out of the way. Do not use floor polish or wax that makes floors slippery. If you must use wax, use non-skid floor wax. Do not have throw rugs and other things on the floor that can make you trip. What can I do with my stairs? Do not leave any items on the stairs. Make sure that there are handrails on both sides of the stairs and use them. Fix handrails that are broken or loose. Make sure that handrails are as long as the stairways. Check any carpeting to make sure that it is firmly attached to the stairs. Fix any carpet that is loose or worn. Avoid having throw rugs at the top or bottom of the stairs. If  you do have throw rugs, attach them to the floor with carpet tape. Make sure that you have a light switch at the top of the stairs and the bottom of the stairs. If you do not have them, ask someone to add them for you. What else can I do to help prevent falls? Wear shoes that: Do not have high heels. Have rubber bottoms. Are comfortable and fit you well. Are closed at the toe. Do not wear sandals. If you use a stepladder: Make sure that it is fully opened. Do not climb a closed stepladder. Make sure that both sides of the stepladder are locked into place. Ask someone to hold it for you, if possible. Clearly mark and make sure that you can  see: Any grab bars or handrails. First and last steps. Where the edge of each step is. Use tools that help you move around (mobility aids) if they are needed. These include: Canes. Walkers. Scooters. Crutches. Turn on the lights when you go into a dark area. Replace any light bulbs as soon as they burn out. Set up your furniture so you have a clear path. Avoid moving your furniture around. If any of your floors are uneven, fix them. If there are any pets around you, be aware of where they are. Review your medicines with your doctor. Some medicines can make you feel dizzy. This can increase your chance of falling. Ask your doctor what other things that you can do to help prevent falls. This information is not intended to replace advice given to you by your health care provider. Make sure you discuss any questions you have with your health care provider. Document Released: 05/08/2009 Document Revised: 12/18/2015 Document Reviewed: 08/16/2014 Elsevier Interactive Patient Education  2017 [employer].

## 2022-04-13 ENCOUNTER — Encounter: Payer: Self-pay | Admitting: Orthopaedic Surgery

## 2022-04-13 ENCOUNTER — Ambulatory Visit: Payer: PPO | Admitting: Orthopaedic Surgery

## 2022-04-13 VITALS — BP 111/62 | HR 78 | Ht 61.0 in | Wt 138.0 lb

## 2022-04-13 DIAGNOSIS — Z981 Arthrodesis status: Secondary | ICD-10-CM

## 2022-04-13 DIAGNOSIS — M79641 Pain in right hand: Secondary | ICD-10-CM

## 2022-04-13 NOTE — Progress Notes (Unsigned)
Office Visit Note   Patient: Patricia Solis           Date of Birth: January 25, 1951           MRN: [id number] Visit Date: 04/13/2022              Requested by: Dorothyann Peng, NP [place],  [place] [address] PCP: Dorothyann Peng, NP   Assessment & Plan: Visit Diagnoses: No diagnosis found.  Plan: ***  Follow-Up Instructions: No follow-ups on file.   Orders:  No orders of the defined types were placed in this encounter.  No orders of the defined types were placed in this encounter.     Procedures: No procedures performed   Clinical Data: No additional findings.   Subjective: Chief Complaint  Patient presents with   Right Hand - Follow-up    EMG/NCS review    HPI  Review of Systems   Objective: Vital Signs: BP 111/62   Pulse 78   Ht '5\' 1"'$  (1.549 m)   Wt 138 lb (62.6 kg)   BMI 26.07 kg/m   Physical Exam  Ortho Exam  Specialty Comments:  No specialty comments available.  Imaging: No results found.   PMFS History: Patient Active Problem List   Diagnosis Date Noted   Trigger finger, right ring finger 03/17/2022   Trigger finger, right middle finger 03/17/2022   Carpal tunnel syndrome, right upper limb 02/16/2022   Chondromalacia patellae, left knee 06/09/2021   Other spondylosis with radiculopathy, cervical region 05/11/2021   History of fusion of cervical spine 05/11/2021   Pes anserine bursitis 05/11/2021   GERD (gastroesophageal reflux disease) 09/29/2020   Esophageal stricture 01/26/2011   Arrhythmia 12/29/2010   Essential hypertension 07/21/2010   CONTACT DERMATITIS&OTHER ECZEMA DUE UNSPEC CAUSE 04/07/2010   Hyperlipidemia 09/11/2007   Migraine headache 09/11/2007   Hypothyroidism 01/26/2007   Past Medical History:  Diagnosis Date   Basal cell carcinoma 2007   BCC   Breast cancer ([place])    Invasive lobular carcinoma RIGHT breast   Carotid bruit 08/2008   Normal Doppler   DUB (dysfunctional uterine bleeding)     Esophagitis    HTN (hypertension)    Tx started 06/2010   Hyperlipidemia    Hypothyroidism    MHA (microangiopathic hemolytic anemia) (HCC)    Personal history of chemotherapy    2003   Personal history of radiation therapy    2003 right breast   Spinal stenosis     Family History  Problem Relation Age of Onset   Heart attack Father    Coronary artery disease Father    Diabetes Father    Coronary artery disease Mother    Leukemia Mother    Thyroid disease Mother    Multiple sclerosis Sister        Twin   Breast cancer Maternal Aunt     Past Surgical History:  Procedure Laterality Date   BREAST BIOPSY Right    2007 benign   BREAST LUMPECTOMY Right    2003   BTL     CATARACT EXTRACTION, BILATERAL Bilateral 03/26/2016   still blurred vision; Md states this is fine   CERVICAL LAMINECTOMY     Left   FOOT SURGERY     SHOULDER ARTHROSCOPY Right    TUBAL LIGATION  1981   Social History   Occupational History   Not on file  Tobacco Use   Smoking status: Never   Smokeless tobacco: Never  Vaping Use  Vaping Use: Never used  Substance and Sexual Activity   Alcohol use: No   Drug use: No   Sexual activity: Not on file

## 2022-04-15 DIAGNOSIS — M79641 Pain in right hand: Secondary | ICD-10-CM | POA: Insufficient documentation

## 2022-04-26 ENCOUNTER — Other Ambulatory Visit: Payer: Self-pay | Admitting: Adult Health

## 2022-04-26 DIAGNOSIS — E039 Hypothyroidism, unspecified: Secondary | ICD-10-CM

## 2022-04-26 DIAGNOSIS — Z760 Encounter for issue of repeat prescription: Secondary | ICD-10-CM

## 2022-05-27 ENCOUNTER — Encounter: Payer: Self-pay | Admitting: Adult Health

## 2022-05-27 ENCOUNTER — Ambulatory Visit (INDEPENDENT_AMBULATORY_CARE_PROVIDER_SITE_OTHER): Payer: PPO | Admitting: Adult Health

## 2022-05-27 VITALS — BP 130/70 | HR 80 | Temp 98.2°F | Wt 136.0 lb

## 2022-05-27 DIAGNOSIS — G43811 Other migraine, intractable, with status migrainosus: Secondary | ICD-10-CM

## 2022-05-27 DIAGNOSIS — K219 Gastro-esophageal reflux disease without esophagitis: Secondary | ICD-10-CM

## 2022-05-27 DIAGNOSIS — E039 Hypothyroidism, unspecified: Secondary | ICD-10-CM

## 2022-05-27 DIAGNOSIS — Z0000 Encounter for general adult medical examination without abnormal findings: Secondary | ICD-10-CM

## 2022-05-27 DIAGNOSIS — E782 Mixed hyperlipidemia: Secondary | ICD-10-CM

## 2022-05-27 LAB — COMPREHENSIVE METABOLIC PANEL
ALT: 19 U/L (ref 0–35)
AST: 20 U/L (ref 0–37)
Albumin: 4.5 g/dL (ref 3.5–5.2)
Alkaline Phosphatase: 62 U/L (ref 39–117)
BUN: 17 mg/dL (ref 6–23)
CO2: 30 mEq/L (ref 19–32)
Calcium: 9.8 mg/dL (ref 8.4–10.5)
Chloride: 100 mEq/L (ref 96–112)
Creatinine, Ser: 0.6 mg/dL (ref 0.40–1.20)
GFR: 90.41 mL/min (ref >60.00)
Glucose, Bld: 78 mg/dL (ref 70–99)
Potassium: 4.1 mEq/L (ref 3.5–5.1)
Sodium: 139 mEq/L (ref 135–145)
Total Bilirubin: 0.4 mg/dL (ref 0.2–1.2)
Total Protein: 7.5 g/dL (ref 6.0–8.3)

## 2022-05-27 LAB — LIPID PANEL
Cholesterol: 174 mg/dL (ref 0–200)
HDL: 75.3 mg/dL (ref >39.00)
LDL Cholesterol: 89 mg/dL (ref 0–99)
NonHDL: 98.3
Total CHOL/HDL Ratio: 2
Triglycerides: 49 mg/dL (ref 0.0–149.0)
VLDL: 9.8 mg/dL (ref 0.0–40.0)

## 2022-05-27 LAB — CBC WITH DIFFERENTIAL/PLATELET
Basophils Absolute: 0.1 10*3/uL (ref 0.0–0.1)
Basophils Relative: 0.9 % (ref 0.0–3.0)
Eosinophils Absolute: 0.2 10*3/uL (ref 0.0–0.7)
Eosinophils Relative: 3.7 % (ref 0.0–5.0)
HCT: 40.3 % (ref 36.0–46.0)
Hemoglobin: 13.7 g/dL (ref 12.0–15.0)
Lymphocytes Relative: 28.9 % (ref 12.0–46.0)
Lymphs Abs: 1.9 10*3/uL (ref 0.7–4.0)
MCHC: 34 g/dL (ref 30.0–36.0)
MCV: 92.5 fl (ref 78.0–100.0)
Monocytes Absolute: 0.4 10*3/uL (ref 0.1–1.0)
Monocytes Relative: 7 % (ref 3.0–12.0)
Neutro Abs: 3.8 10*3/uL (ref 1.4–7.7)
Neutrophils Relative %: 59.5 % (ref 43.0–77.0)
Platelets: 260 10*3/uL (ref 150.0–400.0)
RBC: 4.36 Mil/uL (ref 3.87–5.11)
RDW: 14.2 % (ref 11.5–15.5)
WBC: 6.5 10*3/uL (ref 4.0–10.5)

## 2022-05-27 LAB — TSH: TSH: 2.74 u[IU]/mL (ref 0.35–5.50)

## 2022-05-27 MED ORDER — HYDROCORTISONE 2.5 % EX OINT: TOPICAL_OINTMENT | Freq: Two times a day (BID) | CUTANEOUS | 2 refills | Status: DC

## 2022-05-27 NOTE — Progress Notes (Signed)
Subjective:    Patient ID: Patricia Solis, female    DOB: 05-28-1951, 71 y.o.   MRN: [id number]  HPI Patient presents for yearly preventative medicine examination. She is a pleasant 71 year old female who  has a past medical history of Basal cell carcinoma (2007), Breast cancer ([place]), Carotid bruit (08/2008), DUB (dysfunctional uterine bleeding), Esophagitis, HTN (hypertension), Hyperlipidemia, Hypothyroidism, MHA (microangiopathic hemolytic anemia) ([place]), Personal history of chemotherapy, Personal history of radiation therapy, and Spinal stenosis.  Hypertension - managed with HCTZ 12.5 mg and Cozaar 25 mg daily.  She does check her blood pressure at home and reports readings in the 120s to 130s over 70s to 80s.  She denies dizziness, lightheadedness, chest pain, or shortness of breath  BP Readings from Last 3 Encounters:  05/27/22 130/70  04/13/22 111/62  03/16/22 121/69   Hypothyroidism-maintained on Synthroid 88 mcg daily Lab Results  Component Value Date   TSH 0.93 05/26/2021   Hyperlipidemia-managed with simvastatin 40 mg daily.  She denies myalgia or fatigue Lab Results  Component Value Date   CHOL 178 05/26/2021   HDL 67.70 05/26/2021   LDLCALC 93 05/26/2021   LDLDIRECT 140.4 09/02/2006   TRIG 85.0 05/26/2021   CHOLHDL 3 05/26/2021   GERD-controlled with Prilosec 20 mg daily  Migraine headaches-infrequent migraine headaches.  She has been seen by neurology in the past but did not have good results with the medications that they prescribed.  When she does have a migraine headache she will use BC powder at home and this seems to work better than prescription medications for her  All immunizations and health maintenance protocols were reviewed with the patient and needed orders were placed.  Appropriate screening laboratory values were ordered for the patient including screening of hyperlipidemia, renal function and hepatic function.  Medication reconciliation,  past  medical history, social history, problem list and allergies were reviewed in detail with the patient  Goals were established with regard to weight loss, exercise, and  diet in compliance with medications. She is working out at [employer] three days a week. She does eat healthy   Due for colonoscopy in Feb 2024- Done at [facility]   Review of Systems  Constitutional: Negative.   HENT: Negative.    Eyes: Negative.   Respiratory: Negative.    Cardiovascular: Negative.   Gastrointestinal: Negative.   Endocrine: Negative.   Genitourinary: Negative.   Musculoskeletal:  Positive for arthralgias, back pain, neck pain and neck stiffness.  Skin: Negative.   Allergic/Immunologic: Negative.   Neurological: Negative.   Hematological: Negative.   Psychiatric/Behavioral: Negative.     Past Medical History:  Diagnosis Date   Basal cell carcinoma 2007   BCC   Breast cancer ([place])    Invasive lobular carcinoma RIGHT breast   Carotid bruit 08/2008   Normal Doppler   DUB (dysfunctional uterine bleeding)    Esophagitis    HTN (hypertension)    Tx started 06/2010   Hyperlipidemia    Hypothyroidism    MHA (microangiopathic hemolytic anemia) (HCC)    Personal history of chemotherapy    2003   Personal history of radiation therapy    2003 right breast   Spinal stenosis     Social History   Socioeconomic History   Marital status: Married    Spouse name: Not on file   Number of children: Not on file   Years of education: Not on file   Highest education level: Not on file  Occupational  History   Not on file  Tobacco Use   Smoking status: Never   Smokeless tobacco: Never  Vaping Use   Vaping Use: Never used  Substance and Sexual Activity   Alcohol use: No   Drug use: No   Sexual activity: Not on file  Other Topics Concern   Not on file  Social History Narrative   Retired in March    Married for 34 years    One daughter, lives in [place]    Has four grandchildren      Likes to play with  grand kids, go to [employer] and mountains.       Social Determinants of Health   Financial Resource Strain: Low Risk  (04/12/2022)   Overall Financial Resource Strain (CARDIA)    Difficulty of Paying Living Expenses: Not hard at all  Food Insecurity: No Food Insecurity (04/12/2022)   Hunger Vital Sign    Worried About Running Out of Food in the Last Year: Never true    Ran Out of Food in the Last Year: Never true  Transportation Needs: No Transportation Needs (04/12/2022)   PRAPARE - Hydrologist (Medical): No    Lack of Transportation (Non-Medical): No  Physical Activity: Sufficiently Active (04/12/2022)   Exercise Vital Sign    Days of Exercise per Week: 3 days    Minutes of Exercise per Session: 120 min  Stress: No Stress Concern Present (04/12/2022)   [place]    Feeling of Stress : Not at all  Social Connections: Moderately Integrated (04/08/2021)   Social Connection and Isolation Panel [NHANES]    Frequency of Communication with Friends and Family: Twice a week    Frequency of Social Gatherings with Friends and Family: More than three times a week    Attends Religious Services: 1 to 4 times per year    Active Member of [employer] or Organizations: No    Attends Archivist Meetings: Never    Marital Status: Married  Human resources officer Violence: Not At Risk (04/08/2021)   Humiliation, Afraid, Rape, and Kick questionnaire    Fear of Current or Ex-Partner: No    Emotionally Abused: No    Physically Abused: No    Sexually Abused: No    Past Surgical History:  Procedure Laterality Date   BREAST BIOPSY Right    2007 benign   BREAST LUMPECTOMY Right    2003   BTL     CATARACT EXTRACTION, BILATERAL Bilateral 03/26/2016   still blurred vision; Md states this is fine   CERVICAL LAMINECTOMY     Left   FOOT SURGERY     SHOULDER ARTHROSCOPY Right    TUBAL LIGATION  1981    Family  History  Problem Relation Age of Onset   Heart attack Father    Coronary artery disease Father    Diabetes Father    Coronary artery disease Mother    Leukemia Mother    Thyroid disease Mother    Multiple sclerosis Sister        Twin   Breast cancer Maternal Aunt     Allergies  Allergen Reactions   Ambien Cr [Zolpidem] Other (See Comments)    Hallucinations   Cozaar [Losartan] Cough   Erythromycin     REACTION: rash   Hydrocodone Other (See Comments)    Hallucinations   Lisinopril Cough   Methyldibromoglutaronitrile Dermatitis   Penicillins  REACTION: rash   Bromine Rash    Per dermatologist    Clindamycin/Lincomycin Rash    Current Outpatient Medications on File Prior to Visit  Medication Sig Dispense Refill   aspirin 81 MG EC tablet Take 81 mg by mouth daily.     B Complex-C (B-COMPLEX WITH VITAMIN C) tablet Take 1 tablet by mouth daily.     calcium carbonate (OS-CAL) 600 MG TABS tablet Take 1 tablet (600 mg total) by mouth 2 (two) times daily with a meal. 180 tablet 3   Cholecalciferol (VITAMIN D3 PO) Take by mouth.     Coenzyme Q10 200 MG TABS Take 200 mg by mouth daily.     CRANBERRY PO Take 2 tablets by mouth at bedtime.     famotidine (PEPCID) 40 MG tablet Take 1 tablet (40 mg total) by mouth at bedtime. 90 tablet 3   GLUCOSAMINE PO Take 1 tablet by mouth 2 (two) times daily.     hydrochlorothiazide (MICROZIDE) 12.5 MG capsule Take 1 capsule by mouth once daily 90 capsule 1   levothyroxine (SYNTHROID) 75 MCG tablet TAKE 1 TABLET BY MOUTH ONCE DAILY BEFORE BREAKFAST 90 tablet 1   magnesium oxide (MAG-OX) 400 MG tablet Take 400 mg by mouth daily.     omeprazole (PRILOSEC) 20 MG capsule Take 1 capsule by mouth once daily 90 capsule 0   rosuvastatin (CRESTOR) 40 MG tablet Take 1 tablet by mouth once daily 90 tablet 3   tretinoin (RETIN-A) 0.01 % gel Apply topically at bedtime. 45 g 0   No current facility-administered medications on file prior to visit.    BP  130/70   Pulse 80   Temp 98.2 F (36.8 C)   Wt 136 lb (61.7 kg)   SpO2 97%   BMI 25.70 kg/m       Objective:   Physical Exam Vitals and nursing note reviewed.  Constitutional:      General: She is not in acute distress.    Appearance: Normal appearance. She is well-developed. She is not ill-appearing.  HENT:     Head: Normocephalic and atraumatic.     Right Ear: Tympanic membrane, ear canal and external ear normal. There is no impacted cerumen.     Left Ear: Tympanic membrane, ear canal and external ear normal. There is no impacted cerumen.     Nose: Nose normal. No congestion or rhinorrhea.     Mouth/Throat:     Mouth: Mucous membranes are moist.     Pharynx: Oropharynx is clear. No oropharyngeal exudate or posterior oropharyngeal erythema.  Eyes:     General:        Right eye: No discharge.        Left eye: No discharge.     Extraocular Movements: Extraocular movements intact.     Conjunctiva/sclera: Conjunctivae normal.     Pupils: Pupils are equal, round, and reactive to light.  Neck:     Thyroid: No thyromegaly.     Vascular: No carotid bruit.     Trachea: No tracheal deviation.  Cardiovascular:     Rate and Rhythm: Normal rate and regular rhythm.     Pulses: Normal pulses.     Heart sounds: Normal heart sounds. No murmur heard.    No friction rub. No gallop.  Pulmonary:     Effort: Pulmonary effort is normal. No respiratory distress.     Breath sounds: Normal breath sounds. No stridor. No wheezing, rhonchi or rales.  Chest:  Chest wall: No tenderness.  Abdominal:     General: Abdomen is flat. Bowel sounds are normal. There is no distension.     Palpations: Abdomen is soft. There is no mass.     Tenderness: There is no abdominal tenderness. There is no right CVA tenderness, left CVA tenderness, guarding or rebound.     Hernia: No hernia is present.  Musculoskeletal:        General: No swelling, tenderness, deformity or signs of injury. Normal range of  motion.     Cervical back: Normal range of motion and neck supple.     Right lower leg: No edema.     Left lower leg: No edema.     Comments: Trigger fingers of right hand ( being seen by orthopedics)   Lymphadenopathy:     Cervical: No cervical adenopathy.  Skin:    General: Skin is warm and dry.     Coloration: Skin is not jaundiced or pale.     Findings: No bruising, erythema, lesion or rash.  Neurological:     General: No focal deficit present.     Mental Status: She is alert and oriented to person, place, and time.     Cranial Nerves: No cranial nerve deficit.     Sensory: No sensory deficit.     Motor: No weakness.     Coordination: Coordination normal.     Gait: Gait normal.     Deep Tendon Reflexes: Reflexes normal.  Psychiatric:        Mood and Affect: Mood normal.        Behavior: Behavior normal.        Thought Content: Thought content normal.        Judgment: Judgment normal.       Assessment & Plan:  1. Routine general medical examination at a health care facility - Continue to exercise and eat healthy  - Follow up in one year or sooner if needed - CBC with Differential/Platelet; Future - Comprehensive metabolic panel; Future - Lipid panel; Future - TSH; Future - TSH - Lipid panel - Comprehensive metabolic panel - CBC with Differential/Platelet  2. Mixed hyperlipidemia - Consider change in statin  - CBC with Differential/Platelet; Future - Comprehensive metabolic panel; Future - Lipid panel; Future - TSH; Future - TSH - Lipid panel - Comprehensive metabolic panel - CBC with Differential/Platelet  3. Hypothyroidism, unspecified type - Consider increase in synthroid  - CBC with Differential/Platelet; Future - Comprehensive metabolic panel; Future - Lipid panel; Future - TSH; Future - TSH - Lipid panel - Comprehensive metabolic panel - CBC with Differential/Platelet  4. Gastroesophageal reflux disease, unspecified whether esophagitis present -  Continue PPI  - CBC with Differential/Platelet; Future - Comprehensive metabolic panel; Future - Lipid panel; Future - TSH; Future - TSH - Lipid panel - Comprehensive metabolic panel - CBC with Differential/Platelet  5. Other migraine with status migrainosus, intractable  - CBC with Differential/Platelet; Future - Comprehensive metabolic panel; Future - Lipid panel; Future - TSH; Future - TSH - Lipid panel - Comprehensive metabolic panel - CBC with Differential/Platelet  Dorothyann Peng, NP

## 2022-05-27 NOTE — Patient Instructions (Signed)
It was great seeing you today   We will follow up with you regarding your lab work   Please let me know if you need anything   

## 2022-05-28 ENCOUNTER — Encounter: Payer: Self-pay | Admitting: Adult Health

## 2022-05-30 ENCOUNTER — Other Ambulatory Visit: Payer: Self-pay | Admitting: Adult Health

## 2022-06-14 ENCOUNTER — Other Ambulatory Visit: Payer: Self-pay | Admitting: Adult Health

## 2022-07-05 ENCOUNTER — Telehealth: Payer: Self-pay | Admitting: Pharmacist

## 2022-07-05 NOTE — Chronic Care Management (AMB) (Signed)
    Chronic Care Management Pharmacy Assistant   Name: Patricia Solis  MRN: [id number] DOB: 03/03/1951   07/05/22 APPOINTMENT REMINDER   Called Patient No answer, left message of appointment on 07/06/22  at 3 via telephone visit with Jeni Salles, Pharm D.   Notified to have all medications, supplements, blood pressure and/or blood sugar logs available during appointment and to return call if need to reschedule.    Care Gaps: COVID booster - Overdue Diabetic Urine - Postponed BP- 130/70 05/27/22 AWV- 04/12/22 Lab Results  Component Value Date   HGBA1C 5.4 05/22/2020    Star Rating Drug: Rosuvastatin 40 mg - Last filled 04/18/22 90 DS at [facility]     Medications: Outpatient Encounter Medications as of 07/05/2022  Medication Sig Note   aspirin 81 MG EC tablet Take 81 mg by mouth daily.    B Complex-C (B-COMPLEX WITH VITAMIN C) tablet Take 1 tablet by mouth daily.    calcium carbonate (OS-CAL) 600 MG TABS tablet Take 1 tablet (600 mg total) by mouth 2 (two) times daily with a meal.    Cholecalciferol (VITAMIN D3 PO) Take by mouth. 06/30/2020: 5,000 iu qd   Coenzyme Q10 200 MG TABS Take 200 mg by mouth daily.    CRANBERRY PO Take 2 tablets by mouth at bedtime.    famotidine (PEPCID) 40 MG tablet TAKE 1 TABLET BY MOUTH AT BEDTIME    GLUCOSAMINE PO Take 1 tablet by mouth 2 (two) times daily.    hydrochlorothiazide (MICROZIDE) 12.5 MG capsule Take 1 capsule by mouth once daily    hydrocortisone 2.5 % ointment Apply topically 2 (two) times daily.    levothyroxine (SYNTHROID) 75 MCG tablet TAKE 1 TABLET BY MOUTH ONCE DAILY BEFORE BREAKFAST    magnesium oxide (MAG-OX) 400 MG tablet Take 400 mg by mouth daily.    omeprazole (PRILOSEC) 20 MG capsule Take 1 capsule by mouth once daily    rosuvastatin (CRESTOR) 40 MG tablet Take 1 tablet by mouth once daily    tretinoin (RETIN-A) 0.01 % gel Apply topically at bedtime.    No facility-administered encounter medications on file as of  07/05/2022.      [place] Clinical Pharmacist Assistant [phone]

## 2022-07-06 ENCOUNTER — Ambulatory Visit (INDEPENDENT_AMBULATORY_CARE_PROVIDER_SITE_OTHER): Payer: PPO | Admitting: Pharmacist

## 2022-07-06 DIAGNOSIS — I10 Essential (primary) hypertension: Secondary | ICD-10-CM

## 2022-07-06 DIAGNOSIS — E782 Mixed hyperlipidemia: Secondary | ICD-10-CM

## 2022-07-06 NOTE — Patient Instructions (Signed)
Hi Evlyn,  It was great to speak with you again! I will let you know what Tommi Rumps says when I hear back.  Please reach out to me if you have any questions or need anything before then!  Best, Maddie  Jeni Salles, PharmD, [place] [facility] [place]   Visit Information   Goals Addressed   None    Patient Care Plan: CCM Pharmacy Care Plan     Problem Identified: Problem: Hypertension, Hyperlipidemia, GERD, Hypothyroidism and Prevention of heart events      Long-Range Goal: Patient-Specific Goal   Start Date: 11/27/2020  Expected End Date: 11/27/2021  Recent Progress: On track  Priority: High  Note:   Current Barriers:  Unable to independently monitor therapeutic efficacy  Pharmacist Clinical Goal(s):  Patient will achieve adherence to monitoring guidelines and medication adherence to achieve therapeutic efficacy through collaboration with PharmD and provider.   Interventions: 1:1 collaboration with Dorothyann Peng, NP regarding development and update of comprehensive plan of care as evidenced by provider attestation and co-signature Inter-disciplinary care team collaboration (see longitudinal plan of care) Comprehensive medication review performed; medication list updated in electronic medical record  Hypertension (BP goal <140/90) -Controlled -Current treatment: Hydrochlorothiazide 25 mg 1 tablet daily - Appropriate, Effective, Safe, Accessible -Medications previously tried: none -Current home readings: 114-126/60-65 (checking once a week) -Current dietary habits: puts some salt in foods but not on the table (only to season) -Current exercise habits: active with the [facility] and goes every Monday, Wednesday, and Friday -Denies hypotensive/hypertensive symptoms -Educated on Exercise goal of 150 minutes per week; Importance of home blood pressure monitoring; Proper BP monitoring technique; -Counseled to monitor BP at home weekly,  document, and provide log at future appointments -Counseled on diet and exercise extensively Recommended to continue current medication  Hyperlipidemia: (LDL goal < 100) -Controlled -Current treatment: Rosuvastatin 40 mg 1 tablet daily - Appropriate, Effective, Safe, Accessible -Medications previously tried: simvastatin (ineffective) -Current dietary patterns: did not discuss -Current exercise habits: active with the [facility] and goes every Monday, Wednesday, and Friday -Educated on Cholesterol goals;  Importance of limiting foods high in cholesterol; Exercise goal of 150 minutes per week; -Counseled on diet and exercise extensively Recommended to continue current medication Recommended repeat lipid panel  GERD (Goal: minimize symptoms) -Controlled -Current treatment  Omeprazole 20 mg 1 capsule daily - Appropriate, Effective, Safe, Accessible Famotidine 20 mg 1 tablet at bedtime - Appropriate, Effective, Safe, Accessible -Medications previously tried: none  -Counseled on Non-pharmacologic management of symptoms such as elevating the head of your bed, avoiding eating 2-3 hours before bed, avoiding triggering foods such as acidic, spicy, or fatty foods, eating smaller meals, and wearing clothes that are loose around the waist  Health Maintenance -Vaccine gaps: none -Current therapy:  Aspirin 81 mg 1 tablet daily Vitamin B complex w/vitamin C 1 tablet daily Calcium carbonate 600 mg 1 tablet twice daily Vitamin D3 5000 units 1 tablet daily Coenzyme Q10 30 mg 1 capsule daily Cranberry daily Glucosamine 1 tablet twice daily Hydrocortisone 2.5% ointment apply twice daily Magnesium oxide 400 mg 1 tablet daily -Educated on Cost vs benefit of each product must be carefully weighed by individual consumer -Patient is satisfied with current therapy and denies issues -Recommended to continue current medication Counseled on risk vs benefit with aspirin for primary prevention.  Patient  Goals/Self-Care Activities Patient will:  - take medications as prescribed check blood pressure weekly, document, and provide at future appointments target a minimum of 150  minutes of moderate intensity exercise weekly  Follow Up Plan: The patient has been provided with contact information for the care management team and has been advised to call with any health related questions or concerns.         Patient verbalizes understanding of instructions and care plan provided today and agrees to view in [place]. Active MyChart status and patient understanding of how to access instructions and care plan via MyChart confirmed with patient.    The pharmacy team will reach out to the patient again over the next 7 days.   Viona Gilmore, [facility]

## 2022-07-06 NOTE — Progress Notes (Signed)
Chronic Care Management Pharmacy Note  07/06/2022 Name:  Patricia Solis MRN:  [id number] DOB:  04/11/1951  Summary: LDL at goal < 100 BP at goal of < 140/90 per home readings   Recommendations/Changes made from today's visit: -Recommended continued BP monitoring at home -Recommended stopping aspirin for primary prevention   Plan: Follow up as needed  Subjective: Patricia Solis is an 71 y.o. year old female who is a primary patient of Dorothyann Peng, NP.  The CCM team was consulted for assistance with disease management and care coordination needs.    Engaged with patient by telephone for follow up visit in response to provider referral for pharmacy case management and/or care coordination services.   Consent to Services:  The patient was given information about Chronic Care Management services, agreed to services, and gave verbal consent prior to initiation of services.  Please see initial visit note for detailed documentation.   Patient Care Team: Dorothyann Peng, NP as PCP - General (Family Medicine) Viona Gilmore, [facility] as Pharmacist (Pharmacist)  Recent office visits: 05/27/22 Dorothyann Peng, NP: Patient presented for annual exam.  No medication changes.  04/12/22 Willette Brace, LPN: Patient presented for Medicare Annual Wellness Exam.  01/19/22 Carolann Littler, MD: Patient presented for foot injury post fall. Plan for X-ray.   Recent consult visits: 04/13/22 Rodell Perna, MD (ortho): Patient presented for right hand pain follow up.   03/16/22 Rodell Perna, MD (ortho): Patient presented for right hand pain follow up. Referred to physical medicine rehab.  03/15/22 Dian Queen (OBGYN): Patient presented for annual follow up. Unable to access notes.  02/16/22 Rodell Perna, MD (ortho): Patient presented for right hand pain follow up. Plan for wrist splint x 4 weeks and then follow up.   Hospital visits: None in previous 6 months  Objective:  Lab Results   Component Value Date   CREATININE 0.60 05/27/2022   BUN 17 05/27/2022   GFR 90.41 05/27/2022   GFRNONAA 91 05/22/2020   GFRAA 106 05/22/2020   NA 139 05/27/2022   K 4.1 05/27/2022   CALCIUM 9.8 05/27/2022   CO2 30 05/27/2022   GLUCOSE 78 05/27/2022    Lab Results  Component Value Date/Time   HGBA1C 5.4 05/22/2020 08:26 AM   GFR 90.41 05/27/2022 09:42 AM   GFR 86.25 05/26/2021 08:33 AM    Last diabetic Eye exam:  Lab Results  Component Value Date/Time   HMDIABEYEEXA No Retinopathy 03/04/2018 12:00 AM    Last diabetic Foot exam: No results found for: "HMDIABFOOTEX"   Lab Results  Component Value Date   CHOL 174 05/27/2022   HDL 75.30 05/27/2022   LDLCALC 89 05/27/2022   LDLDIRECT 140.4 09/02/2006   TRIG 49.0 05/27/2022   CHOLHDL 2 05/27/2022       Latest Ref Rng & Units 05/27/2022    9:42 AM 05/26/2021    8:33 AM 05/22/2020    8:26 AM  Hepatic Function  Total Protein 6.0 - 8.3 g/dL 7.5  7.4  7.0   Albumin 3.5 - 5.2 g/dL 4.5  4.5    AST 0 - 37 U/L _0 ALT 0 - 35 U/L _1 Alk Phosphatase 39 - 117 U/L 62  64    Total Bilirubin 0.2 - 1.2 mg/dL 0.4  0.5  0.5     Lab Results  Component Value Date/Time   TSH 2.74 05/27/2022 09:42 AM   TSH 0.93 05/26/2021  08:33 AM   FREET4 0.80 12/29/2010 04:56 PM       Latest Ref Rng & Units 05/27/2022    9:42 AM 05/26/2021    8:33 AM 05/22/2020    8:26 AM  CBC  WBC 4.0 - 10.5 K/uL 6.5  6.2  5.8   Hemoglobin 12.0 - 15.0 g/dL 13.7  13.9  14.3   Hematocrit 36.0 - 46.0 % 40.3  41.5  43.0   Platelets 150.0 - 400.0 K/uL 260.0  273.0  304     Lab Results  Component Value Date/Time   VD25OH 47 05/22/2020 08:26 AM   VD25OH 26 (L) 03/26/2020 09:51 AM   VD25OH 43.86 04/27/2016 07:43 AM    Clinical ASCVD: No  The 10-year ASCVD risk score (Arnett DK, et al., 2019) is: 24.1%   Values used to calculate the score:     Age: 46 years     Sex: Female     Is Non-Hispanic African American: No     Diabetic: Yes      Tobacco smoker: No     Systolic Blood Pressure: 371 mmHg     Is BP treated: Yes     HDL Cholesterol: 75.3 mg/dL     Total Cholesterol: 174 mg/dL       04/12/2022    1:33 PM 09/24/2021    1:47 PM 05/26/2021    8:27 AM  Depression screen PHQ 2/9  Decreased Interest 0 0 0  Down, Depressed, Hopeless 0 0 0  PHQ - 2 Score 0 0 0  Altered sleeping   0  Tired, decreased energy   0  Change in appetite   0  Feeling bad or failure about yourself    0  Trouble concentrating   0  Moving slowly or fidgety/restless   0  Suicidal thoughts   0  PHQ-9 Score   0      Social History   Tobacco Use  Smoking Status Never  Smokeless Tobacco Never   BP Readings from Last 3 Encounters:  05/27/22 130/70  04/13/22 111/62  03/16/22 121/69   Pulse Readings from Last 3 Encounters:  05/27/22 80  04/13/22 78  03/16/22 82   Wt Readings from Last 3 Encounters:  05/27/22 136 lb (61.7 kg)  04/13/22 138 lb (62.6 kg)  04/12/22 138 lb (62.6 kg)   BMI Readings from Last 3 Encounters:  05/27/22 25.70 kg/m  04/13/22 26.07 kg/m  04/12/22 26.07 kg/m    Assessment/Interventions: Review of patient past medical history, allergies, medications, health status, including review of consultants reports, laboratory and other test data, was performed as part of comprehensive evaluation and provision of chronic care management services.   SDOH:  (Social Determinants of Health) assessments and interventions performed: Yes  SDOH Interventions    Flowsheet Row Chronic Care Management from 07/06/2022 in [place] at [place] from 04/12/2022 in [place] at [place] Management from 07/30/2020 in [place] at [place] from 05/23/2020 in [place] at [place] Interventions -- Intervention Not Indicated -- Intervention Not Indicated  Housing Interventions -- -- -- Intervention Not Indicated   Transportation Interventions -- Intervention Not Indicated Intervention Not Indicated Intervention Not Indicated  Depression Interventions/Treatment  -- -- -- PHQ2-9 Score <4 Follow-up Not Indicated  Financial Strain Interventions Intervention Not Indicated Intervention Not Indicated Intervention Not Indicated Intervention Not Indicated  Physical Activity Interventions -- Intervention Not Indicated -- Intervention Not  Indicated  Stress Interventions -- Intervention Not Indicated -- Intervention Not Indicated  Social Connections Interventions -- -- -- Intervention Not Indicated      SDOH Screenings   Food Insecurity: No Food Insecurity (04/12/2022)  Housing: Low Risk  (04/08/2021)  Transportation Needs: No Transportation Needs (04/12/2022)  Alcohol Screen: Low Risk  (05/23/2020)  Depression (PHQ2-9): Low Risk  (04/12/2022)  Financial Resource Strain: Low Risk  (07/06/2022)  Physical Activity: Sufficiently Active (04/12/2022)  Social Connections: Moderately Integrated (04/08/2021)  Stress: No Stress Concern Present (04/12/2022)  Tobacco Use: Low Risk  (05/27/2022)    CCM Care Plan  Allergies  Allergen Reactions   Ambien Cr [Zolpidem] Other (See Comments)    Hallucinations   Cozaar [Losartan] Cough   Erythromycin     REACTION: rash   Hydrocodone Other (See Comments)    Hallucinations   Lisinopril Cough   Methyldibromoglutaronitrile Dermatitis   Penicillins     REACTION: rash   Bromine Rash    Per dermatologist    Clindamycin/Lincomycin Rash    Medications Reviewed Today     Reviewed by Dorothyann Peng, NP (Nurse Practitioner) on 05/27/22 at [place] List Status: <None>   Medication Order Taking? Sig Documenting Provider Last Dose Status Informant  aspirin 81 MG EC tablet [id number] Yes Take 81 mg by mouth daily. [provider] Taking Active Self  B Complex-C (B-COMPLEX WITH VITAMIN C) tablet [id number] Yes Take 1 tablet by mouth daily. [provider] Taking  Active Self  calcium carbonate (OS-CAL) 600 MG TABS tablet [id number] Yes Take 1 tablet (600 mg total) by mouth 2 (two) times daily with a meal. Nafziger, Tommi Rumps, NP Taking Active Self  Cholecalciferol (VITAMIN D3 PO) [id number] Yes Take by mouth. [provider] Taking Active            Med Note Marlyne Beards   Mon Jun 30, 2020  9:15 AM) 5,000 iu qd  Coenzyme Q10 200 MG TABS [id number] Yes Take 200 mg by mouth daily. [provider] Taking Active   CRANBERRY PO [id number] Yes Take 2 tablets by mouth at bedtime. [provider] Taking Active   famotidine (PEPCID) 40 MG tablet [id number] Yes Take 1 tablet (40 mg total) by mouth at bedtime. Dorothyann Peng, NP Taking Active   GLUCOSAMINE PO [id number] Yes Take 1 tablet by mouth 2 (two) times daily. [provider] Taking Active Self  hydrochlorothiazide (MICROZIDE) 12.5 MG capsule [id number] Yes Take 1 capsule by mouth once daily Nafziger, Tommi Rumps, NP Taking Active   hydrocortisone 2.5 % ointment [id number] Yes Apply topically 2 (two) times daily. [provider] Taking Active            Med Note Standley Brooking Mar 26, 2020  9:26 AM) Prn skin rash  levothyroxine (SYNTHROID) 75 MCG tablet [id number] Yes TAKE 1 TABLET BY MOUTH ONCE DAILY BEFORE BREAKFAST Nafziger, Tommi Rumps, NP Taking Active   magnesium oxide (MAG-OX) 400 MG tablet [id number] Yes Take 400 mg by mouth daily. [provider] Taking Active   omeprazole (PRILOSEC) 20 MG capsule [id number] Yes Take 1 capsule by mouth once daily Nafziger, Tommi Rumps, NP Taking Active   rosuvastatin (CRESTOR) 40 MG tablet [id number] Yes Take 1 tablet by mouth once daily Nafziger, Tommi Rumps, NP Taking Active   tretinoin (RETIN-A) 0.01 % gel [id number] Yes Apply topically at bedtime. Dorothyann Peng, NP Taking Active             Patient Active Problem List  Diagnosis Date Noted   Pain in right hand 04/15/2022   Trigger finger, right ring finger 03/17/2022   Trigger finger,  right middle finger 03/17/2022   Chondromalacia patellae, left knee 06/09/2021   Other spondylosis with radiculopathy, cervical region 05/11/2021   History of fusion of cervical spine 05/11/2021   Pes anserine bursitis 05/11/2021   GERD (gastroesophageal reflux disease) 09/29/2020   Esophageal stricture 01/26/2011   Arrhythmia 12/29/2010   Essential hypertension 07/21/2010   CONTACT DERMATITIS&OTHER ECZEMA DUE UNSPEC CAUSE 04/07/2010   Hyperlipidemia 09/11/2007   Migraine headache 09/11/2007   Hypothyroidism 01/26/2007    Immunization History  Administered Date(s) Administered   Fluad Quad(high Dose 65+) 05/11/2019, 05/22/2020, 05/26/2021, 04/10/2022   Influenza Whole 07/26/2005, 04/25/2009   Influenza, High Dose Seasonal PF 04/27/2016, 04/28/2017, 05/02/2018   Influenza,inj,quad, With Preservative 04/25/2017   Moderna Covid-19 Vaccine Bivalent Booster 75yr & up 05/01/2022   PFIZER(Purple Top)SARS-COV-2 Vaccination 09/01/2019, 09/26/2019, 04/21/2020, 10/22/2020   Pfizer Covid-19 Vaccine Bivalent Booster 131yr& up 04/08/2021   Pneumococcal Conjugate-13 04/27/2016   Pneumococcal Polysaccharide-23 04/28/2017   Td 07/26/2001   Tdap 09/21/2011, 05/28/2022   Zoster Recombinat (Shingrix) 04/20/2017, 08/11/2017, 09/08/2017   Zoster, Live 07/11/2012    Conditions to be addressed/monitored:  Hypertension, Hyperlipidemia, GERD, Hypothyroidism and Prevention of heart events  Conditions addressed this visit: Hypertension, hyperlipidemia  Care Plan : CCSevilleUpdates made by PrViona GilmoreRPWelcomeince 07/06/2022 12:00 AM     Problem: Problem: Hypertension, Hyperlipidemia, GERD, Hypothyroidism and Prevention of heart events      Long-Range Goal: Patient-Specific Goal   Start Date: 11/27/2020  Expected End Date: 11/27/2021  Recent Progress: On track  Priority: High  Note:   Current Barriers:  Unable to independently monitor therapeutic efficacy  Pharmacist  Clinical Goal(s):  Patient will achieve adherence to monitoring guidelines and medication adherence to achieve therapeutic efficacy through collaboration with PharmD and provider.   Interventions: 1:1 collaboration with NaDorothyann PengNP regarding development and update of comprehensive plan of care as evidenced by provider attestation and co-signature Inter-disciplinary care team collaboration (see longitudinal plan of care) Comprehensive medication review performed; medication list updated in electronic medical record  Hypertension (BP goal <140/90) -Controlled -Current treatment: Hydrochlorothiazide 25 mg 1 tablet daily - Appropriate, Effective, Safe, Accessible -Medications previously tried: none -Current home readings: 114-126/60-65 (checking once a week) -Current dietary habits: puts some salt in foods but not on the table (only to season) -Current [facility] habits: [facility], Wednesday, and Friday -Denies hypotensive/hypertensive symptoms -Educated on Exercise goal of 150 minutes per week; Importance of home blood pressure monitoring; Proper BP monitoring technique; -Counseled to monitor BP at home weekly, document, and provide log at future appointments -Counseled on diet and exercise extensively Recommended to continue current medication  Hyperlipidemia: (LDL goal < 100) -Controlled -Current treatment: Rosuvastatin 40 mg 1 tablet daily - Appropriate, Effective, Safe, Accessible -Medications previously tried: simvastatin (ineffective) -Current dietary patterns: did not discuss -Current [facility] habits: [facility], Wednesday, and Friday -Educated on Cholesterol goals;  Importance of limiting foods high in cholesterol; Exercise goal of 150 minutes per week; -Counseled on diet and exercise extensively Recommended to continue current medication Recommended repeat lipid panel  GERD (Goal: minimize  symptoms) -Controlled -Current treatment  Omeprazole 20 mg 1 capsule daily - Appropriate, Effective, Safe, Accessible Famotidine 20 mg 1 tablet at bedtime - Appropriate, Effective, Safe, Accessible -Medications previously tried: none  -  Counseled on Non-pharmacologic management of symptoms such as elevating the head of your bed, avoiding eating 2-3 hours before bed, avoiding triggering foods such as acidic, spicy, or fatty foods, eating smaller meals, and wearing clothes that are loose around the waist  Health Maintenance -Vaccine gaps: none -Current therapy:  Aspirin 81 mg 1 tablet daily Vitamin B complex w/vitamin C 1 tablet daily Calcium carbonate 600 mg 1 tablet twice daily Vitamin D3 5000 units 1 tablet daily Coenzyme Q10 30 mg 1 capsule daily Cranberry daily Glucosamine 1 tablet twice daily Hydrocortisone 2.5% ointment apply twice daily Magnesium oxide 400 mg 1 tablet daily -Educated on Cost vs benefit of each product must be carefully weighed by individual consumer -Patient is satisfied with current therapy and denies issues -Recommended to continue current medication Counseled on risk vs benefit with aspirin for primary prevention.  Patient Goals/Self-Care Activities Patient will:  - take medications as prescribed check blood pressure weekly, document, and provide at future appointments target a minimum of 150 minutes of moderate intensity exercise weekly  Follow Up Plan: The patient has been provided with contact information for the care management team and has been advised to call with any health related questions or concerns.          Medication Assistance: None required.  Patient affirms current coverage meets needs.  Compliance/Adherence/Medication fill history: Care Gaps: Urine microalbumin  BP- 130/70 05/27/22    Star-Rating Drugs: Rosuvastatin 40 mg - Last filled 04/18/22 90 DS at [facility]   Patient's preferred pharmacy is:  [facility] ([facility] [place] [facility] [place] [address] [place] ([place]) [place] [address] Phone: [phone] Fax: [phone]   Uses pill box? No - cabinet- morning on the left and nighttime on the right Pt endorses 99% compliance  We discussed: Current pharmacy is preferred with insurance plan and patient is satisfied with pharmacy services Patient decided to: Continue current medication management strategy  Care Plan and Follow Up Patient Decision:  Patient agrees to Care Plan and Follow-up.  Plan: The patient has been provided with contact information for the care management team and has been advised to call with any health related questions or concerns.   Jeni Salles, PharmD [facility] Clinical Pharmacist [place] [facility] [place]

## 2022-07-13 ENCOUNTER — Telehealth: Payer: Self-pay | Admitting: Pharmacist

## 2022-07-13 NOTE — Telephone Encounter (Signed)
Called patient to make her aware of medication change post discussion with PCP. Patient no longer needs to take aspirin 81 mg daily and removed from medication list. Patient verbalized her understanding and thanked me for the call.

## 2022-07-25 DIAGNOSIS — I10 Essential (primary) hypertension: Secondary | ICD-10-CM

## 2022-07-25 DIAGNOSIS — E782 Mixed hyperlipidemia: Secondary | ICD-10-CM

## 2022-07-27 ENCOUNTER — Other Ambulatory Visit: Payer: Self-pay | Admitting: Adult Health

## 2022-08-30 ENCOUNTER — Other Ambulatory Visit: Payer: Self-pay | Admitting: Adult Health

## 2022-09-08 ENCOUNTER — Encounter: Payer: Self-pay | Admitting: Adult Health

## 2022-09-09 ENCOUNTER — Telehealth: Payer: Self-pay | Admitting: Orthopaedic Surgery

## 2022-09-09 ENCOUNTER — Other Ambulatory Visit: Payer: Self-pay | Admitting: Adult Health

## 2022-09-09 ENCOUNTER — Encounter: Payer: Self-pay | Admitting: Adult Health

## 2022-09-09 DIAGNOSIS — M79641 Pain in right hand: Secondary | ICD-10-CM

## 2022-09-09 NOTE — Telephone Encounter (Signed)
CD made

## 2022-09-09 NOTE — Telephone Encounter (Signed)
Received call from patient. She is going for a 2nd opinion and needs records and the 05/11/2021 xrays to CD. Please let me know when ready. She is coming in to complete authorization release form. I will call her when everything is prepared for her. Thanks!

## 2022-09-10 ENCOUNTER — Telehealth: Payer: Self-pay | Admitting: Orthopaedic Surgery

## 2022-09-10 NOTE — Telephone Encounter (Signed)
IC patient,lmvm for patient to [facility] regarding records & xray CD.

## 2022-09-13 NOTE — Telephone Encounter (Signed)
Spoke with patient. Advised xray CD is ready. She stated she only needs the NCS/EMG report faxed to [facility]. I faxed [phone].

## 2022-09-14 DIAGNOSIS — M19041 Primary osteoarthritis, right hand: Secondary | ICD-10-CM | POA: Diagnosis not present

## 2022-09-14 DIAGNOSIS — G5603 Carpal tunnel syndrome, bilateral upper limbs: Secondary | ICD-10-CM | POA: Diagnosis not present

## 2022-09-23 ENCOUNTER — Encounter: Payer: Self-pay | Admitting: Adult Health

## 2022-09-24 NOTE — Telephone Encounter (Signed)
Please advise 

## 2022-09-29 ENCOUNTER — Ambulatory Visit (INDEPENDENT_AMBULATORY_CARE_PROVIDER_SITE_OTHER): Payer: PPO | Admitting: Adult Health

## 2022-09-29 ENCOUNTER — Encounter: Payer: Self-pay | Admitting: Adult Health

## 2022-09-29 VITALS — BP 138/70 | HR 70 | Temp 98.3°F | Ht 61.0 in | Wt 142.0 lb

## 2022-09-29 DIAGNOSIS — R519 Headache, unspecified: Secondary | ICD-10-CM

## 2022-09-29 MED ORDER — PREDNISONE 20 MG PO TABS: 20.0000 mg | ORAL_TABLET | Freq: Every day | ORAL | 0 refills | Status: DC

## 2022-09-29 NOTE — Progress Notes (Signed)
Subjective:    Patient ID: Patricia Solis, female    DOB: 1950-12-12, 72 y.o.   MRN: [id number]  Headache     72 year old female who  has a past medical history of Basal cell carcinoma (2007), Breast cancer ([place]), Carotid bruit (08/2008), DUB (dysfunctional uterine bleeding), Esophagitis, HTN (hypertension), Hyperlipidemia, Hypothyroidism, MHA (microangiopathic hemolytic anemia) ([place]), Personal history of chemotherapy, Personal history of radiation therapy, and Spinal stenosis.  She presents to the office today for an acute issue. She reports that over the last month she has experienced intermittent sharp pain behind her eyes. First started behind the left eye and would last a few minutes and then resolve. Did not happen every day and then went away.   This morning when she woke up she had a sharp pain behind her right eye. Pain would radiate into the temple areas.    When she would have a headache in the past she would take [facility] powder, she tried it with this pain and the pain did not improve.   She does have a history of migraines but this is felt as a different pain then she gets with migraines.   Does not feel like pain happens at the same time every day. No pain with combing her hair..      Review of Systems  Neurological:  Positive for headaches.   See HPI   Past Medical History:  Diagnosis Date   Basal cell carcinoma 2007   [place]   Breast cancer ([place])    Invasive lobular carcinoma RIGHT breast   Carotid bruit 08/2008   Normal Doppler   DUB (dysfunctional uterine bleeding)    Esophagitis    HTN (hypertension)    Tx started 06/2010   Hyperlipidemia    Hypothyroidism    MHA (microangiopathic hemolytic anemia) (H[facility])    Personal history of chemotherapy    2003   Personal history of radiation therapy    2003 right breast   Spinal stenosis     Social History   Socioeconomic History   Marital status: Married    Spouse name: Not on file   Number of children: Not on  file   Years of education: Not on file   Highest education level: Not on file  Occupational History   Not on file  Tobacco Use   Smoking status: Never   Smokeless tobacco: Never  Vaping Use   Vaping Use: Never used  Substance and Sexual Activity   Alcohol use: No   Drug use: No   Sexual activity: Not on file  Other Topics Concern   Not on file  Social History Narrative   Retired in March    Married for 77 years    One daughter, lives in [place]    Has four grandchildren      Likes to play with grand kids, go to [employer] and mountains.       Social Determinants of Health   Financial Resource Strain: Low Risk  (07/06/2022)   Overall Financial Resource Strain (CARDIA)    Difficulty of Paying Living Expenses: Not very hard  Food Insecurity: No Food Insecurity (04/12/2022)   Hunger Vital Sign    Worried About Running Out of Food in the Last Year: Never true    Ran Out of Food in the Last Year: Never true  Transportation Needs: No Transportation Needs (04/12/2022)   PRAPARE - Hydrologist (Medical): No    Lack  of Transportation (Non-Medical): No  Physical Activity: Sufficiently Active (04/12/2022)   Exercise Vital Sign    Days of Exercise per Week: 3 days    Minutes of Exercise per Session: 120 min  Stress: No Stress Concern Present (04/12/2022)   [place]    Feeling of Stress : Not at all  Social Connections: Moderately Integrated (04/08/2021)   Social Connection and Isolation Panel [NHANES]    Frequency of Communication with Friends and Family: Twice a week    Frequency of Social Gatherings with Friends and Family: More than three times a week    Attends Religious Services: 1 to 4 times per year    Active Member of [employer] or Organizations: No    Attends Archivist Meetings: Never    Marital Status: Married  Human resources officer Violence: Not At Risk (04/08/2021)   Humiliation,  Afraid, Rape, and Kick questionnaire    Fear of Current or Ex-Partner: No    Emotionally Abused: No    Physically Abused: No    Sexually Abused: No    Past Surgical History:  Procedure Laterality Date   BREAST BIOPSY Right    2007 benign   BREAST LUMPECTOMY Right    2003   BTL     CATARACT EXTRACTION, BILATERAL Bilateral 03/26/2016   still blurred vision; Md states this is fine   CERVICAL LAMINECTOMY     Left   FOOT SURGERY     SHOULDER ARTHROSCOPY Right    TUBAL LIGATION  1981    Family History  Problem Relation Age of Onset   Heart attack Father    Coronary artery disease Father    Diabetes Father    Coronary artery disease Mother    Leukemia Mother    Thyroid disease Mother    Multiple sclerosis Sister        Twin   Breast cancer Maternal Aunt     Allergies  Allergen Reactions   Ambien Cr [Zolpidem] Other (See Comments)    Hallucinations   Cozaar [Losartan] Cough   Erythromycin     REACTION: rash   Hydrocodone Other (See Comments)    Hallucinations   Lisinopril Cough   Methyldibromoglutaronitrile Dermatitis   Penicillins     REACTION: rash   Bromine Rash    Per dermatologist    Clindamycin/Lincomycin Rash    Current Outpatient Medications on File Prior to Visit  Medication Sig Dispense Refill   B Complex-C (B-COMPLEX WITH VITAMIN C) tablet Take 1 tablet by mouth daily.     calcium carbonate (OS-CAL) 600 MG TABS tablet Take 1 tablet (600 mg total) by mouth 2 (two) times daily with a meal. 180 tablet 3   Cholecalciferol (VITAMIN D3 PO) Take by mouth.     Coenzyme Q10 200 MG TABS Take 200 mg by mouth daily.     CRANBERRY PO Take 2 tablets by mouth at bedtime.     famotidine (PEPCID) 40 MG tablet TAKE 1 TABLET BY MOUTH AT BEDTIME 90 tablet 0   GLUCOSAMINE PO Take 1 tablet by mouth 2 (two) times daily.     hydrochlorothiazide (MICROZIDE) 12.5 MG capsule Take 1 capsule by mouth once daily 90 capsule 0   hydrocortisone 2.5 % ointment Apply topically 2 (two)  times daily. 30 g 2   levothyroxine (SYNTHROID) 75 MCG tablet TAKE 1 TABLET BY MOUTH ONCE DAILY BEFORE BREAKFAST 90 tablet 1   magnesium oxide (MAG-OX) 400 MG tablet Take 400  mg by mouth daily.     omeprazole (PRILOSEC) 20 MG capsule Take 1 capsule by mouth once daily 90 capsule 0   rosuvastatin (CRESTOR) 40 MG tablet Take 1 tablet by mouth once daily 90 tablet 0   tretinoin (RETIN-A) 0.01 % gel Apply topically at bedtime. 45 g 0   No current facility-administered medications on file prior to visit.    BP 138/70   Pulse 70   Temp 98.3 F (36.8 C) (Oral)   Ht '5\' 1"'$  (1.549 m)   Wt 142 lb (64.4 kg)   SpO2 95%   BMI 26.83 kg/m       Objective:   Physical Exam Vitals and nursing note reviewed.  Constitutional:      Appearance: Normal appearance. She is well-developed.  HENT:     Right Ear: Hearing, tympanic membrane, ear canal and external ear normal.     Left Ear: Hearing, tympanic membrane, ear canal and external ear normal.     Nose: No congestion or rhinorrhea.     Right Turbinates: Not enlarged.     Left Turbinates: Not enlarged.     Right Sinus: No maxillary sinus tenderness or frontal sinus tenderness.     Left Sinus: No maxillary sinus tenderness or frontal sinus tenderness.  Eyes:     Extraocular Movements: Extraocular movements intact.     Right eye: Normal extraocular motion and no nystagmus.     Left eye: Normal extraocular motion and no nystagmus.     Conjunctiva/sclera: Conjunctivae normal.  Abdominal:     General: Abdomen is flat.     Palpations: Abdomen is soft.  Musculoskeletal:        General: Normal range of motion.  Skin:    General: Skin is warm and dry.  Neurological:     General: No focal deficit present.     Mental Status: She is alert and oriented to person, place, and time.  Psychiatric:        Mood and Affect: Mood normal.        Behavior: Behavior normal.        Thought Content: Thought content normal.        Judgment: Judgment normal.        Assessment & Plan:   1. Acute nonintractable headache, unspecified headache type - ? Cluster headache. Tried using high flow oxygen with ? Improvement. Do not think she has temporal arteritis  - Due to new onset, will order MRI and trial her on 5 days or prednisone  - predniSONE (DELTASONE) 20 MG tablet; Take 1 tablet (20 mg total) by mouth daily with breakfast.  Dispense: 5 tablet; Refill: 0 - MR Brain Wo Contrast; Future  Dorothyann Peng, NP  Dorothyann Peng, NP

## 2022-10-01 ENCOUNTER — Encounter: Payer: Self-pay | Admitting: Adult Health

## 2022-10-05 ENCOUNTER — Encounter: Payer: Self-pay | Admitting: Adult Health

## 2022-10-05 ENCOUNTER — Other Ambulatory Visit: Payer: Self-pay | Admitting: Orthopedic Surgery

## 2022-10-05 DIAGNOSIS — G5603 Carpal tunnel syndrome, bilateral upper limbs: Secondary | ICD-10-CM | POA: Diagnosis not present

## 2022-10-05 DIAGNOSIS — R9089 Other abnormal findings on diagnostic imaging of central nervous system: Secondary | ICD-10-CM

## 2022-10-14 ENCOUNTER — Ambulatory Visit (HOSPITAL_BASED_OUTPATIENT_CLINIC_OR_DEPARTMENT_OTHER)
Admission: RE | Admit: 2022-10-14 | Discharge: 2022-10-14 | Disposition: A | Discharge status: Home / Self Care | Payer: PPO | Source: Ambulatory Visit | Attending: Adult Health | Admitting: Adult Health

## 2022-10-14 DIAGNOSIS — I6782 Cerebral ischemia: Secondary | ICD-10-CM | POA: Insufficient documentation

## 2022-10-14 DIAGNOSIS — R519 Headache, unspecified: Secondary | ICD-10-CM | POA: Diagnosis not present

## 2022-10-15 NOTE — Telephone Encounter (Signed)
Updated patient on MRI and all questions answered. She continues to have headaches but no worse or no better. MRI showed   IMPRESSION: 1. Punctate focus of diffusion-weighted signal abnormality within the left parietal lobe cortex. This may reflect a tiny acute/early subacute infarct. However, a small cortical metastasis cannot be excluded. A short-interval follow-up brain MRI (without and with contrast) is recommended in 6-8 weeks (or sooner should the patient's symptoms worsen). 2. Mild chronic small vessel ischemic changes within the cerebral white matter.  Will have her come for blood work in 3 weeks and schedule MRI for 6 weeks

## 2022-10-20 ENCOUNTER — Other Ambulatory Visit: Payer: Self-pay | Admitting: Adult Health

## 2022-10-21 ENCOUNTER — Other Ambulatory Visit: Payer: Self-pay | Admitting: Adult Health

## 2022-10-21 DIAGNOSIS — Z760 Encounter for issue of repeat prescription: Secondary | ICD-10-CM

## 2022-10-21 DIAGNOSIS — E039 Hypothyroidism, unspecified: Secondary | ICD-10-CM

## 2022-11-02 ENCOUNTER — Ambulatory Visit (INDEPENDENT_AMBULATORY_CARE_PROVIDER_SITE_OTHER): Payer: PPO | Admitting: Family

## 2022-11-02 ENCOUNTER — Ambulatory Visit (INDEPENDENT_AMBULATORY_CARE_PROVIDER_SITE_OTHER): Payer: PPO

## 2022-11-02 ENCOUNTER — Other Ambulatory Visit (INDEPENDENT_AMBULATORY_CARE_PROVIDER_SITE_OTHER): Payer: PPO

## 2022-11-02 ENCOUNTER — Encounter: Payer: Self-pay | Admitting: Family

## 2022-11-02 VITALS — BP 142/64 | HR 75 | Temp 98.4°F | Ht 61.0 in | Wt 138.0 lb

## 2022-11-02 DIAGNOSIS — M25551 Pain in right hip: Secondary | ICD-10-CM | POA: Diagnosis not present

## 2022-11-02 DIAGNOSIS — R9089 Other abnormal findings on diagnostic imaging of central nervous system: Secondary | ICD-10-CM | POA: Diagnosis not present

## 2022-11-02 DIAGNOSIS — M5417 Radiculopathy, lumbosacral region: Secondary | ICD-10-CM | POA: Diagnosis not present

## 2022-11-02 DIAGNOSIS — M545 Low back pain, unspecified: Secondary | ICD-10-CM | POA: Diagnosis not present

## 2022-11-02 DIAGNOSIS — M47816 Spondylosis without myelopathy or radiculopathy, lumbar region: Secondary | ICD-10-CM | POA: Diagnosis not present

## 2022-11-02 LAB — BASIC METABOLIC PANEL
BUN: 21 mg/dL (ref 6–23)
CO2: 30 mEq/L (ref 19–32)
Calcium: 9.6 mg/dL (ref 8.4–10.5)
Chloride: 97 mEq/L (ref 96–112)
Creatinine, Ser: 0.78 mg/dL (ref 0.40–1.20)
GFR: 76.27 mL/min (ref >60.00)
Glucose, Bld: 88 mg/dL (ref 70–99)
Potassium: 3.3 mEq/L — ABNORMAL LOW (ref 3.5–5.1)
Sodium: 138 mEq/L (ref 135–145)

## 2022-11-02 MED ORDER — METHYLPREDNISOLONE 4 MG PO TBPK: ORAL_TABLET | ORAL | 0 refills | Status: DC

## 2022-11-02 MED ORDER — TRAMADOL HCL 50 MG PO TABS: 50.0000 mg | ORAL_TABLET | Freq: Three times a day (TID) | ORAL | 0 refills | Status: AC | PRN

## 2022-11-02 NOTE — Progress Notes (Signed)
Acute Office Visit  Subjective:     Patient ID: Patricia Solis, female    DOB: [birth year], 72 y.o.   MRN: [id number]  Chief Complaint  Patient presents with  . Hip Pain    Patient complains of hip pain, x3 days, Tried Advil with no relief     HPI Patient is a 72 year old female, patient of Dr. Caryl Never presenting today with complaints of right hip pain that radiates down to her right foot that has been ongoing x 1 week.  Reports it started after she was cleaning her house.  The pain is a 10 out of 10 and described as sharp.  She has been taken over-the-counter Advil without much relief.  Reports that laying down makes the pain better.  Denies any injury to her hip or lower back.  Has a history of cervical radiculopathy in the past.  Patient has a colonoscopy scheduled for Thursday at 7 AM.  Review of Systems  Musculoskeletal:  Positive for back pain.       Right hip pain that radiates to the foot  Neurological:  Positive for weakness.       Weakness in her right leg  All other systems reviewed and are negative. Past Medical History:  Diagnosis Date  . Basal cell carcinoma 2007   [facility]  . Breast cancer    Invasive lobular carcinoma RIGHT breast  . Carotid bruit 08/2008   Normal Doppler  . DUB (dysfunctional uterine bleeding)   . Esophagitis   . HTN (hypertension)    Tx started 06/2010  . Hyperlipidemia   . Hypothyroidism   . MHA (microangiopathic hemolytic anemia)   . Personal history of chemotherapy    2003  . Personal history of radiation therapy    2003 right breast  . Spinal stenosis     Social History   Socioeconomic History  . Marital status: Married    Spouse name: Not on file  . Number of children: Not on file  . Years of education: Not on file  . Highest education level: Not on file  Occupational History  . Not on file  Tobacco Use  . Smoking status: Never  . Smokeless tobacco: Never  Vaping Use  . Vaping Use: Never used  Substance and Sexual  Activity  . Alcohol use: No  . Drug use: No  . Sexual activity: Not on file  Other Topics Concern  . Not on file  Social History Narrative   Retired in March    Married for 38 years    One daughter, lives in [place]    Has four grandchildren      Likes to play with grand kids, go to [employer] and mountains.       Social Determinants of Health   Financial Resource Strain: Low Risk  (07/06/2022)   Overall Financial Resource Strain (CARDIA)   . Difficulty of Paying Living Expenses: Not very hard  Food Insecurity: No Food Insecurity (04/12/2022)   Hunger Vital Sign   . Worried About Programme researcher, broadcasting/film/video in the Last Year: Never true   . Ran Out of Food in the Last Year: Never true  Transportation Needs: No Transportation Needs (04/12/2022)   PRAPARE - Transportation   . Lack of Transportation (Medical): No   . Lack of Transportation (Non-Medical): No  Physical Activity: Sufficiently Active (04/12/2022)   Exercise Vital Sign   . Days of Exercise per Week: 3 days   . Minutes of Exercise per  Session: 120 min  Stress: No Stress Concern Present (04/12/2022)   [employer] of Occupational Health - Occupational Stress Questionnaire   . Feeling of Stress : Not at all  Social Connections: Moderately Integrated (04/08/2021)   Social Connection and Isolation Panel [NHANES]   . Frequency of Communication with Friends and Family: Twice a week   . Frequency of Social Gatherings with Friends and Family: More than three times a week   . Attends Religious Services: 1 to 4 times per year   . Active Member of Clubs or Organizations: No   . Attends Banker Meetings: Never   . Marital Status: Married  Catering manager Violence: Not At Risk (04/08/2021)   Humiliation, Afraid, Rape, and Kick questionnaire   . Fear of Current or Ex-Partner: No   . Emotionally Abused: No   . Physically Abused: No   . Sexually Abused: No    Past Surgical History:  Procedure Laterality Date  . BREAST  BIOPSY Right    2007 benign  . BREAST LUMPECTOMY Right    2003  . BTL    . CATARACT EXTRACTION, BILATERAL Bilateral 03/26/2016   still blurred vision; Md states this is fine  . CERVICAL LAMINECTOMY     Left  . FOOT SURGERY    . SHOULDER ARTHROSCOPY Right   . TUBAL LIGATION  1981    Family History  Problem Relation Age of Onset  . Heart attack Father   . Coronary artery disease Father   . Diabetes Father   . Coronary artery disease Mother   . Leukemia Mother   . Thyroid disease Mother   . Multiple sclerosis Sister        Twin  . Breast cancer Maternal Aunt     Allergies  Allergen Reactions  . Ambien Cr [Zolpidem] Other (See Comments)    Hallucinations  . Cozaar [Losartan] Cough  . Erythromycin     REACTION: rash  . Hydrocodone Other (See Comments)    Hallucinations  . Lisinopril Cough  . Methyldibromoglutaronitrile Dermatitis  . Penicillins     REACTION: rash  . Bromine Rash    Per dermatologist   . Clindamycin/Lincomycin Rash    Current Outpatient Medications on File Prior to Visit  Medication Sig Dispense Refill  . B Complex-C (B-COMPLEX WITH VITAMIN C) tablet Take 1 tablet by mouth daily.    . calcium carbonate (OS-CAL) 600 MG TABS tablet Take 1 tablet (600 mg total) by mouth 2 (two) times daily with a meal. 180 tablet 3  . Cholecalciferol (VITAMIN D3 PO) Take by mouth.    . Coenzyme Q10 200 MG TABS Take 200 mg by mouth daily.    Marland Kitchen CRANBERRY PO Take 2 tablets by mouth at bedtime.    . famotidine (PEPCID) 40 MG tablet TAKE 1 TABLET BY MOUTH AT BEDTIME 90 tablet 0  . GLUCOSAMINE PO Take 1 tablet by mouth 2 (two) times daily.    . hydrochlorothiazide (MICROZIDE) 12.5 MG capsule Take 1 capsule by mouth once daily 90 capsule 0  . hydrocortisone 2.5 % ointment Apply topically 2 (two) times daily. 30 g 2  . levothyroxine (SYNTHROID) 75 MCG tablet TAKE 1 TABLET BY MOUTH ONCE DAILY BEFORE BREAKFAST 90 tablet 0  . magnesium oxide (MAG-OX) 400 MG tablet Take 400 mg by  mouth daily.    Marland Kitchen omeprazole (PRILOSEC) 20 MG capsule Take 1 capsule by mouth once daily 90 capsule 0  . rosuvastatin (CRESTOR) 40 MG tablet Take 1  tablet by mouth once daily 90 tablet 0  . tretinoin (RETIN-A) 0.01 % gel Apply topically at bedtime. 45 g 0   No current facility-administered medications on file prior to visit.    BP (!) 142/64   Pulse 75   Temp 98.4 F (36.9 C) (Oral)   Ht 5\' 1"  (1.549 m)   Wt 138 lb (62.6 kg)   SpO2 96%   BMI 26.07 kg/m chart      Objective:    BP (!) 142/64   Pulse 75   Temp 98.4 F (36.9 C) (Oral)   Ht 5\' 1"  (1.549 m)   Wt 138 lb (62.6 kg)   SpO2 96%   BMI 26.07 kg/m    Physical Exam Vitals reviewed.  Constitutional:      Appearance: Normal appearance.  Cardiovascular:     Rate and Rhythm: Normal rate and regular rhythm.     Pulses: Normal pulses.     Heart sounds: Normal heart sounds.  Pulmonary:     Effort: Pulmonary effort is normal.     Breath sounds: Normal breath sounds.  Musculoskeletal:        General: Normal range of motion.     Cervical back: Normal range of motion and neck supple.     Comments: Negative straight leg raise, full range of motion of the pain unchanged.  No tenderness to palpation of the right hip.  Full range of motion without difficulty.  Skin:    General: Skin is warm and dry.  Neurological:     General: No focal deficit present.     Mental Status: She is alert and oriented to person, place, and time. Mental status is at baseline.  Psychiatric:        Mood and Affect: Mood normal.        Behavior: Behavior normal.   No results found for any visits on 11/02/22.      Assessment & Plan:   Problem List Items Addressed This Visit   None Visit Diagnoses     Lumbosacral radiculopathy    -  Primary   Relevant Orders   DG Lumbar Spine Complete   Right hip pain       Relevant Orders   DG Hip Unilat W OR W/O Pelvis 2-3 Views Right       Meds ordered this encounter  Medications  .  methylPREDNISolone (MEDROL DOSEPAK) 4 MG TBPK tablet    Sig: As  directed    Dispense:  21 tablet    Refill:  0  . traMADol (ULTRAM) 50 MG tablet    Sig: Take 1 tablet (50 mg total) by mouth every 8 (eight) hours as needed for up to 5 days.    Dispense:  15 tablet    Refill:  0    No follow-ups on file.  Eulis FosterPadonda B Tereso Unangst, FNP

## 2022-11-03 ENCOUNTER — Encounter: Payer: Self-pay | Admitting: Adult Health

## 2022-11-04 DIAGNOSIS — K5730 Diverticulosis of large intestine without perforation or abscess without bleeding: Secondary | ICD-10-CM | POA: Diagnosis not present

## 2022-11-04 DIAGNOSIS — Z1211 Encounter for screening for malignant neoplasm of colon: Secondary | ICD-10-CM | POA: Diagnosis not present

## 2022-11-04 DIAGNOSIS — K644 Residual hemorrhoidal skin tags: Secondary | ICD-10-CM | POA: Diagnosis not present

## 2022-11-04 DIAGNOSIS — K648 Other hemorrhoids: Secondary | ICD-10-CM | POA: Diagnosis not present

## 2022-11-04 LAB — HM COLONOSCOPY

## 2022-11-05 ENCOUNTER — Telehealth: Payer: Self-pay | Admitting: Adult Health

## 2022-11-05 NOTE — Telephone Encounter (Signed)
Pt called, returning CMA's call. CMA was with a patient. Pt asked that CMA call back at his earliest convenience. 

## 2022-11-05 NOTE — Telephone Encounter (Signed)
Please see result note 

## 2022-11-09 DIAGNOSIS — H524 Presbyopia: Secondary | ICD-10-CM | POA: Diagnosis not present

## 2022-11-09 DIAGNOSIS — Z961 Presence of intraocular lens: Secondary | ICD-10-CM | POA: Diagnosis not present

## 2022-11-09 DIAGNOSIS — H5213 Myopia, bilateral: Secondary | ICD-10-CM | POA: Diagnosis not present

## 2022-11-16 ENCOUNTER — Encounter (HOSPITAL_BASED_OUTPATIENT_CLINIC_OR_DEPARTMENT_OTHER): Payer: Self-pay

## 2022-11-16 ENCOUNTER — Ambulatory Visit (HOSPITAL_BASED_OUTPATIENT_CLINIC_OR_DEPARTMENT_OTHER): Admit: 2022-11-16 | Payer: PPO | Admitting: Orthopedic Surgery

## 2022-11-16 SURGERY — CARPAL TUNNEL RELEASE
Anesthesia: Choice | Laterality: Right

## 2022-11-22 ENCOUNTER — Other Ambulatory Visit: Payer: Self-pay | Admitting: Adult Health

## 2022-12-02 ENCOUNTER — Ambulatory Visit (HOSPITAL_COMMUNITY)
Admission: RE | Admit: 2022-12-02 | Discharge: 2022-12-02 | Disposition: A | Discharge status: Home / Self Care | Payer: PPO | Source: Ambulatory Visit | Attending: Adult Health | Admitting: Adult Health

## 2022-12-02 DIAGNOSIS — I639 Cerebral infarction, unspecified: Secondary | ICD-10-CM | POA: Diagnosis not present

## 2022-12-02 DIAGNOSIS — R9089 Other abnormal findings on diagnostic imaging of central nervous system: Secondary | ICD-10-CM | POA: Diagnosis not present

## 2022-12-02 MED ORDER — GADOBUTROL 1 MMOL/ML IV SOLN
6.0000 mL | Freq: Once | INTRAVENOUS | Status: AC | PRN
  Administered 2022-12-02: 6 mL via INTRAVENOUS

## 2022-12-03 ENCOUNTER — Encounter: Payer: Self-pay | Admitting: Adult Health

## 2022-12-03 NOTE — Telephone Encounter (Signed)
Please advise 

## 2022-12-09 NOTE — Telephone Encounter (Signed)
Updated patient on MRI of head

## 2022-12-10 ENCOUNTER — Encounter: Payer: Self-pay | Admitting: Adult Health

## 2022-12-10 ENCOUNTER — Ambulatory Visit (INDEPENDENT_AMBULATORY_CARE_PROVIDER_SITE_OTHER): Payer: PPO | Admitting: Adult Health

## 2022-12-10 VITALS — BP 118/60 | HR 73 | Temp 98.1°F | Ht 61.0 in | Wt 142.0 lb

## 2022-12-10 DIAGNOSIS — L309 Dermatitis, unspecified: Secondary | ICD-10-CM | POA: Diagnosis not present

## 2022-12-10 MED ORDER — TRIAMCINOLONE ACETONIDE 0.5 % EX CREA
1.0000 | TOPICAL_CREAM | Freq: Two times a day (BID) | CUTANEOUS | 2 refills | Status: DC
Start: 2022-12-10 — End: 2024-05-31

## 2022-12-10 NOTE — Progress Notes (Signed)
Subjective:    Patient ID: Patricia Solis, female    DOB: 02/14/1951, 72 y.o.   MRN: [id number]  HPI  72 year old female who  has a past medical history of Basal cell carcinoma (2007), Breast cancer ([facility]), Carotid bruit (08/2008), DUB (dysfunctional uterine bleeding), Esophagitis, HTN (hypertension), Hyperlipidemia, Hypothyroidism, MHA (microangiopathic hemolytic anemia) ([facility]), Personal history of chemotherapy, Personal history of radiation therapy, and Spinal stenosis.  Over the last 6 to 8 weeks she has noticed red itchy area on her right leg.  She has not had any pain.  Has not noticed any vesicles or drainage. She has not been using any OTC creams.    Review of Systems See HPI   Past Medical History:  Diagnosis Date   Basal cell carcinoma 2007   [facility]   Breast cancer ([facility])    Invasive lobular carcinoma RIGHT breast   Carotid bruit 08/2008   Normal Doppler   DUB (dysfunctional uterine bleeding)    Esophagitis    HTN (hypertension)    Tx started 06/2010   Hyperlipidemia    Hypothyroidism    MHA (microangiopathic hemolytic anemia) ([facility])    Personal history of chemotherapy    2003   Personal history of radiation therapy    2003 right breast   Spinal stenosis     Social History   Socioeconomic History   Marital status: Married    Spouse name: Not on file   Number of children: Not on file   Years of education: Not on file   Highest education level: Not on file  Occupational History   Not on file  Tobacco Use   Smoking status: Never   Smokeless tobacco: Never  Vaping Use   Vaping Use: Never used  Substance and Sexual Activity   Alcohol use: No   Drug use: No   Sexual activity: Not on file  Other Topics Concern   Not on file  Social History Narrative   Retired in March    Married for 38 years    One daughter, lives in [place]    Has four grandchildren      Likes to play with grand kids, go to [employer] and mountains.       Social Determinants of Health    Financial Resource Strain: Low Risk  (07/06/2022)   Overall Financial Resource Strain (CARDIA)    Difficulty of Paying Living Expenses: Not very hard  Food Insecurity: No Food Insecurity (04/12/2022)   Hunger Vital Sign    Worried About Running Out of Food in the Last Year: Never true    Ran Out of Food in the Last Year: Never true  Transportation Needs: No Transportation Needs (04/12/2022)   PRAPARE - Administrator, Civil Service (Medical): No    Lack of Transportation (Non-Medical): No  Physical Activity: Sufficiently Active (04/12/2022)   Exercise Vital Sign    Days of Exercise per Week: 3 days    Minutes of Exercise per Session: 120 min  Stress: No Stress Concern Present (04/12/2022)   [employer] of Occupational Health - Occupational Stress Questionnaire    Feeling of Stress : Not at all  Social Connections: Moderately Integrated (04/08/2021)   Social Connection and Isolation Panel [NHANES]    Frequency of Communication with Friends and Family: Twice a week    Frequency of Social Gatherings with Friends and Family: More than three times a week    Attends Religious Services: 1 to 4 times per  year    Active Member of Clubs or Organizations: No    Attends Banker Meetings: Never    Marital Status: Married  Catering manager Violence: Not At Risk (04/08/2021)   Humiliation, Afraid, Rape, and Kick questionnaire    Fear of Current or Ex-Partner: No    Emotionally Abused: No    Physically Abused: No    Sexually Abused: No    Past Surgical History:  Procedure Laterality Date   BREAST BIOPSY Right    2007 benign   BREAST LUMPECTOMY Right    2003   BTL     CATARACT EXTRACTION, BILATERAL Bilateral 03/26/2016   still blurred vision; Md states this is fine   CERVICAL LAMINECTOMY     Left   FOOT SURGERY     SHOULDER ARTHROSCOPY Right    TUBAL LIGATION  1981    Family History  Problem Relation Age of Onset   Heart attack Father    Coronary artery  disease Father    Diabetes Father    Coronary artery disease Mother    Leukemia Mother    Thyroid disease Mother    Multiple sclerosis Sister        Twin   Breast cancer Maternal Aunt     Allergies  Allergen Reactions   Ambien Cr [Zolpidem] Other (See Comments)    Hallucinations   Cozaar [Losartan] Cough   Erythromycin     REACTION: rash   Hydrocodone Other (See Comments)    Hallucinations   Lisinopril Cough   Methyldibromoglutaronitrile Dermatitis   Penicillins     REACTION: rash   Bromine Rash    Per dermatologist    Clindamycin/Lincomycin Rash    Current Outpatient Medications on File Prior to Visit  Medication Sig Dispense Refill   B Complex-C (B-COMPLEX WITH VITAMIN C) tablet Take 1 tablet by mouth daily.     calcium carbonate (OS-CAL) 600 MG TABS tablet Take 1 tablet (600 mg total) by mouth 2 (two) times daily with a meal. 180 tablet 3   Cholecalciferol (VITAMIN D3 PO) Take by mouth.     Coenzyme Q10 200 MG TABS Take 200 mg by mouth daily.     CRANBERRY PO Take 2 tablets by mouth at bedtime.     famotidine (PEPCID) 40 MG tablet TAKE 1 TABLET BY MOUTH AT BEDTIME 90 tablet 0   GLUCOSAMINE PO Take 1 tablet by mouth 2 (two) times daily.     hydrochlorothiazide (MICROZIDE) 12.5 MG capsule Take 1 capsule by mouth once daily 90 capsule 0   hydrocortisone 2.5 % ointment Apply topically 2 (two) times daily. 30 g 2   levothyroxine (SYNTHROID) 75 MCG tablet TAKE 1 TABLET BY MOUTH ONCE DAILY BEFORE BREAKFAST 90 tablet 0   magnesium oxide (MAG-OX) 400 MG tablet Take 400 mg by mouth daily.     methylPREDNISolone (MEDROL DOSEPAK) 4 MG TBPK tablet As  directed 21 tablet 0   omeprazole (PRILOSEC) 20 MG capsule Take 1 capsule by mouth once daily 90 capsule 0   rosuvastatin (CRESTOR) 40 MG tablet Take 1 tablet by mouth once daily 90 tablet 0   tretinoin (RETIN-A) 0.01 % gel Apply topically at bedtime. 45 g 0   No current facility-administered medications on file prior to visit.     BP 118/60   Pulse 73   Temp 98.1 F (36.7 C) (Oral)   Ht 5\' 1"  (1.549 m)   Wt 142 lb (64.4 kg)   SpO2 94%   BMI  26.83 kg/m       Objective:   Physical Exam Vitals and nursing note reviewed.  Constitutional:      Appearance: Normal appearance.  Skin:    General: Skin is warm and dry.     Findings: Rash present.     Comments: Lightly raised semicircular area with flaking skin and erythema noted on right shin.  Neurological:     Mental Status: She is alert.  Psychiatric:        Mood and Affect: Mood normal.        Behavior: Behavior normal.        Thought Content: Thought content normal.           Assessment & Plan:  1. Eczema, unspecified type - Will trial her on kenalog cream  - Return instructions reviewed - triamcinolone cream (KENALOG) 0.5 %; Apply 1 Application topically 2 (two) times daily.  Dispense: 30 g; Refill: 2  Shirline Frees, NP

## 2022-12-13 ENCOUNTER — Other Ambulatory Visit: Payer: Self-pay | Admitting: Adult Health

## 2023-01-13 ENCOUNTER — Other Ambulatory Visit: Payer: Self-pay | Admitting: Adult Health

## 2023-01-18 ENCOUNTER — Other Ambulatory Visit: Payer: Self-pay | Admitting: Adult Health

## 2023-01-18 DIAGNOSIS — Z760 Encounter for issue of repeat prescription: Secondary | ICD-10-CM

## 2023-01-18 DIAGNOSIS — E039 Hypothyroidism, unspecified: Secondary | ICD-10-CM

## 2023-02-19 ENCOUNTER — Other Ambulatory Visit: Payer: Self-pay | Admitting: Adult Health

## 2023-02-25 ENCOUNTER — Other Ambulatory Visit: Payer: Self-pay | Admitting: Obstetrics and Gynecology

## 2023-02-25 DIAGNOSIS — Z1231 Encounter for screening mammogram for malignant neoplasm of breast: Secondary | ICD-10-CM

## 2023-02-26 ENCOUNTER — Other Ambulatory Visit: Payer: Self-pay | Admitting: Adult Health

## 2023-03-03 ENCOUNTER — Encounter: Payer: Self-pay | Admitting: Family Medicine

## 2023-03-03 ENCOUNTER — Telehealth (INDEPENDENT_AMBULATORY_CARE_PROVIDER_SITE_OTHER): Payer: PPO | Admitting: Family Medicine

## 2023-03-03 VITALS — BP 114/64 | HR 77 | Temp 97.7°F | Ht 61.0 in | Wt 142.0 lb

## 2023-03-03 DIAGNOSIS — Z0000 Encounter for general adult medical examination without abnormal findings: Secondary | ICD-10-CM

## 2023-03-03 NOTE — Progress Notes (Signed)
PATIENT CHECK-IN and HEALTH RISK ASSESSMENT QUESTIONNAIRE:  -completed by phone/video for upcoming Medicare Preventive Visit  Pre-Visit Check-in: 1)Vitals (height, wt, BP, etc) - record in vitals section for visit on day of visit Request home vitals (wt, BP, etc.) and enter into vitals, THEN update Vital Signs SmartPhrase below at the top of the HPI. See below.  2)Review and Update Medications, Allergies PMH, Surgeries, Social history in Epic 3)Hospitalizations in the last year with date/reason? no  4)Review and Update Care Team (patient's specialists) in Epic 5) Complete PHQ9 in Epic  6) Complete Fall Screening in Epic 7)Review all Health Maintenance Due and order under PCP if not done.  8)Medicare Wellness Questionnaire: Answer theses question about your habits: Do you drink alcohol? No  Do you exercises?  [facility] walks for 20 minutes then 1 hour class (weights, bands, exercise balls, balance, stretches) Are you sexually active? No  Typical breakfast: cup of yogurt, granola bar, sometimes scramble egg n toast Typical lunch: [place] sandwich. Raw vegetable Typical dinner: sometimes will go out for dinner. 2 meat of some kind, 2 vegetables, spahetti Typical snacks:raw vegetable: celery, cucumber, bell peppers  Beverages: water, coffee occasionally   Answer theses question about you: Can you perform most household chores?yes Do you find it hard to follow a conversation in a noisy room?no Do you often ask people to speak up or repeat themselves?no Do you feel that you have a problem with memory? no Do you balance your checkbook and or bank acounts?yes Do you feel safe at home?yes Last dentist visit? It was this month. Unable to recall. Go to dentist twice  a year.  Do you need assistance with any of the following: NO Please note if so   Driving?  Feeding yourself?  Getting from bed to chair?  Getting to the toilet?  Bathing or showering?  Dressing  yourself?  Managing money?  Climbing a flight of stairs  Preparing meals?  Do you have Advanced Directives in place (Living Will, Healthcare Power or Attorney)? Yes   Last eye Exam and location? Was March/April this year with [facility] in [place]   Do you currently use prescribed or non-prescribed narcotic or opioid pain medications?no  Do you have a history or close family history of breast, ovarian, tubal or peritoneal cancer or a family member with BRCA (breast cancer susceptibility 1 and 2) gene mutations?  Yes, had breast cancer in 2003.  Request home vitals (wt, BP, etc.) and enter into vitals, THEN update Vital Signs SmartPhrase below at the top of the HPI. See below.   Nurse/Assistant Credentials/time stamp: Karpuih M/CMA/2:56pm   ----------------------------------------------------------------------------------------------------------------------------------------------------------------------------------------------------------------------  Vital Signs: Vital signs are patient reported. See below in patient exam.    MEDICARE ANNUAL PREVENTIVE VISIT WITH PROVIDER: (Welcome to Medicare, initial annual wellness or annual wellness exam)  Virtual Visit via Video Note  I connected with Amarelis Scheiman on 03/03/23 by a video enabled telemedicine application and verified that I am speaking with the correct person using two identifiers.  Location patient: home Location provider:work or home office Persons participating in the virtual visit: patient, provider  Concerns and/or follow up today: no concerns   See HM section in Epic for other details of completed HM.    ROS: negative for report of fevers, unintentional weight loss, vision changes, vision loss, hearing loss or change, chest pain, sob, hemoptysis, melena, hematochezia, hematuria, falls, bleeding or bruising, thoughts of suicide or self harm,  memory loss  Patient-completed extensive health risk  assessment - reviewed and discussed with the patient: See Health Risk Assessment completed with patient prior to the visit either above or in recent phone note. This was reviewed in detailed with the patient today and appropriate recommendations, orders and referrals were placed as needed per Summary below and patient instructions.   Review of Medical History: -PMH, PSH, Family History and current specialty and care providers reviewed and updated and listed below   Patient Care Team: Shirline Frees, NP as PCP - General (Family Medicine) Verner Chol, [facility] (Inactive) as Pharmacist (Pharmacist)   Past Medical History:  Diagnosis Date   Basal cell carcinoma 2007   BCC   Breast cancer (HCC)    Invasive lobular carcinoma RIGHT breast   Carotid bruit 08/2008   Normal Doppler   DUB (dysfunctional uterine bleeding)    Esophagitis    HTN (hypertension)    Tx started 06/2010   Hyperlipidemia    Hypothyroidism    MHA (microangiopathic hemolytic anemia) (HCC)    Personal history of chemotherapy    2003   Personal history of radiation therapy    2003 right breast   Spinal stenosis     Past Surgical History:  Procedure Laterality Date   BREAST BIOPSY Right    2007 benign   BREAST LUMPECTOMY Right    2003   BTL     CATARACT EXTRACTION, BILATERAL Bilateral 03/26/2016   still blurred vision; Md states this is fine   CERVICAL LAMINECTOMY     Left   FOOT SURGERY     SHOULDER ARTHROSCOPY Right    TUBAL LIGATION  1981    Social History   Socioeconomic History   Marital status: Married    Spouse name: Not on file   Number of children: Not on file   Years of education: Not on file   Highest education level: Not on file  Occupational History   Not on file  Tobacco Use   Smoking status: Never   Smokeless tobacco: Never  Vaping Use   Vaping status: Never Used  Substance and Sexual Activity   Alcohol use: No   Drug use: No   Sexual activity: Not on file  Other Topics  Concern   Not on file  Social History Narrative   Retired in March    Married for 38 years    One daughter, lives in [place]    Has four grandchildren      Likes to play with grand kids, go to [employer] and mountains.       Social Determinants of Health   Financial Resource Strain: Low Risk  (03/03/2023)   Overall Financial Resource Strain (CARDIA)    Difficulty of Paying Living Expenses: Not hard at all  Food Insecurity: No Food Insecurity (04/12/2022)   Hunger Vital Sign    Worried About Running Out of Food in the Last Year: Never true    Ran Out of Food in the Last Year: Never true  Transportation Needs: No Transportation Needs (03/03/2023)   PRAPARE - Administrator, Civil Service (Medical): No    Lack of Transportation (Non-Medical): No  Physical Activity: Sufficiently Active (03/03/2023)   Exercise Vital Sign    Days of Exercise per Week: 3 days    Minutes of Exercise per Session: 150+ min  Stress: No Stress Concern Present (03/03/2023)   [employer] of Occupational Health - Occupational Stress Questionnaire    Feeling of  Stress : Not at all  Social Connections: Moderately Isolated (03/03/2023)   Social Connection and Isolation Panel [NHANES]    Frequency of Communication with Friends and Family: More than three times a week    Frequency of Social Gatherings with Friends and Family: Once a week    Attends Religious Services: Never    Database administrator or Organizations: No    Attends Banker Meetings: Never    Marital Status: Married  Catering manager Violence: Not At Risk (04/08/2021)   Humiliation, Afraid, Rape, and Kick questionnaire    Fear of Current or Ex-Partner: No    Emotionally Abused: No    Physically Abused: No    Sexually Abused: No    Family History  Problem Relation Age of Onset   Heart attack Father    Coronary artery disease Father    Diabetes Father    Coronary artery disease Mother    Leukemia Mother    Thyroid disease  Mother    Multiple sclerosis Sister        Twin   Breast cancer Maternal Aunt     Current Outpatient Medications on File Prior to Visit  Medication Sig Dispense Refill   B Complex-C (B-COMPLEX WITH VITAMIN C) tablet Take 1 tablet by mouth daily.     calcium carbonate (OS-CAL) 600 MG TABS tablet Take 1 tablet (600 mg total) by mouth 2 (two) times daily with a meal. 180 tablet 3   Cholecalciferol (VITAMIN D3 PO) Take by mouth.     Coenzyme Q10 200 MG TABS Take 200 mg by mouth daily.     CRANBERRY PO Take 2 tablets by mouth at bedtime.     famotidine (PEPCID) 40 MG tablet TAKE 1 TABLET BY MOUTH EVERY DAY AT BEDTIME 90 tablet 0   GLUCOSAMINE PO Take 1 tablet by mouth 2 (two) times daily.     hydrochlorothiazide (MICROZIDE) 12.5 MG capsule Take 1 capsule by mouth once daily 90 capsule 0   hydrocortisone 2.5 % ointment Apply topically 2 (two) times daily. 30 g 2   levothyroxine (SYNTHROID) 75 MCG tablet TAKE 1 TABLET BY MOUTH ONCE DAILY BEFORE BREAKFAST 90 tablet 0   magnesium oxide (MAG-OX) 400 MG tablet Take 400 mg by mouth daily.     omeprazole (PRILOSEC) 20 MG capsule Take 1 capsule by mouth once daily 90 capsule 0   rosuvastatin (CRESTOR) 40 MG tablet Take 1 tablet by mouth once daily 90 tablet 0   triamcinolone cream (KENALOG) 0.5 % Apply 1 Application topically 2 (two) times daily. (Patient not taking: Reported on 03/03/2023) 30 g 2   No current facility-administered medications on file prior to visit.    Allergies  Allergen Reactions   Ambien Cr [Zolpidem] Other (See Comments)    Hallucinations   Cozaar [Losartan] Cough   Erythromycin     REACTION: rash   Hydrocodone Other (See Comments)    Hallucinations   Lisinopril Cough   Methyldibromoglutaronitrile Dermatitis   Penicillins     REACTION: rash   Bromine Rash    Per dermatologist    Clindamycin/Lincomycin Rash       Physical Exam Vitals requested from patient and listed below if patient had equipment and was able to  obtain at home for this virtual visit: Vitals:   03/03/23 1438  BP: 114/64  Pulse: 77  Temp: 97.7 F (36.5 C)   Estimated body mass index is 26.83 kg/m as calculated from the following:   Height  as of this encounter: 5\' 1"  (1.549 m).   Weight as of this encounter: 142 lb (64.4 kg).  EKG (optional): deferred due to virtual visit  GENERAL: alert, oriented, no acute distress detected, full vision exam deferred due to pandemic and/or virtual encounter  HEENT: atraumatic, conjunttiva clear, no obvious abnormalities on inspection of external nose and ears  NECK: normal movements of the head and neck  LUNGS: on inspection no signs of respiratory distress, breathing rate appears normal, no obvious gross SOB, gasping or wheezing  CV: no obvious cyanosis  MS: moves all visible extremities without noticeable abnormality  PSYCH/NEURO: pleasant and cooperative, no obvious depression or anxiety, speech and thought processing grossly intact, Cognitive function grossly intact  Flowsheet Row Video Visit from 03/03/2023 in [facility] [place]  PHQ-9 Total Score 0           03/03/2023    2:46 PM 04/12/2022    1:33 PM 09/24/2021    1:47 PM 05/26/2021    8:27 AM 04/08/2021    3:23 PM  Depression screen PHQ 2/9  Decreased Interest 0 0 0 0 0  Down, Depressed, Hopeless 0 0 0 0 0  PHQ - 2 Score 0 0 0 0 0  Altered sleeping 0   0   Tired, decreased energy 0   0   Change in appetite 0   0   Feeling bad or failure about yourself  0   0   Trouble concentrating 0   0   Moving slowly or fidgety/restless 0   0   Suicidal thoughts 0   0   PHQ-9 Score 0   0        05/23/2020    9:10 AM 04/08/2021    3:25 PM 05/26/2021    8:06 AM 04/12/2022    1:32 PM 03/03/2023    2:42 PM  Fall Risk  Falls in the past year? 0 0 0 1 0  Was there an injury with Fall? 0 0 0 1 0  Fall Risk Category Calculator 0 0 0 2 0  Fall Risk Category (Retired) Low Low Low Moderate   (RETIRED) Patient Fall  Risk Level Low fall risk   Low fall risk   Patient at Risk for Falls Due to No Fall Risks Impaired vision No Fall Risks Medication side effect No Fall Risks  Fall risk Follow up Falls evaluation completed;Falls prevention discussed Falls prevention discussed  Falls evaluation completed;Education provided;Falls prevention discussed Falls evaluation completed     SUMMARY AND PLAN:  Encounter for Medicare annual wellness exam   Discussed applicable health maintenance/preventive health measures and advised and referred or ordered per patient preferences: -reports gets bone density with gyn and did last year, will request staff obtain report for PCP discussed vaccines due and she plans to do at the pharmacy Health Maintenance  Topic Date Due   COVID-19 Vaccine (7 - 2023-24 season) 06/26/2022   INFLUENZA VACCINE  02/24/2023   Diabetic kidney evaluation - Urine ACR  05/28/2027 (Originally 02/23/1969)   Diabetic kidney evaluation - eGFR measurement  11/02/2023   MAMMOGRAM  02/25/2024   Medicare Annual Wellness (AWV)  03/02/2024   DTaP/Tdap/Td (4 - Td or Tdap) 05/28/2032   Colonoscopy  11/03/2032   Pneumonia Vaccine 8+ Years old  Completed   DEXA SCAN  Completed   Hepatitis C Screening  Completed   Zoster Vaccines- Shingrix  Completed   HPV VACCINES  Aged Out   FOOT EXAM  Discontinued   HEMOGLOBIN A1C  Discontinued   OPHTHALMOLOGY EXAM  Discontinued     Education and counseling on the following was provided based on the above review of health and a plan/checklist for the patient, along with additional information discussed, was provided for the patient in the patient instructions :   -Advised and counseled on a healthy lifestyle -Reviewed patient's current diet. Advised and counseled on a whole foods based healthy diet. A summary of a healthy diet was provided in the Patient Instructions.  -reviewed patient's current physical activity level and discussed exercise guidelines for adults.  Discussed community resources and ideas for safe exercise at home to assist in meeting exercise guideline recommendations in a safe and healthy way.  -Advise yearly dental visits at minimum and regular eye exams   Follow up: see patient instructions     Patient Instructions  I really enjoyed getting to talk with you today! I am available on Tuesdays and Thursdays for virtual visits if you have any questions or concerns, or if I can be of any further assistance.   CHECKLIST FROM ANNUAL WELLNESS VISIT:  -Follow up (please call to schedule if not scheduled after visit):   -yearly for annual wellness visit with primary care office  Here is a list of your preventive care/health maintenance measures and the plan for each if any are due:  PLAN For any measures below that may be due:  -updated covid and flu shots can be obtained in the pharmacy in the fall - please obtain receipt and share with your primary provider so that chart can be updated. Health Maintenance  Topic Date Due   COVID-19 Vaccine (7 - 2023-24 season) 06/26/2022   INFLUENZA VACCINE  02/24/2023   Diabetic kidney evaluation - Urine ACR  05/28/2027 (Originally 02/23/1969)   Diabetic kidney evaluation - eGFR measurement  11/02/2023   MAMMOGRAM  02/25/2024   Medicare Annual Wellness (AWV)  03/02/2024   DTaP/Tdap/Td (4 - Td or Tdap) 05/28/2032   Colonoscopy  11/03/2032   Pneumonia Vaccine 48+ Years old  Completed   DEXA SCAN  Completed   Hepatitis C Screening  Completed   Zoster Vaccines- Shingrix  Completed   HPV VACCINES  Aged Out   FOOT EXAM  Discontinued   HEMOGLOBIN A1C  Discontinued   OPHTHALMOLOGY EXAM  Discontinued    -See a dentist at least yearly  -Get your eyes checked and then per your eye specialist's recommendations  -Other issues addressed today:   -I have included below further information regarding a healthy whole foods based diet, physical activity guidelines for adults, stress management and  opportunities for social connections. I hope you find this information useful.   -----------------------------------------------------------------------------------------------------------------------------------------------------------------------------------------------------------------------------------------------------------  NUTRITION: -eat real food: lots of colorful vegetables (half the plate) and fruits -5-7 servings of vegetables and fruits per day (fresh or steamed is best), exp. 2 servings of vegetables with lunch and dinner and 2 servings of fruit per day. Berries and greens such as kale and collards are great choices.  -consume on a regular basis: whole grains (make sure first ingredient on label contains the word "whole"), fresh fruits, fish, nuts, seeds, healthy oils (such as olive oil, avocado oil, grape seed oil) -may eat small amounts of dairy and lean meat on occasion, but avoid processed meats such as ham, bacon, lunch meat, etc. -drink water -try to avoid fast food and pre-packaged foods, processed meat -most experts advise limiting sodium to < 2300mg  per day, should limit further is  any chronic conditions such as high blood pressure, heart disease, diabetes, etc. The American Heart Association advised that < 1500mg  is is ideal -try to avoid foods that contain any ingredients with names you do not recognize  -try to avoid sugar/sweets (except for the natural sugar that occurs in fresh fruit) -try to avoid sweet drinks -try to avoid white rice, white bread, pasta (unless whole grain), white or yellow potatoes  EXERCISE GUIDELINES FOR ADULTS: -if you wish to increase your physical activity, do so gradually and with the approval of your doctor -STOP and seek medical care immediately if you have any chest pain, chest discomfort or trouble breathing when starting or increasing exercise  -move and stretch your body, legs, feet and arms when sitting for long periods -Physical  activity guidelines for optimal health in adults: -least 150 minutes per week of aerobic exercise (can talk, but not sing) once approved by your doctor, 20-30 minutes of sustained activity or two 10 minute episodes of sustained activity every day.  -resistance training at least 2 days per week if approved by your doctor -balance exercises 3+ days per week:   Stand somewhere where you have something sturdy to hold onto if you lose balance.    1) lift up on toes, start with 5x per day and work up to 20x   2) stand and lift on leg straight out to the side so that foot is a few inches of the floor, start with 5x each side and work up to 20x each side   3) stand on one foot, start with 5 seconds each side and work up to 20 seconds on each side  If you need ideas or help with getting more active:  -Silver sneakers https://tools.silversneakers.com  -Walk with a Doc: http://www.duncan-williams.com/  -try to include resistance (weight lifting/strength building) and balance exercises twice per week: or the following link for ideas: http://castillo-powell.com/  BuyDucts.dk  STRESS MANAGEMENT: -can try meditating, or just sitting quietly with deep breathing while intentionally relaxing all parts of your body for 5 minutes daily -if you need further help with stress, anxiety or depression please follow up with your primary doctor or contact the wonderful folks at [facility]: [phone]  SOCIAL CONNECTIONS: -options in [place] if you wish to engage in more social and exercise related activities:  -Silver sneakers https://tools.silversneakers.com  -Walk with a Doc: http://www.duncan-williams.com/  -Check out the [facility] Active Adults 50+ section on the [place] [employer] (hiking clubs, book clubs, cards and games, chess, exercise classes, aquatic classes and much more) - see the website for  details: https://www.[place].gov/departments/parks-recreation/active-adults50  -YouTube has lots of exercise videos for different ages and abilities as well  -[facility] (a variety of indoor and outdoor inperson activities for adults). [phone]. [address]  -Virtual Online Classes (a variety of topics): see seniorplanet.org or call [phone]  -consider volunteering at a school, hospice center, church, senior center or elsewhere           Terressa Koyanagi, DO

## 2023-03-03 NOTE — Patient Instructions (Addendum)
I really enjoyed getting to talk with you today! I am available on Tuesdays and Thursdays for virtual visits if you have any questions or concerns, or if I can be of any further assistance.   CHECKLIST FROM ANNUAL WELLNESS VISIT:  -Follow up (please call to schedule if not scheduled after visit):   -yearly for annual wellness visit with primary care office  Here is a list of your preventive care/health maintenance measures and the plan for each if any are due:  PLAN For any measures below that may be due:  -updated covid and flu shots can be obtained in the pharmacy in the fall - please obtain receipt and share with your primary provider so that chart can be updated. Health Maintenance  Topic Date Due   COVID-19 Vaccine (7 - 2023-24 season) 06/26/2022   INFLUENZA VACCINE  02/24/2023   Diabetic kidney evaluation - Urine ACR  05/28/2027 (Originally 02/23/1969)   Diabetic kidney evaluation - eGFR measurement  11/02/2023   MAMMOGRAM  02/25/2024   Medicare Annual Wellness (AWV)  03/02/2024   DTaP/Tdap/Td (4 - Td or Tdap) 05/28/2032   Colonoscopy  11/03/2032   Pneumonia Vaccine 71+ Years old  Completed   DEXA SCAN  Completed   Hepatitis C Screening  Completed   Zoster Vaccines- Shingrix  Completed   HPV VACCINES  Aged Out   FOOT EXAM  Discontinued   HEMOGLOBIN A1C  Discontinued   OPHTHALMOLOGY EXAM  Discontinued    -See a dentist at least yearly  -Get your eyes checked and then per your eye specialist's recommendations  -Other issues addressed today:   -I have included below further information regarding a healthy whole foods based diet, physical activity guidelines for adults, stress management and opportunities for social connections. I hope you find this information useful.    -----------------------------------------------------------------------------------------------------------------------------------------------------------------------------------------------------------------------------------------------------------  NUTRITION: -eat real food: lots of colorful vegetables (half the plate) and fruits -5-7 servings of vegetables and fruits per day (fresh or steamed is best), exp. 2 servings of vegetables with lunch and dinner and 2 servings of fruit per day. Berries and greens such as kale and collards are great choices.  -consume on a regular basis: whole grains (make sure first ingredient on label contains the word "whole"), fresh fruits, fish, nuts, seeds, healthy oils (such as olive oil, avocado oil, grape seed oil) -may eat small amounts of dairy and lean meat on occasion, but avoid processed meats such as ham, bacon, lunch meat, etc. -drink water -try to avoid fast food and pre-packaged foods, processed meat -most experts advise limiting sodium to < 2300mg  per day, should limit further is any chronic conditions such as high blood pressure, heart disease, diabetes, etc. The American Heart Association advised that < 1500mg  is is ideal -try to avoid foods that contain any ingredients with names you do not recognize  -try to avoid sugar/sweets (except for the natural sugar that occurs in fresh fruit) -try to avoid sweet drinks -try to avoid white rice, white bread, pasta (unless whole grain), white or yellow potatoes  EXERCISE GUIDELINES FOR ADULTS: -if you wish to increase your physical activity, do so gradually and with the approval of your doctor -STOP and seek medical care immediately if you have any chest pain, chest discomfort or trouble breathing when starting or increasing exercise  -move and stretch your body, legs, feet and arms when sitting for long periods -Physical activity guidelines for optimal health in adults: -least 150 minutes per week of  aerobic exercise (can  talk, but not sing) once approved by your doctor, 20-30 minutes of sustained activity or two 10 minute episodes of sustained activity every day.  -resistance training at least 2 days per week if approved by your doctor -balance exercises 3+ days per week:   Stand somewhere where you have something sturdy to hold onto if you lose balance.    1) lift up on toes, start with 5x per day and work up to 20x   2) stand and lift on leg straight out to the side so that foot is a few inches of the floor, start with 5x each side and work up to 20x each side   3) stand on one foot, start with 5 seconds each side and work up to 20 seconds on each side  If you need ideas or help with getting more active:  -Silver sneakers https://tools.silversneakers.com  -Walk with a Doc: http://www.duncan-williams.com/  -try to include resistance (weight lifting/strength building) and balance exercises twice per week: or the following link for ideas: http://castillo-powell.com/  BuyDucts.dk  STRESS MANAGEMENT: -can try meditating, or just sitting quietly with deep breathing while intentionally relaxing all parts of your body for 5 minutes daily -if you need further help with stress, anxiety or depression please follow up with your primary doctor or contact the wonderful folks at [facility]: [phone]  SOCIAL CONNECTIONS: -options in [place] if you wish to engage in more social and exercise related activities:  -Silver sneakers https://tools.silversneakers.com  -Walk with a Doc: http://www.duncan-williams.com/  -Check out the [facility] Active Adults 50+ section on the [place] of [employer] (hiking clubs, book clubs, cards and games, chess, exercise classes, aquatic classes and much more) - see the website for  details: https://www.[place].gov/departments/parks-recreation/active-adults50  -YouTube has lots of exercise videos for different ages and abilities as well  -[facility] (a variety of indoor and outdoor inperson activities for adults). [phone]. [address]  -Virtual Online Classes (a variety of topics): see seniorplanet.org or call [phone]  -consider volunteering at a school, hospice center, church, senior center or elsewhere

## 2023-03-10 ENCOUNTER — Ambulatory Visit
Admission: RE | Admit: 2023-03-10 | Discharge: 2023-03-10 | Disposition: A | Discharge status: Home / Self Care | Payer: PPO | Source: Ambulatory Visit | Attending: Obstetrics and Gynecology | Admitting: Obstetrics and Gynecology

## 2023-03-10 DIAGNOSIS — Z1231 Encounter for screening mammogram for malignant neoplasm of breast: Secondary | ICD-10-CM | POA: Diagnosis not present

## 2023-03-16 DIAGNOSIS — L309 Dermatitis, unspecified: Secondary | ICD-10-CM | POA: Diagnosis not present

## 2023-03-16 DIAGNOSIS — Z85828 Personal history of other malignant neoplasm of skin: Secondary | ICD-10-CM | POA: Diagnosis not present

## 2023-03-16 DIAGNOSIS — L814 Other melanin hyperpigmentation: Secondary | ICD-10-CM | POA: Diagnosis not present

## 2023-03-16 DIAGNOSIS — D229 Melanocytic nevi, unspecified: Secondary | ICD-10-CM | POA: Diagnosis not present

## 2023-03-16 DIAGNOSIS — L821 Other seborrheic keratosis: Secondary | ICD-10-CM | POA: Diagnosis not present

## 2023-03-16 DIAGNOSIS — D1801 Hemangioma of skin and subcutaneous tissue: Secondary | ICD-10-CM | POA: Diagnosis not present

## 2023-03-16 DIAGNOSIS — L578 Other skin changes due to chronic exposure to nonionizing radiation: Secondary | ICD-10-CM | POA: Diagnosis not present

## 2023-03-30 ENCOUNTER — Other Ambulatory Visit: Payer: Self-pay | Admitting: Adult Health

## 2023-04-01 ENCOUNTER — Encounter: Payer: Self-pay | Admitting: Adult Health

## 2023-04-19 ENCOUNTER — Other Ambulatory Visit: Payer: Self-pay | Admitting: Adult Health

## 2023-04-19 DIAGNOSIS — E039 Hypothyroidism, unspecified: Secondary | ICD-10-CM

## 2023-04-19 DIAGNOSIS — Z760 Encounter for issue of repeat prescription: Secondary | ICD-10-CM

## 2023-04-21 ENCOUNTER — Other Ambulatory Visit: Payer: Self-pay | Admitting: Adult Health

## 2023-05-20 ENCOUNTER — Other Ambulatory Visit: Payer: Self-pay | Admitting: Adult Health

## 2023-05-28 ENCOUNTER — Other Ambulatory Visit: Payer: Self-pay | Admitting: Adult Health

## 2023-05-31 ENCOUNTER — Ambulatory Visit: Payer: PPO | Admitting: Adult Health

## 2023-05-31 VITALS — BP 120/70 | HR 71 | Temp 98.3°F | Ht 61.0 in | Wt 142.0 lb

## 2023-05-31 DIAGNOSIS — I10 Essential (primary) hypertension: Secondary | ICD-10-CM

## 2023-05-31 DIAGNOSIS — E782 Mixed hyperlipidemia: Secondary | ICD-10-CM

## 2023-05-31 DIAGNOSIS — K219 Gastro-esophageal reflux disease without esophagitis: Secondary | ICD-10-CM | POA: Diagnosis not present

## 2023-05-31 DIAGNOSIS — E039 Hypothyroidism, unspecified: Secondary | ICD-10-CM | POA: Diagnosis not present

## 2023-05-31 DIAGNOSIS — Z0000 Encounter for general adult medical examination without abnormal findings: Secondary | ICD-10-CM | POA: Diagnosis not present

## 2023-05-31 DIAGNOSIS — G43811 Other migraine, intractable, with status migrainosus: Secondary | ICD-10-CM

## 2023-05-31 LAB — CBC WITH DIFFERENTIAL/PLATELET
Basophils Absolute: 0.1 10*3/uL (ref 0.0–0.1)
Basophils Relative: 1 % (ref 0.0–3.0)
Eosinophils Absolute: 0.2 10*3/uL (ref 0.0–0.7)
Eosinophils Relative: 4 % (ref 0.0–5.0)
HCT: 42.2 % (ref 36.0–46.0)
Hemoglobin: 13.8 g/dL (ref 12.0–15.0)
Lymphocytes Relative: 29.6 % (ref 12.0–46.0)
Lymphs Abs: 1.8 10*3/uL (ref 0.7–4.0)
MCHC: 32.6 g/dL (ref 30.0–36.0)
MCV: 93.5 fL (ref 78.0–100.0)
Monocytes Absolute: 0.4 10*3/uL (ref 0.1–1.0)
Monocytes Relative: 7.1 % (ref 3.0–12.0)
Neutro Abs: 3.6 10*3/uL (ref 1.4–7.7)
Neutrophils Relative %: 58.3 % (ref 43.0–77.0)
Platelets: 268 10*3/uL (ref 150.0–400.0)
RBC: 4.52 Mil/uL (ref 3.87–5.11)
RDW: 14.5 % (ref 11.5–15.5)
WBC: 6.2 10*3/uL (ref 4.0–10.5)

## 2023-05-31 LAB — LIPID PANEL
Cholesterol: 167 mg/dL (ref 0–200)
HDL: 73 mg/dL (ref >39.00)
LDL Cholesterol: 82 mg/dL (ref 0–99)
NonHDL: 93.63
Total CHOL/HDL Ratio: 2
Triglycerides: 58 mg/dL (ref 0.0–149.0)
VLDL: 11.6 mg/dL (ref 0.0–40.0)

## 2023-05-31 LAB — COMPREHENSIVE METABOLIC PANEL
ALT: 20 U/L (ref 0–35)
AST: 20 U/L (ref 0–37)
Albumin: 4.4 g/dL (ref 3.5–5.2)
Alkaline Phosphatase: 59 U/L (ref 39–117)
BUN: 15 mg/dL (ref 6–23)
CO2: 31 meq/L (ref 19–32)
Calcium: 9.5 mg/dL (ref 8.4–10.5)
Chloride: 100 meq/L (ref 96–112)
Creatinine, Ser: 0.66 mg/dL (ref 0.40–1.20)
GFR: 87.73 mL/min (ref >60.00)
Glucose, Bld: 90 mg/dL (ref 70–99)
Potassium: 3.4 meq/L — ABNORMAL LOW (ref 3.5–5.1)
Sodium: 141 meq/L (ref 135–145)
Total Bilirubin: 0.5 mg/dL (ref 0.2–1.2)
Total Protein: 7.3 g/dL (ref 6.0–8.3)

## 2023-05-31 LAB — TSH: TSH: 3.05 u[IU]/mL (ref 0.35–5.50)

## 2023-05-31 MED ORDER — LEVOTHYROXINE SODIUM 75 MCG PO TABS: 75.0000 ug | ORAL_TABLET | Freq: Every day | ORAL | 3 refills | Status: DC

## 2023-05-31 MED ORDER — HYDROCHLOROTHIAZIDE 12.5 MG PO CAPS: 12.5000 mg | ORAL_CAPSULE | Freq: Every day | ORAL | 3 refills | Status: DC

## 2023-05-31 MED ORDER — ROSUVASTATIN CALCIUM 40 MG PO TABS: 40.0000 mg | ORAL_TABLET | Freq: Every day | ORAL | 3 refills | Status: DC

## 2023-05-31 NOTE — Patient Instructions (Addendum)
It was great seeing you today   We will follow up with you regarding your lab work   Please let me know if you need anything   

## 2023-05-31 NOTE — Progress Notes (Signed)
Subjective:    Patient ID: Patricia Solis, female    DOB: 06-Aug-1950, 72 y.o.   MRN: [id number]  HPI Patient presents for yearly preventative medicine examination. She is a pleasant 72 year old female who  has a past medical history of Basal cell carcinoma (2007), Breast cancer (HCC), Carotid bruit (08/2008), DUB (dysfunctional uterine bleeding), Esophagitis, HTN (hypertension), Hyperlipidemia, Hypothyroidism, MHA (microangiopathic hemolytic anemia) (HCC), Personal history of chemotherapy, Personal history of radiation therapy, and Spinal stenosis.  Hypertension - managed with HCTZ 12.5 mg and Cozaar 25 mg daily.  She does check her blood pressure at home and reports readings in the 120s to 130s over 70s to 80s.  She denies dizziness, lightheadedness, chest pain, or shortness of breath BP Readings from Last 3 Encounters:  05/31/23 120/70  03/03/23 114/64  12/10/22 118/60    Hypothyroidism-maintained on Synthroid 88 mcg daily Lab Results  Component Value Date   TSH 2.74 05/27/2022    Hyperlipidemia-managed with simvastatin 40 mg daily.  She denies myalgia or fatigue Lab Results  Component Value Date   CHOL 174 05/27/2022   HDL 75.30 05/27/2022   LDLCALC 89 05/27/2022   LDLDIRECT 140.4 09/02/2006   TRIG 49.0 05/27/2022   CHOLHDL 2 05/27/2022    GERD-controlled with Prilosec 20 mg daily.   Migraine headaches-infrequent migraine headaches.  She has been seen by neurology in the past but did not have good results with the medications that they prescribed.  When she does have a migraine headache she will use BC powder at home and this seems to work better than prescription medications for her   All immunizations and health maintenance protocols were reviewed with the patient and needed orders were placed.  Appropriate screening laboratory values were ordered for the patient including screening of hyperlipidemia, renal function and hepatic function.   Medication reconciliation,   past medical history, social history, problem list and allergies were reviewed in detail with the patient  Goals were established with regard to weight loss, exercise, and  diet in compliance with medications. She continues to workout at the [facility] three days a week and is eating healthy.   Wt Readings from Last 3 Encounters:  05/31/23 142 lb (64.4 kg)  03/03/23 142 lb (64.4 kg)  12/10/22 142 lb (64.4 kg)   She has no acute complaints.   Review of Systems  Constitutional: Negative.   HENT: Negative.    Eyes: Negative.   Respiratory: Negative.    Cardiovascular: Negative.   Gastrointestinal: Negative.   Endocrine: Negative.   Genitourinary: Negative.   Musculoskeletal: Negative.   Skin: Negative.   Allergic/Immunologic: Negative.   Neurological: Negative.   Hematological: Negative.   Psychiatric/Behavioral: Negative.     Past Medical History:  Diagnosis Date   Basal cell carcinoma 2007   BCC   Breast cancer (HCC)    Invasive lobular carcinoma RIGHT breast   Carotid bruit 08/2008   Normal Doppler   DUB (dysfunctional uterine bleeding)    Esophagitis    HTN (hypertension)    Tx started 06/2010   Hyperlipidemia    Hypothyroidism    MHA (microangiopathic hemolytic anemia) (HCC)    Personal history of chemotherapy    2003   Personal history of radiation therapy    2003 right breast   Spinal stenosis     Social History   Socioeconomic History   Marital status: Married    Spouse name: Not on file   Number of children: Not on file  Years of education: Not on file   Highest education level: Not on file  Occupational History   Not on file  Tobacco Use   Smoking status: Never   Smokeless tobacco: Never  Vaping Use   Vaping status: Never Used  Substance and Sexual Activity   Alcohol use: No   Drug use: No   Sexual activity: Not on file  Other Topics Concern   Not on file  Social History Narrative   Retired in March    Married for 38 years    One daughter,  lives in [place]    Has four grandchildren      Likes to play with grand kids, go to [employer] and mountains.       Social Determinants of Health   Financial Resource Strain: Low Risk  (03/03/2023)   Overall Financial Resource Strain (CARDIA)    Difficulty of Paying Living Expenses: Not hard at all  Food Insecurity: No Food Insecurity (04/12/2022)   Hunger Vital Sign    Worried About Running Out of Food in the Last Year: Never true    Ran Out of Food in the Last Year: Never true  Transportation Needs: No Transportation Needs (03/03/2023)   PRAPARE - Administrator, Civil Service (Medical): No    Lack of Transportation (Non-Medical): No  Physical Activity: Sufficiently Active (03/03/2023)   Exercise Vital Sign    Days of Exercise per Week: 3 days    Minutes of Exercise per Session: 150+ min  Stress: No Stress Concern Present (03/03/2023)   [employer] of Occupational Health - Occupational Stress Questionnaire    Feeling of Stress : Not at all  Social Connections: Moderately Isolated (03/03/2023)   Social Connection and Isolation Panel [NHANES]    Frequency of Communication with Friends and Family: More than three times a week    Frequency of Social Gatherings with Friends and Family: Once a week    Attends Religious Services: Never    Database administrator or Organizations: No    Attends Banker Meetings: Never    Marital Status: Married  Catering manager Violence: Not At Risk (04/08/2021)   Humiliation, Afraid, Rape, and Kick questionnaire    Fear of Current or Ex-Partner: No    Emotionally Abused: No    Physically Abused: No    Sexually Abused: No    Past Surgical History:  Procedure Laterality Date   BREAST BIOPSY Right    2007 benign   BREAST LUMPECTOMY Right    2003   BTL     CATARACT EXTRACTION, BILATERAL Bilateral 03/26/2016   still blurred vision; Md states this is fine   CERVICAL LAMINECTOMY     Left   FOOT SURGERY     SHOULDER ARTHROSCOPY  Right    TUBAL LIGATION  1981    Family History  Problem Relation Age of Onset   Heart attack Father    Coronary artery disease Father    Diabetes Father    Coronary artery disease Mother    Leukemia Mother    Thyroid disease Mother    Multiple sclerosis Sister        Twin   Breast cancer Maternal Aunt     Allergies  Allergen Reactions   Ambien Cr [Zolpidem] Other (See Comments)    Hallucinations   Cozaar [Losartan] Cough   Erythromycin     REACTION: rash   Hydrocodone Other (See Comments)    Hallucinations   Lisinopril Cough  Methyldibromoglutaronitrile Dermatitis   Penicillins     REACTION: rash   Bromine Rash    Per dermatologist    Clindamycin/Lincomycin Rash    Current Outpatient Medications on File Prior to Visit  Medication Sig Dispense Refill   B Complex-C (B-COMPLEX WITH VITAMIN C) tablet Take 1 tablet by mouth daily.     calcium carbonate (OS-CAL) 600 MG TABS tablet Take 1 tablet (600 mg total) by mouth 2 (two) times daily with a meal. 180 tablet 3   Cholecalciferol (VITAMIN D3 PO) Take by mouth.     Coenzyme Q10 200 MG TABS Take 200 mg by mouth daily.     CRANBERRY PO Take 2 tablets by mouth at bedtime.     famotidine (PEPCID) 40 MG tablet TAKE 1 TABLET BY MOUTH ONCE DAILY AT BEDTIME 90 tablet 0   GLUCOSAMINE PO Take 1 tablet by mouth 2 (two) times daily.     hydrochlorothiazide (MICROZIDE) 12.5 MG capsule Take 1 capsule by mouth once daily 90 capsule 0   hydrocortisone 2.5 % ointment Apply topically 2 (two) times daily. 30 g 2   levothyroxine (SYNTHROID) 75 MCG tablet TAKE 1 TABLET BY MOUTH ONCE DAILY BEFORE BREAKFAST 90 tablet 0   magnesium oxide (MAG-OX) 400 MG tablet Take 400 mg by mouth daily.     omeprazole (PRILOSEC) 20 MG capsule Take 1 capsule by mouth once daily 90 capsule 0   rosuvastatin (CRESTOR) 40 MG tablet Take 1 tablet by mouth once daily 90 tablet 0   triamcinolone cream (KENALOG) 0.5 % Apply 1 Application topically 2 (two) times daily.  30 g 2   No current facility-administered medications on file prior to visit.    BP 120/70   Pulse 71   Temp 98.3 F (36.8 C) (Oral)   Ht 5\' 1"  (1.549 m)   Wt 142 lb (64.4 kg)   SpO2 97%   BMI 26.83 kg/m       Objective:   Physical Exam Vitals and nursing note reviewed.  Constitutional:      General: She is not in acute distress.    Appearance: Normal appearance. She is not ill-appearing.  HENT:     Head: Normocephalic and atraumatic.     Right Ear: Tympanic membrane, ear canal and external ear normal. There is no impacted cerumen.     Left Ear: Tympanic membrane, ear canal and external ear normal. There is no impacted cerumen.     Nose: Nose normal. No congestion or rhinorrhea.     Mouth/Throat:     Mouth: Mucous membranes are moist.     Pharynx: Oropharynx is clear.  Eyes:     Extraocular Movements: Extraocular movements intact.     Conjunctiva/sclera: Conjunctivae normal.     Pupils: Pupils are equal, round, and reactive to light.  Neck:     Vascular: No carotid bruit.  Cardiovascular:     Rate and Rhythm: Normal rate and regular rhythm.     Pulses: Normal pulses.     Heart sounds: No murmur heard.    No friction rub. No gallop.  Pulmonary:     Effort: Pulmonary effort is normal.     Breath sounds: Normal breath sounds.  Abdominal:     General: Abdomen is flat. Bowel sounds are normal. There is no distension.     Palpations: Abdomen is soft. There is no mass.     Tenderness: There is no abdominal tenderness. There is no guarding or rebound.     Hernia:  No hernia is present.  Musculoskeletal:        General: Normal range of motion.     Cervical back: Normal range of motion and neck supple.  Lymphadenopathy:     Cervical: No cervical adenopathy.  Skin:    General: Skin is warm and dry.     Capillary Refill: Capillary refill takes less than 2 seconds.  Neurological:     General: No focal deficit present.     Mental Status: She is alert and oriented to  person, place, and time.  Psychiatric:        Mood and Affect: Mood normal.        Behavior: Behavior normal.        Thought Content: Thought content normal.        Judgment: Judgment normal.       Assessment & Plan:  1. Routine general medical examination at a health care facility Today patient counseled on age appropriate routine health concerns for screening and prevention, each reviewed and up to date or declined. Immunizations reviewed and up to date or declined. Labs ordered and reviewed. Risk factors for depression reviewed and negative. Hearing function and visual acuity are intact. ADLs screened and addressed as needed. Functional ability and level of safety reviewed and appropriate. Education, counseling and referrals performed based on assessed risks today. Patient provided with a copy of personalized plan for preventive services. - Continue to eat healthy and exercise - Follow up in one year or sooner if needed   2. Essential hypertension - Well controlled. No change in medication  - CBC with Differential/Platelet; Future - Comprehensive metabolic panel; Future - Lipid panel; Future - TSH; Future - hydrochlorothiazide (MICROZIDE) 12.5 MG capsule; Take 1 capsule (12.5 mg total) by mouth daily.  Dispense: 90 capsule; Refill: 3  3. Hypothyroidism, unspecified type - Continue with Synthroid 75 mg.  - CBC with Differential/Platelet; Future - Comprehensive metabolic panel; Future - Lipid panel; Future - TSH; Future - levothyroxine (SYNTHROID) 75 MCG tablet; Take 1 tablet (75 mcg total) by mouth daily before breakfast.  Dispense: 90 tablet; Refill: 3  4. Mixed hyperlipidemia - Continue with Crestor 40 mg  - CBC with Differential/Platelet; Future - Comprehensive metabolic panel; Future - Lipid panel; Future - TSH; Future - rosuvastatin (CRESTOR) 40 MG tablet; Take 1 tablet (40 mg total) by mouth daily.  Dispense: 90 tablet; Refill: 3  5. Gastroesophageal reflux disease,  unspecified whether esophagitis present - Continue with PPI  - CBC with Differential/Platelet; Future - Comprehensive metabolic panel; Future - Lipid panel; Future - TSH; Future  6. Other migraine with status migrainosus, intractable - Continue with OTC medications  - CBC with Differential/Platelet; Future - Comprehensive metabolic panel; Future - Lipid panel; Future - TSH; Future  Shirline Frees, NP

## 2023-06-13 ENCOUNTER — Other Ambulatory Visit: Payer: Self-pay | Admitting: Adult Health

## 2023-07-06 ENCOUNTER — Other Ambulatory Visit: Payer: Self-pay | Admitting: Orthopedic Surgery

## 2023-07-11 ENCOUNTER — Ambulatory Visit: Payer: PPO | Admitting: Internal Medicine

## 2023-07-18 ENCOUNTER — Other Ambulatory Visit: Payer: Self-pay | Admitting: Adult Health

## 2023-07-18 DIAGNOSIS — I10 Essential (primary) hypertension: Secondary | ICD-10-CM

## 2023-08-01 ENCOUNTER — Other Ambulatory Visit: Payer: Self-pay

## 2023-08-01 ENCOUNTER — Encounter (HOSPITAL_BASED_OUTPATIENT_CLINIC_OR_DEPARTMENT_OTHER): Payer: Self-pay | Admitting: Orthopedic Surgery

## 2023-08-02 ENCOUNTER — Encounter (HOSPITAL_BASED_OUTPATIENT_CLINIC_OR_DEPARTMENT_OTHER)
Admission: RE | Admit: 2023-08-02 | Discharge: 2023-08-02 | Disposition: A | Discharge status: Home / Self Care | Payer: PPO | Source: Ambulatory Visit | Attending: Orthopedic Surgery | Admitting: Orthopedic Surgery

## 2023-08-02 DIAGNOSIS — Z01818 Encounter for other preprocedural examination: Secondary | ICD-10-CM | POA: Diagnosis not present

## 2023-08-02 LAB — BASIC METABOLIC PANEL
Anion gap: 10 (ref 5–15)
BUN: 18 mg/dL (ref 8–23)
CO2: 27 mmol/L (ref 22–32)
Calcium: 9.6 mg/dL (ref 8.9–10.3)
Chloride: 105 mmol/L (ref 98–111)
Creatinine, Ser: 0.7 mg/dL (ref 0.44–1.00)
GFR, Estimated: >60 mL/min (ref >60)
Glucose, Bld: 99 mg/dL (ref 70–99)
Potassium: 3.2 mmol/L — ABNORMAL LOW (ref 3.5–5.1)
Sodium: 142 mmol/L (ref 135–145)

## 2023-08-02 NOTE — Progress Notes (Signed)

## 2023-08-08 ENCOUNTER — Encounter (HOSPITAL_BASED_OUTPATIENT_CLINIC_OR_DEPARTMENT_OTHER): Payer: Self-pay | Admitting: Orthopedic Surgery

## 2023-08-08 ENCOUNTER — Ambulatory Visit (HOSPITAL_BASED_OUTPATIENT_CLINIC_OR_DEPARTMENT_OTHER): Payer: PPO | Admitting: Anesthesiology

## 2023-08-08 ENCOUNTER — Other Ambulatory Visit: Payer: Self-pay

## 2023-08-08 ENCOUNTER — Encounter (HOSPITAL_BASED_OUTPATIENT_CLINIC_OR_DEPARTMENT_OTHER)
Admission: RE | Disposition: A | Discharge status: Home / Self Care | Payer: Self-pay | Source: Home / Self Care | Attending: Orthopedic Surgery

## 2023-08-08 ENCOUNTER — Ambulatory Visit (HOSPITAL_BASED_OUTPATIENT_CLINIC_OR_DEPARTMENT_OTHER)
Admission: RE | Admit: 2023-08-08 | Discharge: 2023-08-08 | Disposition: A | Discharge status: Home / Self Care | Payer: PPO | Attending: Orthopedic Surgery | Admitting: Orthopedic Surgery

## 2023-08-08 DIAGNOSIS — M1990 Unspecified osteoarthritis, unspecified site: Secondary | ICD-10-CM | POA: Insufficient documentation

## 2023-08-08 DIAGNOSIS — Z9221 Personal history of antineoplastic chemotherapy: Secondary | ICD-10-CM | POA: Diagnosis not present

## 2023-08-08 DIAGNOSIS — G5601 Carpal tunnel syndrome, right upper limb: Secondary | ICD-10-CM

## 2023-08-08 DIAGNOSIS — I10 Essential (primary) hypertension: Secondary | ICD-10-CM | POA: Diagnosis not present

## 2023-08-08 DIAGNOSIS — Z923 Personal history of irradiation: Secondary | ICD-10-CM | POA: Insufficient documentation

## 2023-08-08 DIAGNOSIS — Z01818 Encounter for other preprocedural examination: Secondary | ICD-10-CM

## 2023-08-08 DIAGNOSIS — K219 Gastro-esophageal reflux disease without esophagitis: Secondary | ICD-10-CM | POA: Diagnosis not present

## 2023-08-08 DIAGNOSIS — Z853 Personal history of malignant neoplasm of breast: Secondary | ICD-10-CM | POA: Diagnosis not present

## 2023-08-08 HISTORY — PX: CARPAL TUNNEL RELEASE: SHX101

## 2023-08-08 SURGERY — CARPAL TUNNEL RELEASE
Anesthesia: General | Site: Hand | Laterality: Right

## 2023-08-08 MED ORDER — OXYCODONE HCL 5 MG PO TABS: 5.0000 mg | ORAL_TABLET | Freq: Once | ORAL | Status: DC | PRN

## 2023-08-08 MED ORDER — SODIUM CHLORIDE 0.9 % IV SOLN: INTRAVENOUS | Status: DC | PRN

## 2023-08-08 MED ORDER — ACETAMINOPHEN 160 MG/5ML PO SOLN: 325.0000 mg | ORAL | Status: DC | PRN

## 2023-08-08 MED ORDER — ONDANSETRON HCL 4 MG/2ML IJ SOLN
INTRAMUSCULAR | Status: AC
  Filled 2023-08-08: qty 2

## 2023-08-08 MED ORDER — PROPOFOL 10 MG/ML IV BOLUS
INTRAVENOUS | Status: AC
  Filled 2023-08-08: qty 20

## 2023-08-08 MED ORDER — ACETAMINOPHEN 325 MG PO TABS: 325.0000 mg | ORAL_TABLET | ORAL | Status: DC | PRN

## 2023-08-08 MED ORDER — FENTANYL CITRATE (PF) 100 MCG/2ML IJ SOLN
INTRAMUSCULAR | Status: DC | PRN
  Administered 2023-08-08 (×2): 25 ug via INTRAVENOUS

## 2023-08-08 MED ORDER — BUPIVACAINE HCL (PF) 0.25 % IJ SOLN
INTRAMUSCULAR | Status: AC
  Filled 2023-08-08: qty 30

## 2023-08-08 MED ORDER — LACTATED RINGERS IV SOLN: INTRAVENOUS | Status: DC

## 2023-08-08 MED ORDER — ONDANSETRON HCL 4 MG/2ML IJ SOLN
INTRAMUSCULAR | Status: DC | PRN
  Administered 2023-08-08: 4 mg via INTRAVENOUS

## 2023-08-08 MED ORDER — PROPOFOL 10 MG/ML IV BOLUS
INTRAVENOUS | Status: DC | PRN
  Administered 2023-08-08: 120 mg via INTRAVENOUS

## 2023-08-08 MED ORDER — FENTANYL CITRATE (PF) 100 MCG/2ML IJ SOLN: 25.0000 ug | INTRAMUSCULAR | Status: DC | PRN

## 2023-08-08 MED ORDER — BUPIVACAINE HCL (PF) 0.25 % IJ SOLN
INTRAMUSCULAR | Status: DC | PRN
  Administered 2023-08-08: 9 mL

## 2023-08-08 MED ORDER — TRAMADOL HCL 50 MG PO TABS: 50.0000 mg | ORAL_TABLET | Freq: Four times a day (QID) | ORAL | 0 refills | Status: DC | PRN

## 2023-08-08 MED ORDER — OXYCODONE HCL 5 MG/5ML PO SOLN
5.0000 mg | Freq: Once | ORAL | Status: DC | PRN
Start: 2023-08-08 — End: 2023-08-08

## 2023-08-08 MED ORDER — LACTATED RINGERS IV SOLN: INTRAVENOUS | Status: DC | PRN

## 2023-08-08 MED ORDER — DEXAMETHASONE SODIUM PHOSPHATE 10 MG/ML IJ SOLN
INTRAMUSCULAR | Status: AC
  Filled 2023-08-08: qty 1

## 2023-08-08 MED ORDER — 0.9 % SODIUM CHLORIDE (POUR BTL) OPTIME
TOPICAL | Status: DC | PRN
  Administered 2023-08-08: 100 mL

## 2023-08-08 MED ORDER — FENTANYL CITRATE (PF) 100 MCG/2ML IJ SOLN
INTRAMUSCULAR | Status: AC
  Filled 2023-08-08: qty 2

## 2023-08-08 MED ORDER — CEFAZOLIN SODIUM-DEXTROSE 2-3 GM-%(50ML) IV SOLR
INTRAVENOUS | Status: DC | PRN
  Administered 2023-08-08: 2 g via INTRAVENOUS

## 2023-08-08 MED ORDER — DROPERIDOL 2.5 MG/ML IJ SOLN
0.6250 mg | Freq: Once | INTRAMUSCULAR | Status: DC | PRN
Start: 2023-08-08 — End: 2023-08-08

## 2023-08-08 MED ORDER — ACETAMINOPHEN 10 MG/ML IV SOLN
1000.0000 mg | Freq: Once | INTRAVENOUS | Status: DC | PRN
Start: 2023-08-08 — End: 2023-08-08

## 2023-08-08 MED ORDER — LIDOCAINE HCL (CARDIAC) PF 100 MG/5ML IV SOSY
PREFILLED_SYRINGE | INTRAVENOUS | Status: DC | PRN
  Administered 2023-08-08: 50 mg via INTRAVENOUS

## 2023-08-08 MED ORDER — MIDAZOLAM HCL 2 MG/2ML IJ SOLN
INTRAMUSCULAR | Status: AC
  Filled 2023-08-08: qty 2

## 2023-08-08 MED ORDER — CEFAZOLIN SODIUM 1 G IJ SOLR
INTRAMUSCULAR | Status: AC
  Filled 2023-08-08: qty 40

## 2023-08-08 MED ORDER — DEXAMETHASONE SODIUM PHOSPHATE 10 MG/ML IJ SOLN
INTRAMUSCULAR | Status: DC | PRN
  Administered 2023-08-08: 5 mg via INTRAVENOUS

## 2023-08-08 SURGICAL SUPPLY — 29 items
BLADE SURG 15 STRL LF DISP TIS (BLADE) ×2 IMPLANT
BNDG ELASTIC 3INX 5YD STR LF (GAUZE/BANDAGES/DRESSINGS) ×1 IMPLANT
BNDG ESMARK 4X9 LF (GAUZE/BANDAGES/DRESSINGS) IMPLANT
BNDG GAUZE DERMACEA FLUFF 4 (GAUZE/BANDAGES/DRESSINGS) ×1 IMPLANT
CHLORAPREP W/TINT 26 (MISCELLANEOUS) ×1 IMPLANT
CORD BIPOLAR FORCEPS 12FT (ELECTRODE) ×1 IMPLANT
COVER BACK TABLE 60X90IN (DRAPES) ×1 IMPLANT
COVER MAYO STAND STRL (DRAPES) ×1 IMPLANT
CUFF TOURN SGL QUICK 18X4 (TOURNIQUET CUFF) ×1 IMPLANT
DRAPE EXTREMITY T 121X128X90 (DISPOSABLE) ×1 IMPLANT
DRAPE SURG 17X23 STRL (DRAPES) ×1 IMPLANT
GAUZE PAD ABD 8X10 STRL (GAUZE/BANDAGES/DRESSINGS) ×1 IMPLANT
GAUZE SPONGE 4X4 12PLY STRL (GAUZE/BANDAGES/DRESSINGS) ×1 IMPLANT
GAUZE XEROFORM 1X8 LF (GAUZE/BANDAGES/DRESSINGS) ×1 IMPLANT
GLOVE BIO SURGEON STRL SZ7.5 (GLOVE) ×1 IMPLANT
GLOVE BIOGEL PI IND STRL 8 (GLOVE) ×1 IMPLANT
GOWN STRL REUS W/ TWL LRG LVL3 (GOWN DISPOSABLE) ×1 IMPLANT
GOWN STRL REUS W/TWL XL LVL3 (GOWN DISPOSABLE) ×1 IMPLANT
NDL HYPO 25X1 1.5 SAFETY (NEEDLE) ×1 IMPLANT
NEEDLE HYPO 25X1 1.5 SAFETY (NEEDLE) ×1 IMPLANT
NS IRRIG 1000ML POUR BTL (IV SOLUTION) ×1 IMPLANT
PACK BASIN DAY SURGERY FS (CUSTOM PROCEDURE TRAY) ×1 IMPLANT
PADDING CAST ABS COTTON 4X4 ST (CAST SUPPLIES) ×1 IMPLANT
STOCKINETTE 4X48 STRL (DRAPES) ×1 IMPLANT
SUT ETHILON 4 0 PS 2 18 (SUTURE) ×1 IMPLANT
SYR BULB EAR ULCER 3OZ GRN STR (SYRINGE) ×1 IMPLANT
SYR CONTROL 10ML LL (SYRINGE) ×1 IMPLANT
TOWEL GREEN STERILE FF (TOWEL DISPOSABLE) ×2 IMPLANT
UNDERPAD 30X36 HEAVY ABSORB (UNDERPADS AND DIAPERS) ×1 IMPLANT

## 2023-08-08 NOTE — Anesthesia Procedure Notes (Addendum)
 Procedure Name: LMA Insertion Date/Time: 08/08/2023 2:00 PM  Performed by: Burnard Rosaline HERO, CRNAPre-anesthesia Checklist: Patient identified, Emergency Drugs available, Suction available and Patient being monitored Patient Re-evaluated:Patient Re-evaluated prior to induction Oxygen Delivery Method: Circle system utilized Preoxygenation: Pre-oxygenation with 100% oxygen Induction Type: IV induction Ventilation: Mask ventilation without difficulty LMA: LMA inserted LMA Size: 4.0 Number of attempts: 1 Airway Equipment and Method: Bite block Placement Confirmation: positive ETCO2, breath sounds checked- equal and bilateral and CO2 detector Tube secured with: Tape Dental Injury: Teeth and Oropharynx as per pre-operative assessment

## 2023-08-08 NOTE — Discharge Instructions (Addendum)

## 2023-08-08 NOTE — Transfer of Care (Signed)
 Immediate Anesthesia Transfer of Care Note  Patient: Jenene Boone Enterline  Procedure(s) Performed: CARPAL TUNNEL RELEASE (Right: Hand)  Patient Location: PACU  Anesthesia Type:General  Level of Consciousness: awake and patient cooperative  Airway & Oxygen Therapy: Patient Spontanous Breathing and Patient connected to face mask oxygen  Post-op Assessment: Report given to RN and Post -op Vital signs reviewed and stable  Post vital signs: Reviewed and stable  Last Vitals:  Vitals Value Taken Time  BP 129/60 08/08/23 1357  Temp    Pulse 80 08/08/23 1358  Resp 16 08/08/23 1358  SpO2 97 % 08/08/23 1358  Vitals shown include unfiled device data.  Last Pain:  Vitals:   08/08/23 1140  TempSrc: Temporal  PainSc: 4          Complications: No notable events documented.

## 2023-08-08 NOTE — H&P (Signed)
 Patricia Solis is an 73 y.o. female.   Chief Complaint: carpal tunnel syndrome HPI: 73 y.o. yo female with numbness and tingling right hand.  She has had carpal tunnel injection which was helpful for a short time.  She has nocturnal symptoms that wake her.  She wishes to have right carpal tunnel release.   Allergies:  Allergies  Allergen Reactions   Ambien Cr [Zolpidem] Other (See Comments)    Hallucinations   Cozaar  [Losartan ] Cough   Erythromycin     REACTION: rash   Hydrocodone  Other (See Comments)    Hallucinations   Lisinopril  Cough   Methyldibromoglutaronitrile Dermatitis   Penicillins     REACTION: rash   Bromine Rash    Per dermatologist    Clindamycin/Lincomycin Rash    Past Medical History:  Diagnosis Date   Basal cell carcinoma 2007   BCC   Breast cancer (HCC)    Invasive lobular carcinoma RIGHT breast   Carotid bruit 08/2008   Normal Doppler   DUB (dysfunctional uterine bleeding)    Esophagitis    HTN (hypertension)    Tx started 06/2010   Hyperlipidemia    Hypothyroidism    MHA (microangiopathic hemolytic anemia) (HCC)    Personal history of chemotherapy    2003   Personal history of radiation therapy    2003 right breast   Spinal stenosis     Past Surgical History:  Procedure Laterality Date   BREAST BIOPSY Right    2007 benign   BREAST LUMPECTOMY Right    2003   BTL     CATARACT EXTRACTION, BILATERAL Bilateral 03/26/2016   still blurred vision; Md states this is fine   CERVICAL LAMINECTOMY     Left   FOOT SURGERY     SHOULDER ARTHROSCOPY Right    TUBAL LIGATION  1981    Family History: Family History  Problem Relation Age of Onset   Heart attack Father    Coronary artery disease Father    Diabetes Father    Coronary artery disease Mother    Leukemia Mother    Thyroid  disease Mother    Multiple sclerosis Sister        Twin   Breast cancer Maternal Aunt     Social History:   reports that she has never smoked. She has  never used smokeless tobacco. She reports that she does not drink alcohol and does not use drugs.  Medications: Medications Prior to Admission  Medication Sig Dispense Refill   B Complex-C (B-COMPLEX WITH VITAMIN C) tablet Take 1 tablet by mouth daily.     calcium  carbonate (OS-CAL) 600 MG TABS tablet Take 1 tablet (600 mg total) by mouth 2 (two) times daily with a meal. 180 tablet 3   Cholecalciferol (VITAMIN D3 PO) Take by mouth.     Coenzyme Q10 200 MG TABS Take 200 mg by mouth daily.     CRANBERRY PO Take 2 tablets by mouth at bedtime.     famotidine  (PEPCID ) 40 MG tablet TAKE 1 TABLET BY MOUTH ONCE DAILY AT BEDTIME 90 tablet 0   GLUCOSAMINE PO Take 1 tablet by mouth 2 (two) times daily.     hydrochlorothiazide  (MICROZIDE ) 12.5 MG capsule Take 1 capsule by mouth once daily 90 capsule 3   levothyroxine  (SYNTHROID ) 75 MCG tablet Take 1 tablet (75 mcg total) by mouth daily before breakfast. 90 tablet 3   magnesium  oxide (MAG-OX) 400 MG tablet Take 400 mg by mouth daily.  omeprazole  (PRILOSEC ) 20 MG capsule Take 1 capsule by mouth once daily 90 capsule 3   rosuvastatin  (CRESTOR ) 40 MG tablet Take 1 tablet (40 mg total) by mouth daily. 90 tablet 3   hydrocortisone  2.5 % ointment APPLY OINTMENT EXTERNALLY TWICE DAILY 29 g 0   triamcinolone  cream (KENALOG ) 0.5 % Apply 1 Application topically 2 (two) times daily. 30 g 2    No results found for this or any previous visit (from the past 48 hours).  No results found.    Blood pressure (!) 147/82, pulse 93, temperature 97.9 F (36.6 C), temperature source Temporal, resp. rate 14, height 5' 1 (1.549 m), weight 64.9 kg, SpO2 99%.  General appearance: alert, cooperative, and appears stated age Head: Normocephalic, without obvious abnormality, atraumatic Neck: supple, symmetrical, trachea midline Extremities: Intact sensation and capillary refill all digits.  +epl/fpl/io.  No wounds. Positive tinels, phalens, durkans Skin: Skin color,  texture, turgor normal. No rashes or lesions Neurologic: Grossly normal Incision/Wound: none  Assessment/Plan Right carpal tunnel syndrome.  Non operative and operative treatment options have been discussed with the patient and patient wishes to proceed with operative treatment. We discussed that her nerve conduction studies are not positive for carpal tunnel syndrome, but the injection helped, though briefly.  We can proceed with carpal tunnel release to try to alleviate her symptoms.  We discussed that nocturnal symptoms tend to resolve relatively quickly, numbness and tingling usually improves but does not always fully resolve, and muscle atrophy does not change. Risks, benefits and alternatives of surgery were discussed including risks of blood loss, infection, damage to nerves/vessels/tendons/ligament/bone, failure of surgery, need for additional surgery, complication with wound healing, stiffness, recurrence.  She voiced understanding of these risks and elected to proceed.    [name] 08/08/2023, 1:14 PM

## 2023-08-08 NOTE — Anesthesia Preprocedure Evaluation (Signed)
 Anesthesia Evaluation  Patient identified by MRN, date of birth, ID band  Reviewed: Allergy  & Precautions, NPO status , Patient's Chart, lab work & pertinent test results  Airway Mallampati: II  TM Distance: >3 FB Neck ROM: Full    Dental  (+) Teeth Intact, Dental Advisory Given   Pulmonary    breath sounds clear to auscultation       Cardiovascular hypertension, Pt. on medications  Rhythm:Regular Rate:Normal     Neuro/Psych  Headaches  Neuromuscular disease  negative psych ROS   GI/Hepatic Neg liver ROS,GERD  Medicated,,  Endo/Other  Hypothyroidism    Renal/GU negative Renal ROS     Musculoskeletal  (+) Arthritis ,    Abdominal   Peds  Hematology  (+) Blood dyscrasia, anemia   Anesthesia Other Findings   Reproductive/Obstetrics                             Anesthesia Physical Anesthesia Plan  ASA: 2  Anesthesia Plan: General   Post-op Pain Management: Tylenol  PO (pre-op)* and Toradol IV (intra-op)*   Induction: Intravenous  PONV Risk Score and Plan: 4 or greater and Ondansetron , Dexamethasone , Midazolam  and Scopolamine patch - Pre-op  Airway Management Planned: LMA  Additional Equipment: None  Intra-op Plan:   Post-operative Plan: Extubation in OR  Informed Consent: I have reviewed the patients History and Physical, chart, labs and discussed the procedure including the risks, benefits and alternatives for the proposed anesthesia with the patient or authorized representative who has indicated his/her understanding and acceptance.     Dental advisory given  Plan Discussed with: CRNA  Anesthesia Plan Comments: (Possible MAC with block by surgeon)       Anesthesia Quick Evaluation

## 2023-08-08 NOTE — Anesthesia Postprocedure Evaluation (Signed)
 Anesthesia Post Note  Patient: Patricia Solis  Procedure(s) Performed: CARPAL TUNNEL RELEASE (Right: Hand)     Patient location during evaluation: PACU Anesthesia Type: General Level of consciousness: awake and alert Pain management: pain level controlled Vital Signs Assessment: post-procedure vital signs reviewed and stable Respiratory status: spontaneous breathing, nonlabored ventilation, respiratory function stable and patient connected to nasal cannula oxygen Cardiovascular status: blood pressure returned to baseline and stable Postop Assessment: no apparent nausea or vomiting Anesthetic complications: no  No notable events documented.  Last Vitals:  Vitals:   08/08/23 1442 08/08/23 1500  BP: (!) 132/97 (!) 153/70  Pulse: 77 86  Resp: 13 16  Temp:  (!) 36.2 C  SpO2: 95% 94%    Last Pain:  Vitals:   08/08/23 1500  TempSrc:   PainSc: 0-No pain                 Patricia Solis

## 2023-08-08 NOTE — Op Note (Addendum)
 08/08/2023 [facility]                              OPERATIVE REPORT   PREOPERATIVE DIAGNOSIS:  Right carpal tunnel syndrome.  POSTOPERATIVE DIAGNOSIS:  Right carpal tunnel syndrome.  PROCEDURE:  Right carpal tunnel release.  SURGEON:  Jaque Dacy, MD  ASSISTANT:  none.  ANESTHESIA: General  IV FLUIDS:  Per anesthesia flow sheet.  ESTIMATED BLOOD LOSS:  Minimal.  COMPLICATIONS:  None.  SPECIMENS:  None.  TOURNIQUET TIME:    Total Tourniquet Time Documented: Upper Arm (Right) - 9 minutes Total: Upper Arm (Right) - 9 minutes   DISPOSITION:  Stable to PACU.  LOCATION: [facility]  INDICATIONS:  73 y.o. yo female with numbness and tingling right hand.  Nocturnal symptoms. Short term improvement with injection of carpal tunnel.  She wishes to proceed with right carpal tunnel release.  Risks, benefits and alternatives of surgery were discussed including the risk of blood loss; infection; damage to nerves, vessels, tendons, ligaments, bone; failure of surgery; need for additional surgery; complications with wound healing; continued pain; recurrence of carpal tunnel syndrome; and damage to motor branch. She voiced understanding of these risks and elected to proceed.   OPERATIVE COURSE:  After being identified preoperatively by myself, the patient and I agreed upon the procedure and site of procedure.  The surgical site was marked.  Surgical consent had been signed.  She was given IV Ancef  as preoperative antibiotic prophylaxis.  She was transferred to the operating room and placed on the operating room table in supine position with the Right upper extremity on an armboard.  General anesthesia was induced by the anesthesiologist.  Right upper extremity was prepped and draped in normal sterile orthopaedic fashion.  A surgical pause was performed between the surgeons, anesthesia, and operating room staff, and all were in agreement as to the patient, procedure,  and site of procedure.  Tourniquet at the proximal aspect of the extremity was inflated to 250 mmHg after exsanguination of the arm with an Esmarch bandage  Incision was made over the transverse carpal ligament and carried into the subcutaneous tissues by spreading technique.  Bipolar electrocautery was used to obtain hemostasis.  The palmar fascia was sharply incised.  The transverse carpal ligament was identified.  The fascia distal to the ligament was opened.  Retractor was placed and the flexor tendons were identified.  The flexor tendon to the ring finger was identified and retracted radially.  The transverse carpal ligament was then incised from distal to proximal under direct visualization.  Scissors were used to split the distal aspect of the volar antebrachial fascia.  A finger was placed into the wound to ensure complete decompression, which was the case.  The nerve was examined.  It was flattened and hyperemic.  The motor branch was identified and was intact.  The wound was copiously irrigated with sterile saline.  It was then closed with 4-0 nylon in a horizontal mattress fashion.  It was injected with 0.25% plain Marcaine  to aid in postoperative analgesia.  It was dressed with sterile Xeroform, 4x4s, an ABD, and wrapped with Kerlix and an Ace bandage.  Tourniquet was deflated at 9 minutes.  Fingertips were pink with brisk capillary refill after deflation of the tourniquet.  Operative drapes were broken down.  The patient was awoken from anesthesia safely.  She was transferred back to stretcher and taken to the  PACU in stable condition.  I will see her back in the office in 1 week for postoperative followup.  She wishes to use tylenol  and ibuprofen for pain.  A prescription for tramadol  50 mg po q6 hours prn pain dispense #20 was sent to the pharmacy on file as a back up.    Lauranne Beyersdorf, MD Electronically signed, 08/08/23

## 2023-08-09 ENCOUNTER — Encounter: Payer: Self-pay | Admitting: Adult Health

## 2023-08-09 ENCOUNTER — Encounter (HOSPITAL_BASED_OUTPATIENT_CLINIC_OR_DEPARTMENT_OTHER): Payer: Self-pay | Admitting: Orthopedic Surgery

## 2023-08-09 NOTE — Telephone Encounter (Signed)
**Note De-identified  [place] Obfuscation** Please advise 

## 2023-08-25 ENCOUNTER — Encounter: Payer: Self-pay | Admitting: Adult Health

## 2023-08-25 ENCOUNTER — Ambulatory Visit (INDEPENDENT_AMBULATORY_CARE_PROVIDER_SITE_OTHER): Payer: PPO | Admitting: Adult Health

## 2023-08-25 VITALS — BP 130/60 | HR 95 | Temp 98.4°F | Ht 61.0 in | Wt 145.0 lb

## 2023-08-25 DIAGNOSIS — E876 Hypokalemia: Secondary | ICD-10-CM

## 2023-08-25 DIAGNOSIS — H6993 Unspecified Eustachian tube disorder, bilateral: Secondary | ICD-10-CM

## 2023-08-25 LAB — BASIC METABOLIC PANEL
BUN: 14 mg/dL (ref 6–23)
CO2: 29 meq/L (ref 19–32)
Calcium: 9.3 mg/dL (ref 8.4–10.5)
Chloride: 102 meq/L (ref 96–112)
Creatinine, Ser: 0.65 mg/dL (ref 0.40–1.20)
GFR: 87.91 mL/min (ref >60.00)
Glucose, Bld: 102 mg/dL — ABNORMAL HIGH (ref 70–99)
Potassium: 3.6 meq/L (ref 3.5–5.1)
Sodium: 141 meq/L (ref 135–145)

## 2023-08-25 NOTE — Progress Notes (Signed)
Subjective:    Patient ID: Patricia Solis, female    DOB: Dec 14, 1950, 73 y.o.   MRN: [id number]  HPI 73 year old female who  has a past medical history of Basal cell carcinoma (2007), Breast cancer (HCC), Carotid bruit (08/2008), DUB (dysfunctional uterine bleeding), Esophagitis, HTN (hypertension), Hyperlipidemia, Hypothyroidism, MHA (microangiopathic hemolytic anemia) (HCC), Personal history of chemotherapy, Personal history of radiation therapy, and Spinal stenosis.  She presents to the office today for follow up regarding hypokalemia. She takes hydrochlorothiazide 12.5 mg daily and losartan 50 mg daily. She recently had carpal tunnel surgery and it was noted that her potassium level was 3.4. She has been eating foods higher in potassium and would like to recheck this level   She also reports that she has had the feeling of both ears being full for an unknown amount of time. She has trouble hearing and she feels like she is in a fish bowl. She has no pain or drainage.    Review of Systems See HPI   Past Medical History:  Diagnosis Date   Basal cell carcinoma 2007   BCC   Breast cancer (HCC)    Invasive lobular carcinoma RIGHT breast   Carotid bruit 08/2008   Normal Doppler   DUB (dysfunctional uterine bleeding)    Esophagitis    HTN (hypertension)    Tx started 06/2010   Hyperlipidemia    Hypothyroidism    MHA (microangiopathic hemolytic anemia) (HCC)    Personal history of chemotherapy    2003   Personal history of radiation therapy    2003 right breast   Spinal stenosis     Social History   Socioeconomic History   Marital status: Married    Spouse name: Not on file   Number of children: Not on file   Years of education: Not on file   Highest education level: Not on file  Occupational History   Not on file  Tobacco Use   Smoking status: Never   Smokeless tobacco: Never  Vaping Use   Vaping status: Never Used  Substance and Sexual Activity   Alcohol use:  No   Drug use: No   Sexual activity: Not on file  Other Topics Concern   Not on file  Social History Narrative   Retired in March    Married for 38 years    One daughter, lives in [place]    Has four grandchildren      Likes to play with grand kids, go to [employer] and mountains.       Social Drivers of Corporate investment banker Strain: Low Risk  (03/03/2023)   Overall Financial Resource Strain (CARDIA)    Difficulty of Paying Living Expenses: Not hard at all  Food Insecurity: No Food Insecurity (04/12/2022)   Hunger Vital Sign    Worried About Running Out of Food in the Last Year: Never true    Ran Out of Food in the Last Year: Never true  Transportation Needs: No Transportation Needs (03/03/2023)   PRAPARE - Administrator, Civil Service (Medical): No    Lack of Transportation (Non-Medical): No  Physical Activity: Sufficiently Active (03/03/2023)   Exercise Vital Sign    Days of Exercise per Week: 3 days    Minutes of Exercise per Session: 150+ min  Stress: No Stress Concern Present (03/03/2023)   [employer] of Occupational Health - Occupational Stress Questionnaire    Feeling of Stress : Not at all  Social Connections: Moderately Isolated (03/03/2023)   Social Connection and Isolation Panel [NHANES]    Frequency of Communication with Friends and Family: More than three times a week    Frequency of Social Gatherings with Friends and Family: Once a week    Attends Religious Services: Never    Database administrator or Organizations: No    Attends Banker Meetings: Never    Marital Status: Married  Catering manager Violence: Not At Risk (04/08/2021)   Humiliation, Afraid, Rape, and Kick questionnaire    Fear of Current or Ex-Partner: No    Emotionally Abused: No    Physically Abused: No    Sexually Abused: No    Past Surgical History:  Procedure Laterality Date   BREAST BIOPSY Right    2007 benign   BREAST LUMPECTOMY Right    2003   BTL      CARPAL TUNNEL RELEASE Right 08/08/2023   Procedure: CARPAL TUNNEL RELEASE;  Surgeon: Betha Loa, MD;  Location: [facility];  Service: Orthopedics;  Laterality: Right;   CATARACT EXTRACTION, BILATERAL Bilateral 03/26/2016   still blurred vision; Md states this is fine   CERVICAL LAMINECTOMY     Left   FOOT SURGERY     SHOULDER ARTHROSCOPY Right    TUBAL LIGATION  1981    Family History  Problem Relation Age of Onset   Heart attack Father    Coronary artery disease Father    Diabetes Father    Coronary artery disease Mother    Leukemia Mother    Thyroid disease Mother    Multiple sclerosis Sister        Twin   Breast cancer Maternal Aunt     Allergies  Allergen Reactions   Ambien Cr [Zolpidem] Other (See Comments)    Hallucinations   Cozaar [Losartan] Cough   Erythromycin     REACTION: rash   Hydrocodone Other (See Comments)    Hallucinations   Lisinopril Cough   Methyldibromoglutaronitrile Dermatitis   Penicillins     REACTION: rash   Bromine Rash    Per dermatologist    Clindamycin/Lincomycin Rash    Current Outpatient Medications on File Prior to Visit  Medication Sig Dispense Refill   B Complex-C (B-COMPLEX WITH VITAMIN C) tablet Take 1 tablet by mouth daily.     calcium carbonate (OS-CAL) 600 MG TABS tablet Take 1 tablet (600 mg total) by mouth 2 (two) times daily with a meal. 180 tablet 3   Cholecalciferol (VITAMIN D3 PO) Take by mouth.     Coenzyme Q10 200 MG TABS Take 200 mg by mouth daily.     CRANBERRY PO Take 2 tablets by mouth at bedtime.     famotidine (PEPCID) 40 MG tablet TAKE 1 TABLET BY MOUTH ONCE DAILY AT BEDTIME 90 tablet 0   GLUCOSAMINE PO Take 1 tablet by mouth 2 (two) times daily.     hydrochlorothiazide (MICROZIDE) 12.5 MG capsule Take 1 capsule by mouth once daily 90 capsule 3   hydrocortisone 2.5 % ointment APPLY OINTMENT EXTERNALLY TWICE DAILY 29 g 0   levothyroxine (SYNTHROID) 75 MCG tablet Take 1 tablet (75 mcg total) by  mouth daily before breakfast. 90 tablet 3   magnesium oxide (MAG-OX) 400 MG tablet Take 400 mg by mouth daily.     omeprazole (PRILOSEC) 20 MG capsule Take 1 capsule by mouth once daily 90 capsule 3   rosuvastatin (CRESTOR) 40 MG tablet Take 1 tablet (40 mg total)  by mouth daily. 90 tablet 3   traMADol (ULTRAM) 50 MG tablet Take 1 tablet (50 mg total) by mouth every 6 (six) hours as needed. 1-2 tabs PO q6 hours prn pain 20 tablet 0   triamcinolone cream (KENALOG) 0.5 % Apply 1 Application topically 2 (two) times daily. 30 g 2   No current facility-administered medications on file prior to visit.    There were no vitals taken for this visit.      Objective:   Physical Exam Vitals and nursing note reviewed.  Constitutional:      Appearance: Normal appearance.  HENT:     Right Ear: Ear canal normal. A middle ear effusion is present. There is no impacted cerumen. Tympanic membrane is not erythematous or bulging.     Left Ear: Ear canal normal. A middle ear effusion is present. There is no impacted cerumen. Tympanic membrane is not erythematous or bulging.  Cardiovascular:     Rate and Rhythm: Normal rate and regular rhythm.     Pulses: Normal pulses.     Heart sounds: Normal heart sounds.  Pulmonary:     Effort: Pulmonary effort is normal.     Breath sounds: Normal breath sounds.  Musculoskeletal:        General: Normal range of motion.  Skin:    General: Skin is warm and dry.  Neurological:     General: No focal deficit present.     Mental Status: She is alert and oriented to person, place, and time.  Psychiatric:        Mood and Affect: Mood normal.        Behavior: Behavior normal.        Thought Content: Thought content normal.        Judgment: Judgment normal.        Assessment & Plan:  1. Hypokalemia (Primary)  - Basic Metabolic Panel; Future - Consider ptassium replacement therapy   2. Dysfunction of both eustachian tubes - No signs of infection  - Advised  using Afrin nasal spray for up to three days or using Claritin and/or flonase  Shirline Frees, NP

## 2023-08-26 ENCOUNTER — Encounter: Payer: Self-pay | Admitting: Adult Health

## 2023-09-20 ENCOUNTER — Other Ambulatory Visit: Payer: Self-pay | Admitting: Adult Health

## 2023-09-26 DIAGNOSIS — M25631 Stiffness of right wrist, not elsewhere classified: Secondary | ICD-10-CM | POA: Diagnosis not present

## 2023-09-26 DIAGNOSIS — M79641 Pain in right hand: Secondary | ICD-10-CM | POA: Diagnosis not present

## 2023-10-03 ENCOUNTER — Encounter: Payer: Self-pay | Admitting: Adult Health

## 2023-10-05 DIAGNOSIS — M79641 Pain in right hand: Secondary | ICD-10-CM | POA: Diagnosis not present

## 2023-10-05 DIAGNOSIS — M25631 Stiffness of right wrist, not elsewhere classified: Secondary | ICD-10-CM | POA: Diagnosis not present

## 2023-10-12 DIAGNOSIS — M79641 Pain in right hand: Secondary | ICD-10-CM | POA: Diagnosis not present

## 2023-10-12 DIAGNOSIS — M25631 Stiffness of right wrist, not elsewhere classified: Secondary | ICD-10-CM | POA: Diagnosis not present

## 2023-10-19 DIAGNOSIS — M25631 Stiffness of right wrist, not elsewhere classified: Secondary | ICD-10-CM | POA: Diagnosis not present

## 2023-10-19 DIAGNOSIS — G5603 Carpal tunnel syndrome, bilateral upper limbs: Secondary | ICD-10-CM | POA: Diagnosis not present

## 2023-10-19 DIAGNOSIS — M79641 Pain in right hand: Secondary | ICD-10-CM | POA: Diagnosis not present

## 2023-10-26 DIAGNOSIS — M79641 Pain in right hand: Secondary | ICD-10-CM | POA: Diagnosis not present

## 2023-10-26 DIAGNOSIS — M25631 Stiffness of right wrist, not elsewhere classified: Secondary | ICD-10-CM | POA: Diagnosis not present

## 2023-11-02 DIAGNOSIS — M79641 Pain in right hand: Secondary | ICD-10-CM | POA: Diagnosis not present

## 2023-11-02 DIAGNOSIS — R531 Weakness: Secondary | ICD-10-CM | POA: Diagnosis not present

## 2023-11-02 DIAGNOSIS — M25631 Stiffness of right wrist, not elsewhere classified: Secondary | ICD-10-CM | POA: Diagnosis not present

## 2023-11-28 DIAGNOSIS — M25641 Stiffness of right hand, not elsewhere classified: Secondary | ICD-10-CM | POA: Diagnosis not present

## 2023-11-28 DIAGNOSIS — M1811 Unilateral primary osteoarthritis of first carpometacarpal joint, right hand: Secondary | ICD-10-CM | POA: Diagnosis not present

## 2023-11-28 DIAGNOSIS — R531 Weakness: Secondary | ICD-10-CM | POA: Diagnosis not present

## 2023-11-28 DIAGNOSIS — G5603 Carpal tunnel syndrome, bilateral upper limbs: Secondary | ICD-10-CM | POA: Diagnosis not present

## 2023-11-28 DIAGNOSIS — M79641 Pain in right hand: Secondary | ICD-10-CM | POA: Diagnosis not present

## 2023-11-29 DIAGNOSIS — H52203 Unspecified astigmatism, bilateral: Secondary | ICD-10-CM | POA: Diagnosis not present

## 2023-11-29 DIAGNOSIS — H04123 Dry eye syndrome of bilateral lacrimal glands: Secondary | ICD-10-CM | POA: Diagnosis not present

## 2023-11-29 DIAGNOSIS — H524 Presbyopia: Secondary | ICD-10-CM | POA: Diagnosis not present

## 2023-11-29 DIAGNOSIS — H43813 Vitreous degeneration, bilateral: Secondary | ICD-10-CM | POA: Diagnosis not present

## 2023-11-29 DIAGNOSIS — Z961 Presence of intraocular lens: Secondary | ICD-10-CM | POA: Diagnosis not present

## 2023-12-20 ENCOUNTER — Other Ambulatory Visit: Payer: Self-pay | Admitting: Adult Health

## 2024-01-03 ENCOUNTER — Ambulatory Visit (INDEPENDENT_AMBULATORY_CARE_PROVIDER_SITE_OTHER): Admitting: Adult Health

## 2024-01-03 VITALS — BP 110/70 | HR 74 | Temp 98.3°F | Ht 61.0 in | Wt 144.0 lb

## 2024-01-03 DIAGNOSIS — M2012 Hallux valgus (acquired), left foot: Secondary | ICD-10-CM | POA: Diagnosis not present

## 2024-01-03 NOTE — Progress Notes (Signed)
 Subjective:    Patient ID: Patricia Solis, female    DOB: 21-Jan-1951, 73 y.o.   MRN: [id number]  HPI  73 year old female who presents to the office today for left great toe pain x 2 weeks. Pain is located along the out of of her great toe. Pain worse with wearing tighter shoes.   Review of Systems See HPI   Past Medical History:  Diagnosis Date   Basal cell carcinoma 2007   BCC   Breast cancer (HCC)    Invasive lobular carcinoma RIGHT breast   Carotid bruit 08/2008   Normal Doppler   DUB (dysfunctional uterine bleeding)    Esophagitis    HTN (hypertension)    Tx started 06/2010   Hyperlipidemia    Hypothyroidism    MHA (microangiopathic hemolytic anemia) (HCC)    Personal history of chemotherapy    2003   Personal history of radiation therapy    2003 right breast   Spinal stenosis     Social History   Socioeconomic History   Marital status: Married    Spouse name: Not on file   Number of children: Not on file   Years of education: Not on file   Highest education level: Not on file  Occupational History   Not on file  Tobacco Use   Smoking status: Never   Smokeless tobacco: Never  Vaping Use   Vaping status: Never Used  Substance and Sexual Activity   Alcohol use: No   Drug use: No   Sexual activity: Not on file  Other Topics Concern   Not on file  Social History Narrative   Retired in March    Married for 38 years    One daughter, lives in [place]    Has four grandchildren      Likes to play with grand kids, go to [employer] and mountains.       Social Drivers of Corporate investment banker Strain: Low Risk  (03/03/2023)   Overall Financial Resource Strain (CARDIA)    Difficulty of Paying Living Expenses: Not hard at all  Food Insecurity: Low Risk  (10/19/2023)   Received from [facility]   Hunger Vital Sign    Worried About Running Out of Food in the Last Year: Never true    Ran Out of Food in the Last Year: Never true  Transportation Needs: No  Transportation Needs (10/19/2023)   Received from [facility]    In the past 12 months, has lack of reliable transportation kept you from medical appointments, meetings, work or from getting things needed for daily living? : No  Physical Activity: Sufficiently Active (03/03/2023)   Exercise Vital Sign    Days of Exercise per Week: 3 days    Minutes of Exercise per Session: 150+ min  Stress: No Stress Concern Present (03/03/2023)   [employer] of Occupational Health - Occupational Stress Questionnaire    Feeling of Stress : Not at all  Social Connections: Moderately Isolated (03/03/2023)   Social Connection and Isolation Panel [NHANES]    Frequency of Communication with Friends and Family: More than three times a week    Frequency of Social Gatherings with Friends and Family: Once a week    Attends Religious Services: Never    Database administrator or Organizations: No    Attends Banker Meetings: Never    Marital Status: Married  Catering manager Violence: Not At Risk (04/08/2021)  Humiliation, Afraid, Rape, and Kick questionnaire    Fear of Current or Ex-Partner: No    Emotionally Abused: No    Physically Abused: No    Sexually Abused: No    Past Surgical History:  Procedure Laterality Date   BREAST BIOPSY Right    2007 benign   BREAST LUMPECTOMY Right    2003   BTL     CARPAL TUNNEL RELEASE Right 08/08/2023   Procedure: CARPAL TUNNEL RELEASE;  Surgeon: Brunilda Capra, MD;  Location: [facility];  Service: Orthopedics;  Laterality: Right;   CATARACT EXTRACTION, BILATERAL Bilateral 03/26/2016   still blurred vision; Md states this is fine   CERVICAL LAMINECTOMY     Left   FOOT SURGERY     SHOULDER ARTHROSCOPY Right    TUBAL LIGATION  1981    Family History  Problem Relation Age of Onset   Heart attack Father    Coronary artery disease Father    Diabetes Father    Coronary artery disease Mother    Leukemia Mother    Thyroid   disease Mother    Multiple sclerosis Sister        Twin   Breast cancer Maternal Aunt     Allergies  Allergen Reactions   Ambien Cr [Zolpidem] Other (See Comments)    Hallucinations   Cozaar  [Losartan ] Cough   Erythromycin     REACTION: rash   Hydrocodone  Other (See Comments)    Hallucinations   Lisinopril  Cough   Methyldibromoglutaronitrile Dermatitis   Penicillins     REACTION: rash   Bromine Rash    Per dermatologist    Clindamycin/Lincomycin Rash    Current Outpatient Medications on File Prior to Visit  Medication Sig Dispense Refill   B Complex-C (B-COMPLEX WITH VITAMIN C) tablet Take 1 tablet by mouth daily.     calcium  carbonate (OS-CAL) 600 MG TABS tablet Take 1 tablet (600 mg total) by mouth 2 (two) times daily with a meal. 180 tablet 3   Cholecalciferol (VITAMIN D3 PO) Take by mouth.     Coenzyme Q10 200 MG TABS Take 200 mg by mouth daily.     CRANBERRY PO Take 2 tablets by mouth at bedtime.     famotidine  (PEPCID ) 40 MG tablet TAKE 1 TABLET BY MOUTH ONCE DAILY AT BEDTIME 90 tablet 0   GLUCOSAMINE PO Take 1 tablet by mouth 2 (two) times daily.     hydrochlorothiazide  (MICROZIDE ) 12.5 MG capsule Take 1 capsule by mouth once daily 90 capsule 3   hydrocortisone  2.5 % ointment APPLY OINTMENT EXTERNALLY TWICE DAILY 29 g 0   levothyroxine  (SYNTHROID ) 75 MCG tablet Take 1 tablet (75 mcg total) by mouth daily before breakfast. 90 tablet 3   magnesium  oxide (MAG-OX) 400 MG tablet Take 400 mg by mouth daily.     omeprazole  (PRILOSEC ) 20 MG capsule Take 1 capsule by mouth once daily 90 capsule 3   rosuvastatin  (CRESTOR ) 40 MG tablet Take 1 tablet (40 mg total) by mouth daily. 90 tablet 3   traMADol  (ULTRAM ) 50 MG tablet Take 1 tablet (50 mg total) by mouth every 6 (six) hours as needed. 1-2 tabs PO q6 hours prn pain 20 tablet 0   triamcinolone  cream (KENALOG ) 0.5 % Apply 1 Application topically 2 (two) times daily. 30 g 2   No current facility-administered medications on file  prior to visit.    BP 110/70   Pulse 74   Temp 98.3 F (36.8 C) (Oral)  Ht 5\' 1"  (1.549 m)   Wt 144 lb (65.3 kg)   SpO2 95%   BMI 27.21 kg/m       Objective:   Physical Exam Vitals and nursing note reviewed.  Constitutional:      Appearance: Normal appearance.  Feet:     Left foot:     Skin integrity: No ulcer, skin breakdown or erythema.     Comments: Hallux valgus deformity noted on left great toe  Skin:    General: Skin is warm and dry.  Neurological:     General: No focal deficit present.     Mental Status: She is alert and oriented to person, place, and time.  Psychiatric:        Mood and Affect: Mood normal.        Behavior: Behavior normal.        Thought Content: Thought content normal.        Judgment: Judgment normal.        Assessment & Plan:  1. Hallux valgus of left foot (Primary) - Advised wider toe shoes, NSAIDS PRN, bunion pads.  - She does not want to have surgery at this time  Alto Atta, NP

## 2024-01-23 ENCOUNTER — Other Ambulatory Visit: Payer: Self-pay | Admitting: Obstetrics and Gynecology

## 2024-01-23 DIAGNOSIS — Z1231 Encounter for screening mammogram for malignant neoplasm of breast: Secondary | ICD-10-CM

## 2024-03-06 DIAGNOSIS — M1811 Unilateral primary osteoarthritis of first carpometacarpal joint, right hand: Secondary | ICD-10-CM | POA: Diagnosis not present

## 2024-03-07 ENCOUNTER — Other Ambulatory Visit: Payer: Self-pay | Admitting: Orthopedic Surgery

## 2024-03-12 ENCOUNTER — Ambulatory Visit
Admission: RE | Admit: 2024-03-12 | Discharge: 2024-03-12 | Disposition: A | Discharge status: Home / Self Care | Source: Ambulatory Visit | Attending: Obstetrics and Gynecology | Admitting: Obstetrics and Gynecology

## 2024-03-12 DIAGNOSIS — Z1231 Encounter for screening mammogram for malignant neoplasm of breast: Secondary | ICD-10-CM

## 2024-03-16 ENCOUNTER — Other Ambulatory Visit: Payer: Self-pay | Admitting: Adult Health

## 2024-03-20 DIAGNOSIS — Z124 Encounter for screening for malignant neoplasm of cervix: Secondary | ICD-10-CM | POA: Diagnosis not present

## 2024-03-20 DIAGNOSIS — Z6827 Body mass index (BMI) 27.0-27.9, adult: Secondary | ICD-10-CM | POA: Diagnosis not present

## 2024-03-21 DIAGNOSIS — L814 Other melanin hyperpigmentation: Secondary | ICD-10-CM | POA: Diagnosis not present

## 2024-03-21 DIAGNOSIS — L578 Other skin changes due to chronic exposure to nonionizing radiation: Secondary | ICD-10-CM | POA: Diagnosis not present

## 2024-03-21 DIAGNOSIS — D229 Melanocytic nevi, unspecified: Secondary | ICD-10-CM | POA: Diagnosis not present

## 2024-03-21 DIAGNOSIS — L821 Other seborrheic keratosis: Secondary | ICD-10-CM | POA: Diagnosis not present

## 2024-03-21 DIAGNOSIS — D1801 Hemangioma of skin and subcutaneous tissue: Secondary | ICD-10-CM | POA: Diagnosis not present

## 2024-03-21 DIAGNOSIS — Z85828 Personal history of other malignant neoplasm of skin: Secondary | ICD-10-CM | POA: Diagnosis not present

## 2024-03-23 ENCOUNTER — Encounter: Payer: Self-pay | Admitting: Adult Health

## 2024-03-23 NOTE — Telephone Encounter (Signed)
**Note De-identified  [place] Obfuscation** Please advise 

## 2024-04-03 MED ORDER — COVID-19 MRNA VACC (MODERNA) 50 MCG/0.5ML IM SUSP: 0.5000 mL | Freq: Once | INTRAMUSCULAR | 0 refills | Status: AC

## 2024-04-05 ENCOUNTER — Ambulatory Visit (INDEPENDENT_AMBULATORY_CARE_PROVIDER_SITE_OTHER): Admitting: Family Medicine

## 2024-04-05 DIAGNOSIS — Z0000 Encounter for general adult medical examination without abnormal findings: Secondary | ICD-10-CM | POA: Diagnosis not present

## 2024-04-05 NOTE — Progress Notes (Signed)
 PATIENT CHECK-IN and HEALTH RISK ASSESSMENT QUESTIONNAIRE:  -completed by phone/video for upcoming Medicare Preventive Visit  Pre-Visit Check-in: 1)Vitals (height, wt, BP, etc) - record in vitals section for visit on day of visit Request home vitals (wt, BP, etc.) and enter into vitals, THEN update Vital Signs SmartPhrase below at the top of the HPI. See below.  2)Review and Update Medications, Allergies PMH, Surgeries, Social history in Epic 3)Hospitalizations in the last year with date/reason? n  4)Review and Update Care Team (patient's specialists) in Epic 5) Complete PHQ9 in Epic  6) Complete Fall Screening in Epic 7)Review all Health Maintenance Due and order if not done.  Medicare Wellness Patient Questionnaire:  Answer theses question about your habits: How often do you have a drink containing alcohol?n How many drinks containing alcohol do you have on a typical day when you are drinking?na How often do you have six or more drinks on one occasion?na Have you ever smoked?n Quit date if applicable? na  How many packs a day do/did you smoke? na Do you use smokeless tobacco?n Do you use an illicit drugs?n On average, how many days per week do you engage in moderate to strenuous exercise (like a brisk walk)?4 days per week On average, how many minutes do you engage in exercise at this level?70 minutes, Y, walks, weights, etc  Typical breakfast: yogurt or eggs Typical lunch: sandwich, veggies Typical dinner: varies, meat, veggies Typical snacks:veggies  Beverages: water, coffee  Answer theses question about your everyday activities: Can you perform most household chores?y for the most part Are you deaf or have significant trouble hearing?n Do you feel that you have a problem with memory?some, reports has had evaluation with [facility] for this, unchanged Do you feel safe at home?y Last dentist visit?goes on a regular basis 8. Do you have any difficulty performing your everyday  activities?some due to wrist - but not much Are you having any difficulty walking, taking medications on your own, and or difficulty managing daily home needs?n Do you have difficulty walking or climbing stairs?n Do you have difficulty dressing or bathing?n Do you have difficulty doing errands alone such as visiting a doctor's office or shopping?n Do you currently have any difficulty preparing food and eating?some with cutting due to wrist Do you currently have any difficulty using the toilet?n Do you have any difficulty managing your finances?n Do you have any difficulties with housekeeping of managing your housekeeping?some   Do you have Advanced Directives in place (Living Will, Healthcare Power or Attorney)? y   Last eye Exam and location?Dr. Patrcia   Do you currently use prescribed or non-prescribed narcotic or opioid pain medications?n  Do you have a history or close family history of breast, ovarian, tubal or peritoneal cancer or a family member with BRCA (breast cancer susceptibility 1 and 2) gene mutations? See pmh, FH     ----------------------------------------------------------------------------------------------------------------------------------------------------------------------------------------------------------------------  Because this visit was a virtual/telehealth visit, some criteria may be missing or patient reported. Any vitals not documented were not able to be obtained and vitals that have been documented are patient reported.    MEDICARE ANNUAL PREVENTIVE VISIT WITH PROVIDER: (Welcome to Medicare, initial annual wellness or annual wellness exam)  Virtual Visit via Video Note  I connected with Patricia Solis on 04/05/24 by a video enabled telemedicine application and verified that I am speaking with the correct person using two identifiers.  Location patient: home Location provider:work or home office Persons participating in the virtual visit:  patient,  provider  Concerns and/or follow up today: having wrist surgery later this month.    See HM section in Epic for other details of completed HM.    ROS: negative for report of fevers, unintentional weight loss, vision changes, vision loss, hearing loss or change, chest pain, sob, hemoptysis, melena, hematochezia, hematuria, falls, bleeding or bruising, thoughts of suicide or self harm, memory loss  Patient-completed extensive health risk assessment - reviewed and discussed with the patient: See Health Risk Assessment completed with patient prior to the visit either above or in recent phone note. This was reviewed in detailed with the patient today and appropriate recommendations, orders and referrals were placed as needed per Summary below and patient instructions.   Review of Medical History: -PMH, PSH, Family History and current specialty and care providers reviewed and updated and listed below   Patient Care Team: Merna Huxley, NP as PCP - General (Family Medicine) Liane Sharyne MATSU, [facility] (Inactive) as Pharmacist (Pharmacist)   Past Medical History:  Diagnosis Date   Basal cell carcinoma 2007   BCC   Breast cancer (HCC)    Invasive lobular carcinoma RIGHT breast   Carotid bruit 08/2008   Normal Doppler   DUB (dysfunctional uterine bleeding)    Esophagitis    HTN (hypertension)    Tx started 06/2010   Hyperlipidemia    Hypothyroidism    MHA (microangiopathic hemolytic anemia) (HCC)    Personal history of chemotherapy    2003   Personal history of radiation therapy    2003 right breast   Spinal stenosis     Past Surgical History:  Procedure Laterality Date   BREAST BIOPSY Right    2007 benign   BREAST LUMPECTOMY Right    2003   BTL     CARPAL TUNNEL RELEASE Right 08/08/2023   Procedure: CARPAL TUNNEL RELEASE;  Surgeon: Murrell Drivers, MD;  Location: [facility];  Service: Orthopedics;  Laterality: Right;   CATARACT EXTRACTION, BILATERAL  Bilateral 03/26/2016   still blurred vision; Md states this is fine   CERVICAL LAMINECTOMY     Left   FOOT SURGERY     SHOULDER ARTHROSCOPY Right    TUBAL LIGATION  1981    Social History   Socioeconomic History   Marital status: Married    Spouse name: Not on file   Number of children: Not on file   Years of education: Not on file   Highest education level: 12th grade  Occupational History   Not on file  Tobacco Use   Smoking status: Never   Smokeless tobacco: Never  Vaping Use   Vaping status: Never Used  Substance and Sexual Activity   Alcohol use: No   Drug use: No   Sexual activity: Not on file  Other Topics Concern   Not on file  Social History Narrative   Retired in March    Married for 38 years    One daughter, lives in [place]    Has four grandchildren      Likes to play with grand kids, go to [employer] and mountains.       Social Drivers of Corporate investment banker Strain: Low Risk  (04/05/2024)   Overall Financial Resource Strain (CARDIA)    Difficulty of Paying Living Expenses: Not hard at all  Food Insecurity: No Food Insecurity (04/05/2024)   Hunger Vital Sign    Worried About Running Out of Food in the Last Year: Never true  Ran Out of Food in the Last Year: Never true  Transportation Needs: No Transportation Needs (04/05/2024)   PRAPARE - Administrator, Civil Service (Medical): No    Lack of Transportation (Non-Medical): No  Physical Activity: Sufficiently Active (04/05/2024)   Exercise Vital Sign    Days of Exercise per Week: 4 days    Minutes of Exercise per Session: 70 min  Stress: No Stress Concern Present (04/05/2024)   [employer] of Occupational Health - Occupational Stress Questionnaire    Feeling of Stress: Not at all  Social Connections: Moderately Integrated (04/05/2024)   Social Connection and Isolation Panel    Frequency of Communication with Friends and Family: Three times a week    Frequency of Social Gatherings  with Friends and Family: Twice a week    Attends Religious Services: 1 to 4 times per year    Active Member of [employer] or Organizations: No    Attends Engineer, structural: Not on file    Marital Status: Married  Catering manager Violence: Not At Risk (04/08/2021)   Humiliation, Afraid, Rape, and Kick questionnaire    Fear of Current or Ex-Partner: No    Emotionally Abused: No    Physically Abused: No    Sexually Abused: No    Family History  Problem Relation Age of Onset   Heart attack Father    Coronary artery disease Father    Diabetes Father    Coronary artery disease Mother    Leukemia Mother    Thyroid  disease Mother    Multiple sclerosis Sister        Twin   Breast cancer Maternal Aunt     Current Outpatient Medications on File Prior to Visit  Medication Sig Dispense Refill   B Complex-C (B-COMPLEX WITH VITAMIN C) tablet Take 1 tablet by mouth daily.     calcium  carbonate (OS-CAL) 600 MG TABS tablet Take 1 tablet (600 mg total) by mouth 2 (two) times daily with a meal. 180 tablet 3   Cholecalciferol (VITAMIN D3 PO) Take by mouth.     Coenzyme Q10 200 MG TABS Take 200 mg by mouth daily.     CRANBERRY PO Take 2 tablets by mouth at bedtime.     famotidine  (PEPCID ) 40 MG tablet TAKE 1 TABLET BY MOUTH ONCE DAILY AT BEDTIME 90 tablet 0   GLUCOSAMINE PO Take 1 tablet by mouth 2 (two) times daily.     hydrochlorothiazide  (MICROZIDE ) 12.5 MG capsule Take 1 capsule by mouth once daily 90 capsule 3   hydrocortisone  2.5 % ointment APPLY OINTMENT EXTERNALLY TWICE DAILY 29 g 0   levothyroxine  (SYNTHROID ) 75 MCG tablet Take 1 tablet (75 mcg total) by mouth daily before breakfast. 90 tablet 3   magnesium  oxide (MAG-OX) 400 MG tablet Take 400 mg by mouth daily.     omeprazole  (PRILOSEC ) 20 MG capsule Take 1 capsule by mouth once daily 90 capsule 3   rosuvastatin  (CRESTOR ) 40 MG tablet Take 1 tablet (40 mg total) by mouth daily. 90 tablet 3   traMADol  (ULTRAM ) 50 MG tablet Take 1  tablet (50 mg total) by mouth every 6 (six) hours as needed. 1-2 tabs PO q6 hours prn pain 20 tablet 0   triamcinolone  cream (KENALOG ) 0.5 % Apply 1 Application topically 2 (two) times daily. 30 g 2   No current facility-administered medications on file prior to visit.    Allergies  Allergen Reactions   Ambien Cr [Zolpidem] Other (See Comments)  Hallucinations   Cozaar  [Losartan ] Cough   Erythromycin     REACTION: rash   Hydrocodone  Other (See Comments)    Hallucinations   Lisinopril  Cough   Methyldibromoglutaronitrile Dermatitis   Penicillins     REACTION: rash   Bromine Rash    Per dermatologist    Clindamycin/Lincomycin Rash       Physical Exam Vitals requested from patient and listed below if patient had equipment and was able to obtain at home for this virtual visit: There were no vitals filed for this visit. Estimated body mass index is 27.21 kg/m as calculated from the following:   Height as of 01/03/24: 5' 1 (1.549 m).   Weight as of 01/03/24: 144 lb (65.3 kg).  EKG (optional): deferred due to virtual visit  GENERAL: alert, oriented, no acute distress detected, full vision exam deferred due to pandemic and/or virtual encounter  HEENT: atraumatic, conjunttiva clear, no obvious abnormalities on inspection of external nose and ears  NECK: normal movements of the head and neck  LUNGS: on inspection no signs of respiratory distress, breathing rate appears normal, no obvious gross SOB, gasping or wheezing  CV: no obvious cyanosis  MS: moves all visible extremities without noticeable abnormality  PSYCH/NEURO: pleasant and cooperative, no obvious depression or anxiety, speech and thought processing grossly intact, Cognitive function grossly intact  Flowsheet Row Office Visit from 05/31/2023 in [facility] [place]  PHQ-9 Total Score 0        04/05/2024    3:46 PM 05/31/2023    8:55 AM 03/03/2023    2:46 PM 04/12/2022    1:33 PM 09/24/2021     1:47 PM  Depression screen PHQ 2/9  Decreased Interest 0 0 0 0 0  Down, Depressed, Hopeless 0 0 0 0 0  PHQ - 2 Score 0 0 0 0 0  Altered sleeping  0 0    Tired, decreased energy  0 0    Change in appetite  0 0    Feeling bad or failure about yourself   0 0    Trouble concentrating  0 0    Moving slowly or fidgety/restless  0 0    Suicidal thoughts  0 0    PHQ-9 Score  0 0    Difficult doing work/chores  Not difficult at all          04/12/2022    1:32 PM 03/03/2023    2:42 PM 05/31/2023    8:55 AM 04/05/2024    2:46 PM 04/05/2024    3:45 PM  Fall Risk  Falls in the past year? 1 0 0 0 0  Was there an injury with Fall? 1 0 0  0  Fall Risk Category Calculator 2 0 0  0  Fall Risk Category (Retired) Moderate       (RETIRED) Patient Fall Risk Level Low fall risk       Patient at Risk for Falls Due to Medication side effect No Fall Risks No Fall Risks    Fall risk Follow up Falls evaluation completed;Education provided;Falls prevention discussed  Falls evaluation completed Falls evaluation completed  Falls evaluation completed     Data saved with a previous flowsheet row definition     SUMMARY AND PLAN:  Encounter for Medicare annual wellness exam   Discussed applicable health maintenance/preventive health measures and advised and referred or ordered per patient preferences: -she had flu and covid vaccines at pharmacy - updated in epic -she has bone  density scheduled for Nov Health Maintenance  Topic Date Due   Diabetic kidney evaluation - Urine ACR  05/28/2027 (Originally 02/23/1969)   Diabetic kidney evaluation - eGFR measurement  08/24/2024   COVID-19 Vaccine (8 - Pfizer risk 2024-25 season) 10/02/2024   Medicare Annual Wellness (AWV)  04/05/2025   DTaP/Tdap/Td (4 - Td or Tdap) 05/28/2032   Colonoscopy  11/03/2032   Pneumococcal Vaccine: 50+ Years  Completed   Influenza Vaccine  Completed   DEXA SCAN  Completed   Hepatitis C Screening  Completed   Zoster Vaccines- Shingrix   Completed   HPV VACCINES  Aged Out   Meningococcal B Vaccine  Aged Out   FOOT EXAM  Discontinued   Mammogram  Discontinued   HEMOGLOBIN A1C  Discontinued   OPHTHALMOLOGY EXAM  Discontinued      Education and counseling on the following was provided based on the above review of health and a plan/checklist for the patient, along with additional information discussed, was provided for the patient in the patient instructions :   -Advised and counseled on a healthy lifestyle - including the importance of a healthy diet, regular physical activity -Reviewed patient's current diet. Advised and counseled on a whole foods based healthy diet. A summary of a healthy diet was provided in the Patient Instructions.  -reviewed patient's current physical activity level and discussed exercise guidelines for adults. Congratulated on healthy habits. Encouraged to continue.  -Advise yearly dental visits at minimum and regular eye exams   Follow up: see patient instructions     Patient Instructions  I really enjoyed getting to talk with you today! I am available on Tuesdays and Thursdays for virtual visits if you have any questions or concerns, or if I can be of any further assistance.   CHECKLIST FROM ANNUAL WELLNESS VISIT:  -Follow up (please call to schedule if not scheduled after visit):   -yearly for annual wellness visit with primary care office  Here is a list of your preventive care/health maintenance measures and the plan for each if any are due:  PLAN For any measures below that may be due:   Health Maintenance  Topic Date Due   Influenza Vaccine  02/24/2024   Medicare Annual Wellness (AWV)  03/02/2024   COVID-19 Vaccine (8 - Pfizer risk 2024-25 season) 03/26/2024   Diabetic kidney evaluation - Urine ACR  05/28/2027 (Originally 02/23/1969)   Diabetic kidney evaluation - eGFR measurement  08/24/2024   DTaP/Tdap/Td (4 - Td or Tdap) 05/28/2032   Colonoscopy  11/03/2032   Pneumococcal  Vaccine: 50+ Years  Completed   DEXA SCAN  Completed   Hepatitis C Screening  Completed   Zoster Vaccines- Shingrix  Completed   HPV VACCINES  Aged Out   Meningococcal B Vaccine  Aged Out   FOOT EXAM  Discontinued   Mammogram  Discontinued   HEMOGLOBIN A1C  Discontinued   OPHTHALMOLOGY EXAM  Discontinued    -See a dentist at least yearly  -Get your eyes checked and then per your eye specialist's recommendations  -Other issues addressed today:   -I have included below further information regarding a healthy whole foods based diet, physical activity guidelines for adults, stress management and opportunities for social connections. I hope you find this information useful.   -----------------------------------------------------------------------------------------------------------------------------------------------------------------------------------------------------------------------------------------------------------    NUTRITION: -eat real food: lots of colorful vegetables (half the plate) and fruits -5-7 servings of vegetables and fruits per day (fresh or steamed is best), exp. 2 servings of vegetables with lunch  and dinner and 2 servings of fruit per day. Berries and greens such as kale and collards are great choices.  -consume on a regular basis:  fresh fruits, fresh veggies, fish, nuts, seeds, healthy oils (such as olive oil, avocado oil), whole grains (make sure for bread/pasta/crackers/etc., that the first ingredient on label contains the word whole), legumes. -can eat small amounts of dairy and lean meat (no larger than the palm of your hand), but avoid processed meats such as ham, bacon, lunch meat, etc. -drink water -try to avoid fast food and pre-packaged foods, processed meat, ultra processed foods/beverages (donuts, candy, etc.) -most experts advise limiting sodium to < 2300mg  per day, should limit further is any chronic conditions such as high blood pressure, heart  disease, diabetes, etc. The American Heart Association advised that < 1500mg  is is ideal -try to avoid foods/beverages that contain any ingredients with names you do not recognize  -try to avoid foods/beverages  with added sugar or sweeteners/sweets  -try to avoid sweet drinks (including diet drinks): soda, juice, Gatorade, sweet tea, power drinks, diet drinks -try to avoid white rice, white bread, pasta (unless whole grain)  EXERCISE GUIDELINES FOR ADULTS: -if you wish to increase your physical activity, do so gradually and with the approval of your doctor -STOP and seek medical care immediately if you have any chest pain, chest discomfort or trouble breathing when starting or increasing exercise  -move and stretch your body, legs, feet and arms when sitting for long periods -Physical activity guidelines for optimal health in adults: -get at least 150 minutes per week of moderate exercise (can talk, but not sing); this is about 20-30 minutes of sustained activity 5-7 days per week or two 10-15 minute episodes of sustained activity 5-7 days per week -do some muscle building/resistance training/strength training at least 2 days per week  -balance exercises 3+ days per week:   Stand somewhere where you have something sturdy to hold onto if you lose balance    1) lift up on toes, then back down, start with 5x per day and work up to 20x   2) stand and lift one leg straight out to the side so that foot is a few inches of the floor, start with 5x each side and work up to 20x each side   3) stand on one foot, start with 5 seconds each side and work up to 20 seconds on each side  If you need ideas or help with getting more active:  -Silver sneakers https://tools.silversneakers.com  -Walk with a Doc: http://www.duncan-williams.com/  -try to include resistance (weight lifting/strength building) and balance exercises twice per week: or the following link for  ideas: http://castillo-powell.com/  BuyDucts.dk  STRESS MANAGEMENT: -can try meditating, or just sitting quietly with deep breathing while intentionally relaxing all parts of your body for 5 minutes daily -if you need further help with stress, anxiety or depression please follow up with your primary doctor or contact the wonderful folks at [facility]: [phone]  SOCIAL CONNECTIONS: -options in [place] if you wish to engage in more social and exercise related activities:  -Silver sneakers https://tools.silversneakers.com  -Walk with a Doc: http://www.duncan-williams.com/  -Check out the [facility] Active Adults 50+ section on the [place] of [employer] (hiking clubs, book clubs, cards and games, chess, exercise classes, aquatic classes and much more) - see the website for details: https://www.[place].gov/departments/parks-recreation/active-adults50  -YouTube has lots of exercise videos for different ages and abilities as well  -[facility] (a variety  of indoor and outdoor inperson activities for adults). [phone]. [address]  -Virtual Online Classes (a variety of topics): see seniorplanet.org or call [phone]  -consider volunteering at a school, hospice center, church, senior center or elsewhere            Chiquita JONELLE Cramp, DO

## 2024-04-05 NOTE — Patient Instructions (Signed)
 I really enjoyed getting to talk with you today! I am available on Tuesdays and Thursdays for virtual visits if you have any questions or concerns, or if I can be of any further assistance.   CHECKLIST FROM ANNUAL WELLNESS VISIT:  -Follow up (please call to schedule if not scheduled after visit):   -yearly for annual wellness visit with primary care office  Here is a list of your preventive care/health maintenance measures and the plan for each if any are due:  PLAN For any measures below that may be due:   Health Maintenance  Topic Date Due   Influenza Vaccine  02/24/2024   Medicare Annual Wellness (AWV)  03/02/2024   COVID-19 Vaccine (8 - Pfizer risk 2024-25 season) 03/26/2024   Diabetic kidney evaluation - Urine ACR  05/28/2027 (Originally 02/23/1969)   Diabetic kidney evaluation - eGFR measurement  08/24/2024   DTaP/Tdap/Td (4 - Td or Tdap) 05/28/2032   Colonoscopy  11/03/2032   Pneumococcal Vaccine: 50+ Years  Completed   DEXA SCAN  Completed   Hepatitis C Screening  Completed   Zoster Vaccines- Shingrix  Completed   HPV VACCINES  Aged Out   Meningococcal B Vaccine  Aged Out   FOOT EXAM  Discontinued   Mammogram  Discontinued   HEMOGLOBIN A1C  Discontinued   OPHTHALMOLOGY EXAM  Discontinued    -See a dentist at least yearly  -Get your eyes checked and then per your eye specialist's recommendations  -Other issues addressed today:   -I have included below further information regarding a healthy whole foods based diet, physical activity guidelines for adults, stress management and opportunities for social connections. I hope you find this information useful.   -----------------------------------------------------------------------------------------------------------------------------------------------------------------------------------------------------------------------------------------------------------    NUTRITION: -eat real food: lots of colorful vegetables (half  the plate) and fruits -5-7 servings of vegetables and fruits per day (fresh or steamed is best), exp. 2 servings of vegetables with lunch and dinner and 2 servings of fruit per day. Berries and greens such as kale and collards are great choices.  -consume on a regular basis:  fresh fruits, fresh veggies, fish, nuts, seeds, healthy oils (such as olive oil, avocado oil), whole grains (make sure for bread/pasta/crackers/etc., that the first ingredient on label contains the word whole), legumes. -can eat small amounts of dairy and lean meat (no larger than the palm of your hand), but avoid processed meats such as ham, bacon, lunch meat, etc. -drink water -try to avoid fast food and pre-packaged foods, processed meat, ultra processed foods/beverages (donuts, candy, etc.) -most experts advise limiting sodium to < 2300mg  per day, should limit further is any chronic conditions such as high blood pressure, heart disease, diabetes, etc. The American Heart Association advised that < 1500mg  is is ideal -try to avoid foods/beverages that contain any ingredients with names you do not recognize  -try to avoid foods/beverages  with added sugar or sweeteners/sweets  -try to avoid sweet drinks (including diet drinks): soda, juice, Gatorade, sweet tea, power drinks, diet drinks -try to avoid white rice, white bread, pasta (unless whole grain)  EXERCISE GUIDELINES FOR ADULTS: -if you wish to increase your physical activity, do so gradually and with the approval of your doctor -STOP and seek medical care immediately if you have any chest pain, chest discomfort or trouble breathing when starting or increasing exercise  -move and stretch your body, legs, feet and arms when sitting for long periods -Physical activity guidelines for optimal health in adults: -get at least 150 minutes per  week of moderate exercise (can talk, but not sing); this is about 20-30 minutes of sustained activity 5-7 days per week or two 10-15  minute episodes of sustained activity 5-7 days per week -do some muscle building/resistance training/strength training at least 2 days per week  -balance exercises 3+ days per week:   Stand somewhere where you have something sturdy to hold onto if you lose balance    1) lift up on toes, then back down, start with 5x per day and work up to 20x   2) stand and lift one leg straight out to the side so that foot is a few inches of the floor, start with 5x each side and work up to 20x each side   3) stand on one foot, start with 5 seconds each side and work up to 20 seconds on each side  If you need ideas or help with getting more active:  -Silver sneakers https://tools.silversneakers.com  -Walk with a Doc: http://www.duncan-williams.com/  -try to include resistance (weight lifting/strength building) and balance exercises twice per week: or the following link for ideas: http://castillo-powell.com/  BuyDucts.dk  STRESS MANAGEMENT: -can try meditating, or just sitting quietly with deep breathing while intentionally relaxing all parts of your body for 5 minutes daily -if you need further help with stress, anxiety or depression please follow up with your primary doctor or contact the wonderful folks at [facility]: [phone]  SOCIAL CONNECTIONS: -options in [place] if you wish to engage in more social and exercise related activities:  -Silver sneakers https://tools.silversneakers.com  -Walk with a Doc: http://www.duncan-williams.com/  -Check out the [facility] Active Adults 50+ section on the [employer] (hiking clubs, book clubs, cards and games, chess, exercise classes, aquatic classes and much more) - see the website for details: https://www.[place].gov/departments/parks-recreation/active-adults50  -YouTube has lots of exercise videos for different ages and abilities as  well  -[facility] (a variety of indoor and outdoor inperson activities for adults). ([phone]. [address]  -Virtual Online Classes (a variety of topics): see seniorplanet.org or call [phone]  -consider volunteering at a school, hospice center, church, senior center or elsewhere

## 2024-04-12 ENCOUNTER — Encounter (HOSPITAL_BASED_OUTPATIENT_CLINIC_OR_DEPARTMENT_OTHER)
Admission: RE | Admit: 2024-04-12 | Discharge: 2024-04-12 | Disposition: A | Discharge status: Home / Self Care | Source: Ambulatory Visit | Attending: Orthopedic Surgery | Admitting: Orthopedic Surgery

## 2024-04-12 ENCOUNTER — Encounter (HOSPITAL_BASED_OUTPATIENT_CLINIC_OR_DEPARTMENT_OTHER): Payer: Self-pay | Admitting: Orthopedic Surgery

## 2024-04-12 ENCOUNTER — Other Ambulatory Visit: Payer: Self-pay

## 2024-04-12 DIAGNOSIS — Z01812 Encounter for preprocedural laboratory examination: Secondary | ICD-10-CM | POA: Insufficient documentation

## 2024-04-12 LAB — BASIC METABOLIC PANEL WITH GFR
Anion gap: 11 (ref 5–15)
BUN: 14 mg/dL (ref 8–23)
CO2: 26 mmol/L (ref 22–32)
Calcium: 9.4 mg/dL (ref 8.9–10.3)
Chloride: 103 mmol/L (ref 98–111)
Creatinine, Ser: 0.66 mg/dL (ref 0.44–1.00)
GFR, Estimated: >60 mL/min (ref >60)
Glucose, Bld: 111 mg/dL — ABNORMAL HIGH (ref 70–99)
Potassium: 3.5 mmol/L (ref 3.5–5.1)
Sodium: 140 mmol/L (ref 135–145)

## 2024-04-12 NOTE — Progress Notes (Signed)

## 2024-04-19 ENCOUNTER — Encounter (HOSPITAL_BASED_OUTPATIENT_CLINIC_OR_DEPARTMENT_OTHER): Payer: Self-pay | Admitting: Orthopedic Surgery

## 2024-04-19 ENCOUNTER — Encounter (HOSPITAL_BASED_OUTPATIENT_CLINIC_OR_DEPARTMENT_OTHER)
Admission: RE | Disposition: A | Discharge status: Home / Self Care | Payer: Self-pay | Source: Home / Self Care | Attending: Orthopedic Surgery

## 2024-04-19 ENCOUNTER — Ambulatory Visit (HOSPITAL_BASED_OUTPATIENT_CLINIC_OR_DEPARTMENT_OTHER)

## 2024-04-19 ENCOUNTER — Other Ambulatory Visit: Payer: Self-pay

## 2024-04-19 ENCOUNTER — Ambulatory Visit (HOSPITAL_BASED_OUTPATIENT_CLINIC_OR_DEPARTMENT_OTHER): Admitting: Anesthesiology

## 2024-04-19 ENCOUNTER — Ambulatory Visit (HOSPITAL_BASED_OUTPATIENT_CLINIC_OR_DEPARTMENT_OTHER)
Admission: RE | Admit: 2024-04-19 | Discharge: 2024-04-19 | Disposition: A | Discharge status: Home / Self Care | Attending: Orthopedic Surgery | Admitting: Orthopedic Surgery

## 2024-04-19 DIAGNOSIS — I10 Essential (primary) hypertension: Secondary | ICD-10-CM | POA: Insufficient documentation

## 2024-04-19 DIAGNOSIS — M1811 Unilateral primary osteoarthritis of first carpometacarpal joint, right hand: Secondary | ICD-10-CM

## 2024-04-19 DIAGNOSIS — R519 Headache, unspecified: Secondary | ICD-10-CM | POA: Insufficient documentation

## 2024-04-19 DIAGNOSIS — Z79899 Other long term (current) drug therapy: Secondary | ICD-10-CM | POA: Diagnosis not present

## 2024-04-19 DIAGNOSIS — Z79890 Hormone replacement therapy (postmenopausal): Secondary | ICD-10-CM | POA: Diagnosis not present

## 2024-04-19 DIAGNOSIS — K219 Gastro-esophageal reflux disease without esophagitis: Secondary | ICD-10-CM | POA: Insufficient documentation

## 2024-04-19 DIAGNOSIS — Z01818 Encounter for other preprocedural examination: Secondary | ICD-10-CM

## 2024-04-19 DIAGNOSIS — D649 Anemia, unspecified: Secondary | ICD-10-CM | POA: Insufficient documentation

## 2024-04-19 DIAGNOSIS — E039 Hypothyroidism, unspecified: Secondary | ICD-10-CM | POA: Insufficient documentation

## 2024-04-19 HISTORY — DX: Gastro-esophageal reflux disease without esophagitis: K21.9

## 2024-04-19 HISTORY — PX: CARPOMETACARPEL SUSPENSION PLASTY: SHX5005

## 2024-04-19 SURGERY — CARPOMETACARPEL (CMC) SUSPENSION PLASTY
Anesthesia: General | Site: Thumb | Laterality: Right

## 2024-04-19 MED ORDER — HYDROMORPHONE HCL 1 MG/ML IJ SOLN
INTRAMUSCULAR | Status: AC
  Filled 2024-04-19: qty 0.5

## 2024-04-19 MED ORDER — PROPOFOL 10 MG/ML IV BOLUS
INTRAVENOUS | Status: DC | PRN
  Administered 2024-04-19: 110 mg via INTRAVENOUS

## 2024-04-19 MED ORDER — OXYCODONE HCL 5 MG/5ML PO SOLN: 5.0000 mg | Freq: Once | ORAL | Status: AC | PRN

## 2024-04-19 MED ORDER — FENTANYL CITRATE (PF) 100 MCG/2ML IJ SOLN
INTRAMUSCULAR | Status: AC
  Filled 2024-04-19: qty 2

## 2024-04-19 MED ORDER — LIDOCAINE 2% (20 MG/ML) 5 ML SYRINGE
INTRAMUSCULAR | Status: DC | PRN
  Administered 2024-04-19: 60 mg via INTRAVENOUS

## 2024-04-19 MED ORDER — LIDOCAINE 2% (20 MG/ML) 5 ML SYRINGE
INTRAMUSCULAR | Status: AC
  Filled 2024-04-19: qty 5

## 2024-04-19 MED ORDER — FENTANYL CITRATE (PF) 100 MCG/2ML IJ SOLN
INTRAMUSCULAR | Status: DC | PRN
  Administered 2024-04-19 (×2): 25 ug via INTRAVENOUS

## 2024-04-19 MED ORDER — OXYCODONE HCL 5 MG PO TABS
ORAL_TABLET | ORAL | Status: AC
  Filled 2024-04-19: qty 1

## 2024-04-19 MED ORDER — DEXAMETHASONE SODIUM PHOSPHATE 10 MG/ML IJ SOLN
INTRAMUSCULAR | Status: DC | PRN
  Administered 2024-04-19: 5 mg via INTRAVENOUS

## 2024-04-19 MED ORDER — DEXAMETHASONE SODIUM PHOSPHATE 10 MG/ML IJ SOLN
INTRAMUSCULAR | Status: AC
  Filled 2024-04-19: qty 1

## 2024-04-19 MED ORDER — 0.9 % SODIUM CHLORIDE (POUR BTL) OPTIME
TOPICAL | Status: DC | PRN
  Administered 2024-04-19: 200 mL

## 2024-04-19 MED ORDER — ONDANSETRON HCL 4 MG/2ML IJ SOLN
INTRAMUSCULAR | Status: DC | PRN
  Administered 2024-04-19: 4 mg via INTRAVENOUS

## 2024-04-19 MED ORDER — ACETAMINOPHEN 500 MG PO TABS: 1000.0000 mg | ORAL_TABLET | Freq: Once | ORAL | Status: DC

## 2024-04-19 MED ORDER — TRAMADOL HCL 50 MG PO TABS: ORAL_TABLET | ORAL | 0 refills | Status: DC

## 2024-04-19 MED ORDER — CEFAZOLIN SODIUM-DEXTROSE 2-4 GM/100ML-% IV SOLN
INTRAVENOUS | Status: AC
  Filled 2024-04-19: qty 100

## 2024-04-19 MED ORDER — CEFAZOLIN SODIUM-DEXTROSE 2-4 GM/100ML-% IV SOLN
2.0000 g | INTRAVENOUS | Status: AC
  Administered 2024-04-19: 2 g via INTRAVENOUS

## 2024-04-19 MED ORDER — HYDROMORPHONE HCL 1 MG/ML IJ SOLN
0.2500 mg | INTRAMUSCULAR | Status: DC | PRN
  Administered 2024-04-19 (×2): 0.25 mg via INTRAVENOUS

## 2024-04-19 MED ORDER — EPHEDRINE 5 MG/ML INJ
INTRAVENOUS | Status: AC
  Filled 2024-04-19: qty 5

## 2024-04-19 MED ORDER — MIDAZOLAM HCL 2 MG/2ML IJ SOLN
INTRAMUSCULAR | Status: AC
  Filled 2024-04-19: qty 2

## 2024-04-19 MED ORDER — OXYCODONE HCL 5 MG PO TABS
5.0000 mg | ORAL_TABLET | Freq: Once | ORAL | Status: AC | PRN
  Administered 2024-04-19: 5 mg via ORAL

## 2024-04-19 MED ORDER — DROPERIDOL 2.5 MG/ML IJ SOLN: 0.6250 mg | Freq: Once | INTRAMUSCULAR | Status: DC | PRN

## 2024-04-19 MED ORDER — ONDANSETRON HCL 4 MG/2ML IJ SOLN
INTRAMUSCULAR | Status: AC
  Filled 2024-04-19: qty 2

## 2024-04-19 MED ORDER — LACTATED RINGERS IV SOLN: INTRAVENOUS | Status: DC

## 2024-04-19 MED ORDER — PROPOFOL 10 MG/ML IV BOLUS
INTRAVENOUS | Status: AC
  Filled 2024-04-19: qty 20

## 2024-04-19 MED ORDER — BUPIVACAINE HCL (PF) 0.25 % IJ SOLN
INTRAMUSCULAR | Status: DC | PRN
  Administered 2024-04-19: 8 mL

## 2024-04-19 SURGICAL SUPPLY — 48 items
BLADE MINI RND TIP GREEN BEAV (BLADE) ×1 IMPLANT
BLADE SURG 15 STRL LF DISP TIS (BLADE) ×2 IMPLANT
BNDG COMPR ESMARK 4X3 LF (GAUZE/BANDAGES/DRESSINGS) ×1 IMPLANT
BNDG ELASTIC 2INX 5YD STR LF (GAUZE/BANDAGES/DRESSINGS) IMPLANT
BNDG ELASTIC 3INX 5YD STR LF (GAUZE/BANDAGES/DRESSINGS) IMPLANT
BNDG GAUZE DERMACEA FLUFF 4 (GAUZE/BANDAGES/DRESSINGS) ×1 IMPLANT
CHLORAPREP W/TINT 26 (MISCELLANEOUS) ×1 IMPLANT
CORD BIPOLAR FORCEPS 12FT (ELECTRODE) ×1 IMPLANT
COVER BACK TABLE 60X90IN (DRAPES) ×1 IMPLANT
COVER MAYO STAND STRL (DRAPES) ×1 IMPLANT
CUFF TOURN SGL QUICK 18X4 (TOURNIQUET CUFF) ×1 IMPLANT
DRAPE EXTREMITY T 121X128X90 (DISPOSABLE) ×1 IMPLANT
DRAPE OEC MINIVIEW 54X84 (DRAPES) ×1 IMPLANT
DRAPE SURG 17X23 STRL (DRAPES) ×1 IMPLANT
GAUZE SPONGE 4X4 12PLY STRL (GAUZE/BANDAGES/DRESSINGS) ×1 IMPLANT
GAUZE XEROFORM 1X8 LF (GAUZE/BANDAGES/DRESSINGS) ×1 IMPLANT
GLOVE BIO SURGEON STRL SZ7.5 (GLOVE) ×1 IMPLANT
GLOVE BIOGEL PI IND STRL 8 (GLOVE) ×1 IMPLANT
GLOVE BIOGEL PI IND STRL 8.5 (GLOVE) IMPLANT
GLOVE SURG ORTHO 8.0 STRL STRW (GLOVE) IMPLANT
GOWN STRL REUS W/ TWL LRG LVL3 (GOWN DISPOSABLE) ×1 IMPLANT
GOWN STRL REUS W/TWL XL LVL3 (GOWN DISPOSABLE) ×1 IMPLANT
KIT BUTTON SUT MICROLINK LP (Anchor) IMPLANT
NDL HYPO 25X1 1.5 SAFETY (NEEDLE) IMPLANT
NDL KEITH (NEEDLE) IMPLANT
NDL SAFETY ECLIPSE 18X1.5 (NEEDLE) IMPLANT
NEEDLE HYPO 25X1 1.5 SAFETY (NEEDLE) ×1 IMPLANT
NEEDLE KEITH (NEEDLE) IMPLANT
PACK BASIN DAY SURGERY FS (CUSTOM PROCEDURE TRAY) ×1 IMPLANT
PAD CAST 3X4 CTTN HI CHSV (CAST SUPPLIES) ×1 IMPLANT
PAD CAST 4YDX4 CTTN HI CHSV (CAST SUPPLIES) IMPLANT
PADDING CAST ABS COTTON 4X4 ST (CAST SUPPLIES) ×1 IMPLANT
SLEEVE SCD COMPRESS KNEE MED (STOCKING) IMPLANT
SPLINT PLASTER CAST FAST 5X30 (CAST SUPPLIES) IMPLANT
SPLINT PLASTER CAST XFAST 3X15 (CAST SUPPLIES) IMPLANT
SPLINT PLASTER CAST XFAST 4X15 (CAST SUPPLIES) IMPLANT
STOCKINETTE 4X48 STRL (DRAPES) ×1 IMPLANT
SUT ETHIBOND 3-0 V-5 (SUTURE) IMPLANT
SUT ETHILON 4 0 PS 2 18 (SUTURE) ×1 IMPLANT
SUT MERSILENE 2.0 SH NDLE (SUTURE) IMPLANT
SUT MERSILENE 4 0 P 3 (SUTURE) IMPLANT
SUT VIC AB 4-0 PS2 18 (SUTURE) ×1 IMPLANT
SUT VICRYL 0 SH 27 (SUTURE) IMPLANT
SUTURE FIBERWR 2-0 18 17.9 3/8 (SUTURE) ×1 IMPLANT
SYR BULB EAR ULCER 3OZ GRN STR (SYRINGE) ×1 IMPLANT
SYR CONTROL 10ML LL (SYRINGE) IMPLANT
TOWEL GREEN STERILE FF (TOWEL DISPOSABLE) ×2 IMPLANT
UNDERPAD 30X36 HEAVY ABSORB (UNDERPADS AND DIAPERS) ×1 IMPLANT

## 2024-04-19 NOTE — Discharge Instructions (Addendum)

## 2024-04-19 NOTE — Anesthesia Preprocedure Evaluation (Addendum)
 Anesthesia Evaluation  Patient identified by MRN, date of birth, ID band Patient awake    Reviewed: Allergy  & Precautions, NPO status , Patient's Chart, lab work & pertinent test results  Airway Mallampati: II  TM Distance: >3 FB Neck ROM: Limited    Dental  (+) Dental Advisory Given, Teeth Intact   Pulmonary    Pulmonary exam normal breath sounds clear to auscultation       Cardiovascular hypertension, Pt. on medications Normal cardiovascular exam Rhythm:Regular Rate:Normal     Neuro/Psych  Headaches  Neuromuscular disease  negative psych ROS   GI/Hepatic Neg liver ROS,GERD  Medicated and Controlled,,  Endo/Other  Hypothyroidism    Renal/GU negative Renal ROS     Musculoskeletal  (+) Arthritis ,    Abdominal   Peds  Hematology  (+) Blood dyscrasia, anemia   Anesthesia Other Findings   Reproductive/Obstetrics                              Anesthesia Physical Anesthesia Plan  ASA: 2  Anesthesia Plan: General   Post-op Pain Management: Ofirmev  IV (intra-op)* and Toradol IV (intra-op)*   Induction: Intravenous  PONV Risk Score and Plan: 4 or greater and Ondansetron , Midazolam , TIVA and Propofol  infusion  Airway Management Planned: LMA  Additional Equipment: None  Intra-op Plan:   Post-operative Plan: Extubation in OR  Informed Consent: I have reviewed the patients History and Physical, chart, labs and discussed the procedure including the risks, benefits and alternatives for the proposed anesthesia with the patient or authorized representative who has indicated his/her understanding and acceptance.     Dental advisory given  Plan Discussed with: CRNA  Anesthesia Plan Comments: (Pt refused nerve block.  Risks of anesthesia explained at length. This includes, but is not limited to, sore throat, damage to teeth, lips gums, tongue and vocal cords, nausea and vomiting,  reactions to medications, stroke, heart attack, and death. All patient questions were answered and the patient wishes to proceed. Risks of peripheral nerve block explained at length. This includes, but is not limited to, bleeding, infection, reactions to the medications, seizures, damage to surrounding structures, damage to nerves, permanent weakness, numbness, tingling and pain. All patient questions were answered and patient wishes to proceed GA and possible post op nerve block. )        Anesthesia Quick Evaluation

## 2024-04-19 NOTE — Anesthesia Postprocedure Evaluation (Signed)
 Anesthesia Post Note  Patient: Patricia Solis  Procedure(s) Performed: CARPOMETACARPEL (CMC) SUSPENSION PLASTY; TRAPEZIECTOMY (Right: Thumb)     Patient location during evaluation: PACU Anesthesia Type: General Level of consciousness: sedated and patient cooperative Pain management: pain level controlled Vital Signs Assessment: post-procedure vital signs reviewed and stable Respiratory status: spontaneous breathing Cardiovascular status: stable Anesthetic complications: no   No notable events documented.  Last Vitals:  Vitals:   04/19/24 1545 04/19/24 1620  BP: 129/65 129/74  Pulse: 89 93  Resp: 16 18  Temp:  (!) 36.2 C  SpO2: 94% 96%    Last Pain:  Vitals:   04/19/24 1620  TempSrc: Temporal  PainSc:                  Norleen Pope

## 2024-04-19 NOTE — H&P (Signed)
 Patricia Solis is an 73 y.o. female.   Chief Complaint: cmc arthritis HPI: 73 yo female with right thumb cmc arthritis.  She has tried non operative measures without lasting relief.  She wishes to proceed with trapeziectomy and suspensionplasty.  Allergies:  Allergies  Allergen Reactions   Ambien Cr [Zolpidem] Other (See Comments)    Hallucinations   Cozaar  [Losartan ] Cough   Erythromycin     REACTION: rash   Hydrocodone  Other (See Comments)    Hallucinations   Lisinopril  Cough   Methyldibromoglutaronitrile Dermatitis   Penicillins     REACTION: rash   Bromine Rash    Per dermatologist    Clindamycin/Lincomycin Rash    Past Medical History:  Diagnosis Date   Basal cell carcinoma 2007   BCC   Breast cancer (HCC)    Invasive lobular carcinoma RIGHT breast   Carotid bruit 08/2008   Normal Doppler   DUB (dysfunctional uterine bleeding)    Esophagitis    GERD (gastroesophageal reflux disease)    HTN (hypertension)    Tx started 06/2010   Hyperlipidemia    Hypothyroidism    MHA (microangiopathic hemolytic anemia) (HCC)    Personal history of chemotherapy    2003   Personal history of radiation therapy    2003 right breast   Spinal stenosis     Past Surgical History:  Procedure Laterality Date   BREAST BIOPSY Right    2007 benign   BREAST LUMPECTOMY Right    2003   BTL     CARPAL TUNNEL RELEASE Right 08/08/2023   Procedure: CARPAL TUNNEL RELEASE;  Surgeon: Murrell Drivers, MD;  Location: [facility];  Service: Orthopedics;  Laterality: Right;   CATARACT EXTRACTION, BILATERAL Bilateral 03/26/2016   still blurred vision; Md states this is fine   CERVICAL LAMINECTOMY     Left   FOOT SURGERY     SHOULDER ARTHROSCOPY Right    TUBAL LIGATION  1981    Family History: Family History  Problem Relation Age of Onset   Heart attack Father    Coronary artery disease Father    Diabetes Father    Coronary artery disease Mother    Leukemia Mother     Thyroid  disease Mother    Multiple sclerosis Sister        Twin   Breast cancer Maternal Aunt     Social History:   reports that she has never smoked. She has never used smokeless tobacco. She reports that she does not drink alcohol and does not use drugs.  Medications: Medications Prior to Admission  Medication Sig Dispense Refill   B Complex-C (B-COMPLEX WITH VITAMIN C) tablet Take 1 tablet by mouth daily.     calcium  carbonate (OS-CAL) 600 MG TABS tablet Take 1 tablet (600 mg total) by mouth 2 (two) times daily with a meal. 180 tablet 3   Cholecalciferol (VITAMIN D3 PO) Take by mouth.     Coenzyme Q10 200 MG TABS Take 200 mg by mouth daily.     CRANBERRY PO Take 2 tablets by mouth at bedtime.     famotidine  (PEPCID ) 40 MG tablet TAKE 1 TABLET BY MOUTH ONCE DAILY AT BEDTIME 90 tablet 0   GLUCOSAMINE PO Take 1 tablet by mouth 2 (two) times daily.     hydrochlorothiazide  (MICROZIDE ) 12.5 MG capsule Take 1 capsule by mouth once daily 90 capsule 3   levothyroxine  (SYNTHROID ) 75 MCG tablet Take 1 tablet (75 mcg total) by mouth daily before  breakfast. 90 tablet 3   magnesium  oxide (MAG-OX) 400 MG tablet Take 400 mg by mouth daily.     omeprazole  (PRILOSEC ) 20 MG capsule Take 1 capsule by mouth once daily 90 capsule 3   rosuvastatin  (CRESTOR ) 40 MG tablet Take 1 tablet (40 mg total) by mouth daily. 90 tablet 3   hydrocortisone  2.5 % ointment APPLY OINTMENT EXTERNALLY TWICE DAILY 29 g 0   traMADol  (ULTRAM ) 50 MG tablet Take 1 tablet (50 mg total) by mouth every 6 (six) hours as needed. 1-2 tabs PO q6 hours prn pain 20 tablet 0   triamcinolone  cream (KENALOG ) 0.5 % Apply 1 Application topically 2 (two) times daily. 30 g 2    No results found for this or any previous visit (from the past 48 hours).  No results found.    Blood pressure 123/76, pulse 79, temperature 98 F (36.7 C), temperature source Temporal, resp. rate 16, height 5' 1 (1.549 m), weight 64.9 kg, SpO2 98%.  General  appearance: alert, cooperative, and appears stated age Head: Normocephalic, without obvious abnormality, atraumatic Neck: supple, symmetrical, trachea midline Extremities: Intact sensation and capillary refill all digits.  +epl/fpl/io.  No wounds.  Skin: Skin color, texture, turgor normal. No rashes or lesions Neurologic: Grossly normal Incision/Wound: none  Assessment/Plan Right thumb cmc arthritis.  Non operative and operative treatment options have been discussed with the patient and patient wishes to proceed with operative treatment. Risks, benefits, and alternatives of surgery have been discussed and the patient agrees with the plan of care.   Llewyn Heap 04/19/2024, 12:03 PM

## 2024-04-19 NOTE — Op Note (Signed)
 I assisted Surgeons and Role:    * Murrell Drivers, MD - Primary    DEWAINE Murrell Kuba, MD - Assisting on the Procedure(s): CARPOMETACARPEL [facility]) SUSPENSION PLASTY on 04/19/2024.  I provided assistance on this case as follows: Set up, approach, identification protection of the radial nerve, traction for identification and isolation of the artery, identification and isolation with removal of the trapezium, sling of the flexor carpi radialis tendon for transfer, placement of the micro link suture for terminal brace, transfer of the flexor carpi radialis tendon, closure of the wound and application of the dressing and splint.  Electronically signed by: Kuba Murrell, MD Date: 04/19/2024 Time: 3:01 PM

## 2024-04-19 NOTE — Transfer of Care (Signed)
 Immediate Anesthesia Transfer of Care Note  Patient: Patricia Solis  Procedure(s) Performed: Procedure(s) (LRB): CARPOMETACARPEL (CMC) SUSPENSION PLASTY (Right)  Patient Location: PACU  Anesthesia Type: General  Level of Consciousness: awake, oriented, sedated and patient cooperative  Airway & Oxygen Therapy: Patient Spontanous Breathing Room Air  Post-op Assessment: Report given to PACU RN and Post -op Vital signs reviewed and stable  Post vital signs: Reviewed and stable  Complications: No apparent anesthesia complications  Last Vitals:  Vitals Value Taken Time  BP 127/60 04/19/24 15:05  Temp    Pulse 101 04/19/24 15:05  Resp 13 04/19/24 15:05  SpO2 94 % 04/19/24 15:05  Vitals shown include unfiled device data.  Last Pain:  Vitals:   04/19/24 1121  TempSrc: Temporal  PainSc: 8       Patients Stated Pain Goal: 6 (04/19/24 1121)  Complications: No notable events documented.

## 2024-04-19 NOTE — Anesthesia Procedure Notes (Signed)
 Procedure Name: LMA Insertion Date/Time: 04/19/2024 2:01 PM  Performed by: Delayne Olam BIRCH, CRNAPre-anesthesia Checklist: Patient identified, Emergency Drugs available, Suction available and Patient being monitored Patient Re-evaluated:Patient Re-evaluated prior to induction Oxygen Delivery Method: Circle system utilized Preoxygenation: Pre-oxygenation with 100% oxygen Induction Type: IV induction Ventilation: Mask ventilation without difficulty LMA: LMA inserted LMA Size: 3.0 Number of attempts: 1 Airway Equipment and Method: Bite block Placement Confirmation: positive ETCO2 Tube secured with: Tape Dental Injury: Teeth and Oropharynx as per pre-operative assessment

## 2024-04-19 NOTE — Op Note (Signed)
 NAME: Patricia Solis MEDICAL RECORD NO: [id number] DATE OF BIRTH: 1951-01-24 FACILITY: [facility]ATION[facility] PHYSICIAN: Patricia Satz R. Laquesha Holcomb, MD   OPERATIVE REPORT   DATE OF PROCEDURE: 04/19/24    PREOPERATIVE DIAGNOSIS: Right thumb CMC arthritis   POSTOPERATIVE DIAGNOSIS: Right thumb CMC arthritis   PROCEDURE: Right thumb trapeziectomy and suspension plasty   SURGEON:  Patricia Solis, M.D.   ASSISTANT: Patricia Curia, MD   ANESTHESIA:  General   INTRAVENOUS FLUIDS:  Per anesthesia flow sheet.   ESTIMATED BLOOD LOSS:  Minimal.   COMPLICATIONS:  None.   SPECIMENS:  none   TOURNIQUET TIME:    Total Tourniquet Time Documented: Upper Arm (Right) - 42 minutes Total: Upper Arm (Right) - 42 minutes    DISPOSITION:  Stable to PACU.   INDICATIONS: 73 year old female with right thumb CMC osteoarthritis.  She has tried nonoperative measures without adequate relief.  She wishes to proceed with trapeziectomy and suspensioplasty.  Risks, benefits and alternatives of surgery were discussed including the risks of blood loss, infection, damage to nerves, vessels, tendons, ligaments, bone for surgery, need for additional surgery, complications with wound healing, continued pain, stiffness.  She voiced understanding of these risks and elected to proceed.  OPERATIVE COURSE:  After being identified preoperatively by myself,  the patient and I agreed on the procedure and site of the procedure.  The surgical site was marked.  Surgical consent had been signed. Preoperative IV antibiotic prophylaxis was given. She was transferred to the operating room and placed on the operating table in supine position with the Right upper extremity on an arm board.  General anesthesia was induced by the anesthesiologist.  Right upper extremity was prepped and draped in normal sterile orthopedic fashion.  A surgical pause was performed between the surgeons, anesthesia, and operating room staff and all  were in agreement as to the patient, procedure, and site of procedure.  Tourniquet at the proximal aspect of the extremity was inflated to 250 mmHg after exsanguination of the arm with an Esmarch bandage.  Incision was made at the dorsum of the thumb over the [facility] joint.  This was carried in subcutaneous tissues by spreading technique.  Bipolar electrocautery is used to obtain hemostasis.  The interval between the APL and EPB tendons was made.  The deep branch of the radial artery was identified and protected throughout the case.  The capsule was sharply incised.  The trapezium was freed up from soft tissue attachments.  It was removed in piecemeal fashion with the rongeurs.  The FCR tendon was identified.  Was placed under traction and half of the tendon harvested creating a distally based graft.  The microleak suture anchor was used.  The guidepin was advanced from the base of the thumb metacarpal adjacent to the APL tendon insertion across the thumb metacarpal and it through the index finger metacarpal.  C-arm was used in AP and lateral projections to ensure appropriate position of the guidepin which was the case.  An incision was made on the dorsum of the hand to retrieve the guidepin.  This was used to pass the micro and suture anchor.  The anchor was then tied over the dorsum of the index finger metacarpal.  Wound was closed with 4-0 nylon in a horizontal mattress fashion.  The FCR tendon was then passed around the APL tendon and back onto itself.  This was secured with 3-0 Ethibond suture.  C-arm was used in AP lateral oblique projections to ensure  appropriate suspension of the thumb metacarpal which was the case.  The wound was irrigated with sterile saline.  Inverted interrupted 4-0 Vicryl sutures were placed in the subcutaneous tissues and skin was closed with 4-0 nylon in a horizontal mattress fashion.  Wound was injected with quarter percent plain Marcaine  to aid in postoperative analgesia.  Wounds were  dressed with sterile Xeroform 4 x 4's and wrapped with a Kerlix bandage.  Thumb spica splint was placed and wrapped with Kerlix and Ace bandage.  The tourniquet was deflated at 42 minutes.  Fingertips were pink with brisk capillary refill after deflation of tourniquet.  The operative  drapes were broken down.  The patient was awoken from anesthesia safely.  She was transferred back to the stretcher and taken to PACU in stable condition.  I will see her back in the office in 1 week for postoperative followup.  I will give her a prescription for Tramadol  50 mg 1-2 tabs PO q6 hours prn pain, dispense #20.   Patricia Weist, MD Electronically signed, 04/19/24

## 2024-04-20 ENCOUNTER — Encounter (HOSPITAL_BASED_OUTPATIENT_CLINIC_OR_DEPARTMENT_OTHER): Payer: Self-pay | Admitting: Orthopedic Surgery

## 2024-04-27 DIAGNOSIS — M25641 Stiffness of right hand, not elsewhere classified: Secondary | ICD-10-CM | POA: Diagnosis not present

## 2024-04-27 DIAGNOSIS — M79641 Pain in right hand: Secondary | ICD-10-CM | POA: Diagnosis not present

## 2024-04-27 DIAGNOSIS — M1811 Unilateral primary osteoarthritis of first carpometacarpal joint, right hand: Secondary | ICD-10-CM | POA: Diagnosis not present

## 2024-04-27 DIAGNOSIS — M25631 Stiffness of right wrist, not elsewhere classified: Secondary | ICD-10-CM | POA: Diagnosis not present

## 2024-04-27 DIAGNOSIS — R531 Weakness: Secondary | ICD-10-CM | POA: Diagnosis not present

## 2024-05-03 DIAGNOSIS — R600 Localized edema: Secondary | ICD-10-CM | POA: Diagnosis not present

## 2024-05-03 DIAGNOSIS — M2011 Hallux valgus (acquired), right foot: Secondary | ICD-10-CM | POA: Diagnosis not present

## 2024-05-03 DIAGNOSIS — M84374A Stress fracture, right foot, initial encounter for fracture: Secondary | ICD-10-CM | POA: Diagnosis not present

## 2024-05-03 DIAGNOSIS — M7731 Calcaneal spur, right foot: Secondary | ICD-10-CM | POA: Diagnosis not present

## 2024-05-03 DIAGNOSIS — R262 Difficulty in walking, not elsewhere classified: Secondary | ICD-10-CM | POA: Diagnosis not present

## 2024-05-03 DIAGNOSIS — M7732 Calcaneal spur, left foot: Secondary | ICD-10-CM | POA: Diagnosis not present

## 2024-05-03 DIAGNOSIS — M2012 Hallux valgus (acquired), left foot: Secondary | ICD-10-CM | POA: Diagnosis not present

## 2024-05-07 ENCOUNTER — Other Ambulatory Visit: Payer: Self-pay | Admitting: Adult Health

## 2024-05-07 DIAGNOSIS — I10 Essential (primary) hypertension: Secondary | ICD-10-CM

## 2024-05-11 DIAGNOSIS — M79641 Pain in right hand: Secondary | ICD-10-CM | POA: Diagnosis not present

## 2024-05-11 DIAGNOSIS — M25641 Stiffness of right hand, not elsewhere classified: Secondary | ICD-10-CM | POA: Diagnosis not present

## 2024-05-11 DIAGNOSIS — R531 Weakness: Secondary | ICD-10-CM | POA: Diagnosis not present

## 2024-05-11 DIAGNOSIS — M25631 Stiffness of right wrist, not elsewhere classified: Secondary | ICD-10-CM | POA: Diagnosis not present

## 2024-05-17 DIAGNOSIS — M25631 Stiffness of right wrist, not elsewhere classified: Secondary | ICD-10-CM | POA: Diagnosis not present

## 2024-05-17 DIAGNOSIS — M79641 Pain in right hand: Secondary | ICD-10-CM | POA: Diagnosis not present

## 2024-05-17 DIAGNOSIS — R531 Weakness: Secondary | ICD-10-CM | POA: Diagnosis not present

## 2024-05-17 DIAGNOSIS — M25641 Stiffness of right hand, not elsewhere classified: Secondary | ICD-10-CM | POA: Diagnosis not present

## 2024-05-21 DIAGNOSIS — M25631 Stiffness of right wrist, not elsewhere classified: Secondary | ICD-10-CM | POA: Diagnosis not present

## 2024-05-21 DIAGNOSIS — R531 Weakness: Secondary | ICD-10-CM | POA: Diagnosis not present

## 2024-05-21 DIAGNOSIS — M79641 Pain in right hand: Secondary | ICD-10-CM | POA: Diagnosis not present

## 2024-05-21 DIAGNOSIS — M25641 Stiffness of right hand, not elsewhere classified: Secondary | ICD-10-CM | POA: Diagnosis not present

## 2024-05-24 DIAGNOSIS — M2012 Hallux valgus (acquired), left foot: Secondary | ICD-10-CM | POA: Diagnosis not present

## 2024-05-24 DIAGNOSIS — M7732 Calcaneal spur, left foot: Secondary | ICD-10-CM | POA: Diagnosis not present

## 2024-05-24 DIAGNOSIS — M84374D Stress fracture, right foot, subsequent encounter for fracture with routine healing: Secondary | ICD-10-CM | POA: Diagnosis not present

## 2024-05-24 DIAGNOSIS — R262 Difficulty in walking, not elsewhere classified: Secondary | ICD-10-CM | POA: Diagnosis not present

## 2024-05-24 DIAGNOSIS — M7731 Calcaneal spur, right foot: Secondary | ICD-10-CM | POA: Diagnosis not present

## 2024-05-24 DIAGNOSIS — M2011 Hallux valgus (acquired), right foot: Secondary | ICD-10-CM | POA: Diagnosis not present

## 2024-05-24 DIAGNOSIS — R600 Localized edema: Secondary | ICD-10-CM | POA: Diagnosis not present

## 2024-05-30 DIAGNOSIS — R531 Weakness: Secondary | ICD-10-CM | POA: Diagnosis not present

## 2024-05-30 DIAGNOSIS — M79641 Pain in right hand: Secondary | ICD-10-CM | POA: Diagnosis not present

## 2024-05-30 DIAGNOSIS — M25641 Stiffness of right hand, not elsewhere classified: Secondary | ICD-10-CM | POA: Diagnosis not present

## 2024-05-30 DIAGNOSIS — M25631 Stiffness of right wrist, not elsewhere classified: Secondary | ICD-10-CM | POA: Diagnosis not present

## 2024-05-31 ENCOUNTER — Ambulatory Visit: Payer: Self-pay | Admitting: Adult Health

## 2024-05-31 ENCOUNTER — Ambulatory Visit: Payer: PPO | Admitting: Adult Health

## 2024-05-31 ENCOUNTER — Encounter: Payer: Self-pay | Admitting: Adult Health

## 2024-05-31 VITALS — BP 120/70 | HR 71 | Temp 98.3°F | Ht 60.24 in | Wt 145.0 lb

## 2024-05-31 DIAGNOSIS — G43811 Other migraine, intractable, with status migrainosus: Secondary | ICD-10-CM

## 2024-05-31 DIAGNOSIS — E039 Hypothyroidism, unspecified: Secondary | ICD-10-CM

## 2024-05-31 DIAGNOSIS — I10 Essential (primary) hypertension: Secondary | ICD-10-CM

## 2024-05-31 DIAGNOSIS — Z0000 Encounter for general adult medical examination without abnormal findings: Secondary | ICD-10-CM

## 2024-05-31 DIAGNOSIS — E782 Mixed hyperlipidemia: Secondary | ICD-10-CM | POA: Diagnosis not present

## 2024-05-31 DIAGNOSIS — K219 Gastro-esophageal reflux disease without esophagitis: Secondary | ICD-10-CM

## 2024-05-31 LAB — COMPREHENSIVE METABOLIC PANEL WITH GFR
ALT: 15 U/L (ref 0–35)
AST: 15 U/L (ref 0–37)
Albumin: 4.4 g/dL (ref 3.5–5.2)
Alkaline Phosphatase: 65 U/L (ref 39–117)
BUN: 15 mg/dL (ref 6–23)
CO2: 29 meq/L (ref 19–32)
Calcium: 9.4 mg/dL (ref 8.4–10.5)
Chloride: 101 meq/L (ref 96–112)
Creatinine, Ser: 0.63 mg/dL (ref 0.40–1.20)
GFR: 88.1 mL/min (ref >60.00)
Glucose, Bld: 91 mg/dL (ref 70–99)
Potassium: 3.3 meq/L — ABNORMAL LOW (ref 3.5–5.1)
Sodium: 140 meq/L (ref 135–145)
Total Bilirubin: 0.4 mg/dL (ref 0.2–1.2)
Total Protein: 7.3 g/dL (ref 6.0–8.3)

## 2024-05-31 LAB — CBC WITH DIFFERENTIAL/PLATELET
Basophils Absolute: 0.1 K/uL (ref 0.0–0.1)
Basophils Relative: 1 % (ref 0.0–3.0)
Eosinophils Absolute: 0.3 K/uL (ref 0.0–0.7)
Eosinophils Relative: 4.2 % (ref 0.0–5.0)
HCT: 40.2 % (ref 36.0–46.0)
Hemoglobin: 13.5 g/dL (ref 12.0–15.0)
Lymphocytes Relative: 27.5 % (ref 12.0–46.0)
Lymphs Abs: 1.7 K/uL (ref 0.7–4.0)
MCHC: 33.6 g/dL (ref 30.0–36.0)
MCV: 91.5 fl (ref 78.0–100.0)
Monocytes Absolute: 0.4 K/uL (ref 0.1–1.0)
Monocytes Relative: 6.3 % (ref 3.0–12.0)
Neutro Abs: 3.7 K/uL (ref 1.4–7.7)
Neutrophils Relative %: 61 % (ref 43.0–77.0)
Platelets: 265 K/uL (ref 150.0–400.0)
RBC: 4.39 Mil/uL (ref 3.87–5.11)
RDW: 14.5 % (ref 11.5–15.5)
WBC: 6 K/uL (ref 4.0–10.5)

## 2024-05-31 LAB — LIPID PANEL
Cholesterol: 159 mg/dL (ref 0–200)
HDL: 72.1 mg/dL (ref >39.00)
LDL Cholesterol: 74 mg/dL (ref 0–99)
NonHDL: 86.49
Total CHOL/HDL Ratio: 2
Triglycerides: 62 mg/dL (ref 0.0–149.0)
VLDL: 12.4 mg/dL (ref 0.0–40.0)

## 2024-05-31 LAB — TSH: TSH: 1.41 u[IU]/mL (ref 0.35–5.50)

## 2024-05-31 MED ORDER — ROSUVASTATIN CALCIUM 40 MG PO TABS: 40.0000 mg | ORAL_TABLET | Freq: Every day | ORAL | 3 refills | Status: AC

## 2024-05-31 MED ORDER — LEVOTHYROXINE SODIUM 75 MCG PO TABS: 75.0000 ug | ORAL_TABLET | Freq: Every day | ORAL | 3 refills | Status: AC

## 2024-05-31 MED ORDER — OMEPRAZOLE 20 MG PO CPDR
20.0000 mg | DELAYED_RELEASE_CAPSULE | Freq: Every day | ORAL | 3 refills | Status: DC
  Filled 2024-07-25: qty 90, 90d supply, fill #0

## 2024-05-31 NOTE — Patient Instructions (Signed)
 It was great seeing you today   We will follow up with you regarding your lab work   Please let me know if you need anything

## 2024-05-31 NOTE — Progress Notes (Signed)
 Subjective:    Patient ID: Patricia Solis, female    DOB: 05-22-1951, 73 y.o.   MRN: [id number]  HPI Patient presents for yearly preventative medicine examination. She is a pleasant 73 year old female who  has a past medical history of Basal cell carcinoma (2007), Breast cancer (HCC), Carotid bruit (08/2008), DUB (dysfunctional uterine bleeding), Esophagitis, GERD (gastroesophageal reflux disease), HTN (hypertension), Hyperlipidemia, Hypothyroidism, MHA (microangiopathic hemolytic anemia) (HCC), Personal history of chemotherapy, Personal history of radiation therapy, and Spinal stenosis.  Hypertension - managed with HCTZ 12.5 mg. She does check her blood pressure at home and reports readings in the 120s to 130s over 70s to 80s.  She denies dizziness, lightheadedness, chest pain, or shortness of breath BP Readings from Last 3 Encounters:  05/31/24 120/70  04/19/24 129/74  01/03/24 110/70   Hypothyroidism-maintained on Synthroid  88 mcg daily Lab Results  Component Value Date   TSH 3.05 05/31/2023   Hyperlipidemia-managed with simvastatin  40 mg daily.  She denies myalgia or fatigue Lab Results  Component Value Date   CHOL 167 05/31/2023   HDL 73.00 05/31/2023   LDLCALC 82 05/31/2023   LDLDIRECT 140.4 09/02/2006   TRIG 58.0 05/31/2023   CHOLHDL 2 05/31/2023   GERD-controlled with Prilosec  20 mg daily and famotidine  40 mg daily.   Migraine headaches-infrequent migraine headaches.  She has been seen by neurology in the past but did not have good results with the medications that they prescribed.  When she does have a migraine headache she will use BC powder at home and this seems to work better than prescription medications for her  All immunizations and health maintenance protocols were reviewed with the patient and needed orders were placed. She is up to date on vaccinations.   Appropriate screening laboratory values were ordered for the patient including screening of  hyperlipidemia, renal function and hepatic function.  Medication reconciliation,  past medical history, social history, problem list and allergies were reviewed in detail with the patient  Goals were established with regard to weight loss, exercise, and  diet in compliance with medications. She does stay active and eat healthys.  Wt Readings from Last 3 Encounters:  05/31/24 145 lb (65.8 kg)  04/19/24 143 lb 1.3 oz (64.9 kg)  01/03/24 144 lb (65.3 kg)   She is up to date on GYN care, mammograms and bone density screening  Review of Systems  Constitutional: Negative.   HENT: Negative.    Eyes: Negative.   Respiratory: Negative.    Cardiovascular: Negative.   Gastrointestinal: Negative.   Endocrine: Negative.   Genitourinary: Negative.   Musculoskeletal: Negative.   Skin: Negative.   Allergic/Immunologic: Negative.   Neurological: Negative.   Hematological: Negative.   Psychiatric/Behavioral: Negative.     Past Medical History:  Diagnosis Date   Basal cell carcinoma 2007   BCC   Breast cancer (HCC)    Invasive lobular carcinoma RIGHT breast   Carotid bruit 08/2008   Normal Doppler   DUB (dysfunctional uterine bleeding)    Esophagitis    GERD (gastroesophageal reflux disease)    HTN (hypertension)    Tx started 06/2010   Hyperlipidemia    Hypothyroidism    MHA (microangiopathic hemolytic anemia) (HCC)    Personal history of chemotherapy    2003   Personal history of radiation therapy    2003 right breast   Spinal stenosis     Social History   Socioeconomic History   Marital status: Married    Spouse  name: Not on file   Number of children: Not on file   Years of education: Not on file   Highest education level: 12th grade  Occupational History   Not on file  Tobacco Use   Smoking status: Never   Smokeless tobacco: Never  Vaping Use   Vaping status: Never Used  Substance and Sexual Activity   Alcohol use: No   Drug use: No   Sexual activity: Not on file   Other Topics Concern   Not on file  Social History Narrative   Retired in March    Married for 38 years    One daughter, lives in [place]    Has four grandchildren      Likes to play with grand kids, go to [employer] and [place].       Social Drivers of Corporate Investment Banker Strain: Low Risk  (04/05/2024)   Overall Financial Resource Strain (CARDIA)    Difficulty of Paying Living Expenses: Not hard at all  Food Insecurity: No Food Insecurity (04/05/2024)   Hunger Vital Sign    Worried About Running Out of Food in the Last Year: Never true    Ran Out of Food in the Last Year: Never true  Transportation Needs: No Transportation Needs (04/05/2024)   PRAPARE - Administrator, Civil Service (Medical): No    Lack of Transportation (Non-Medical): No  Physical Activity: Sufficiently Active (04/05/2024)   Exercise Vital Sign    Days of Exercise per Week: 4 days    Minutes of Exercise per Session: 70 min  Stress: No Stress Concern Present (04/05/2024)   [employer] of Occupational Health - Occupational Stress Questionnaire    Feeling of Stress: Not at all  Social Connections: Moderately Integrated (04/05/2024)   Social Connection and Isolation Panel    Frequency of Communication with Friends and Family: Three times a week    Frequency of Social Gatherings with Friends and Family: Twice a week    Attends Religious Services: 1 to 4 times per year    Active Member of [employer] or Organizations: No    Attends Engineer, Structural: Not on file    Marital Status: Married  Catering Manager Violence: Not At Risk (04/08/2021)   Humiliation, Afraid, Rape, and Kick questionnaire    Fear of Current or Ex-Partner: No    Emotionally Abused: No    Physically Abused: No    Sexually Abused: No    Past Surgical History:  Procedure Laterality Date   BREAST BIOPSY Right    2007 benign   BREAST LUMPECTOMY Right    2003   BTL     CARPAL TUNNEL RELEASE Right 08/08/2023    Procedure: CARPAL TUNNEL RELEASE;  Surgeon: Murrell Drivers, MD;  Location: [facility];  Service: Orthopedics;  Laterality: Right;   CARPOMETACARPEL SUSPENSION PLASTY Right 04/19/2024   Procedure: CARPOMETACARPEL [facility]) SUSPENSION PLASTY; TRAPEZIECTOMY;  Surgeon: Murrell Drivers, MD;  Location: [facility];  Service: Orthopedics;  Laterality: Right;  RIGHT THUMB TRAPEZIECTOMY AND SUSPENSIONPLASTY   CATARACT EXTRACTION, BILATERAL Bilateral 03/26/2016   still blurred vision; Md states this is fine   CERVICAL LAMINECTOMY     Left   FOOT SURGERY     SHOULDER ARTHROSCOPY Right    TUBAL LIGATION  1981    Family History  Problem Relation Age of Onset   Heart attack Father    Coronary artery disease Father    Diabetes Father  Coronary artery disease Mother    Leukemia Mother    Thyroid  disease Mother    Multiple sclerosis Sister        Twin   Breast cancer Maternal Aunt     Allergies  Allergen Reactions   Ambien Cr [Zolpidem] Other (See Comments)    Hallucinations   Cozaar  [Losartan ] Cough   Erythromycin     REACTION: rash   Hydrocodone  Other (See Comments)    Hallucinations   Lisinopril  Cough   Methyldibromoglutaronitrile Dermatitis   Penicillins     REACTION: rash   Bromine Rash    Per dermatologist    Clindamycin/Lincomycin Rash    Current Outpatient Medications on File Prior to Visit  Medication Sig Dispense Refill   B Complex-C (B-COMPLEX WITH VITAMIN C) tablet Take 1 tablet by mouth daily.     calcium  carbonate (OS-CAL) 600 MG TABS tablet Take 1 tablet (600 mg total) by mouth 2 (two) times daily with a meal. 180 tablet 3   Cholecalciferol (VITAMIN D3 PO) Take by mouth.     Coenzyme Q10 200 MG TABS Take 200 mg by mouth daily.     CRANBERRY PO Take 2 tablets by mouth at bedtime.     famotidine  (PEPCID ) 40 MG tablet TAKE 1 TABLET BY MOUTH ONCE DAILY AT BEDTIME 90 tablet 3   GLUCOSAMINE PO Take 1 tablet by mouth 2 (two) times daily.      hydrochlorothiazide  (MICROZIDE ) 12.5 MG capsule Take 1 capsule by mouth once daily 90 capsule 3   hydrocortisone  2.5 % ointment APPLY OINTMENT EXTERNALLY TWICE DAILY 29 g 0   levothyroxine  (SYNTHROID ) 75 MCG tablet Take 1 tablet (75 mcg total) by mouth daily before breakfast. 90 tablet 3   magnesium  oxide (MAG-OX) 400 MG tablet Take 400 mg by mouth daily.     omeprazole  (PRILOSEC ) 20 MG capsule Take 1 capsule by mouth once daily 90 capsule 3   rosuvastatin  (CRESTOR ) 40 MG tablet Take 1 tablet (40 mg total) by mouth daily. 90 tablet 3   traMADol  (ULTRAM ) 50 MG tablet 1-2 tabs PO q6 hours prn pain 20 tablet 0   triamcinolone  cream (KENALOG ) 0.5 % Apply 1 Application topically 2 (two) times daily. 30 g 2   No current facility-administered medications on file prior to visit.    BP 120/70   Pulse 71   Temp 98.3 F (36.8 C) (Oral)   Ht 5' 0.24 (1.53 m)   Wt 145 lb (65.8 kg)   SpO2 96%   BMI 28.10 kg/m       Objective:   Physical Exam Vitals and nursing note reviewed.  Constitutional:      General: She is not in acute distress.    Appearance: Normal appearance. She is not ill-appearing.  HENT:     Head: Normocephalic and atraumatic.     Right Ear: Tympanic membrane, ear canal and external ear normal. There is no impacted cerumen.     Left Ear: Tympanic membrane, ear canal and external ear normal. There is no impacted cerumen.     Nose: Nose normal. No congestion or rhinorrhea.     Mouth/Throat:     Mouth: Mucous membranes are moist.     Pharynx: Oropharynx is clear.  Eyes:     Extraocular Movements: Extraocular movements intact.     Conjunctiva/sclera: Conjunctivae normal.     Pupils: Pupils are equal, round, and reactive to light.  Neck:     Vascular: No carotid bruit.  Cardiovascular:  Rate and Rhythm: Normal rate and regular rhythm.     Pulses: Normal pulses.     Heart sounds: No murmur heard.    No friction rub. No gallop.  Pulmonary:     Effort: Pulmonary effort is  normal.     Breath sounds: Normal breath sounds.  Abdominal:     General: Abdomen is flat. Bowel sounds are normal. There is no distension.     Palpations: Abdomen is soft. There is no mass.     Tenderness: There is no abdominal tenderness. There is no guarding or rebound.     Hernia: No hernia is present.  Musculoskeletal:        General: Normal range of motion.     Cervical back: Normal range of motion and neck supple.  Lymphadenopathy:     Cervical: No cervical adenopathy.  Skin:    General: Skin is warm and dry.     Capillary Refill: Capillary refill takes less than 2 seconds.  Neurological:     General: No focal deficit present.     Mental Status: She is alert and oriented to person, place, and time.  Psychiatric:        Mood and Affect: Mood normal.        Behavior: Behavior normal.        Thought Content: Thought content normal.        Judgment: Judgment normal.       Assessment & Plan:  1. Routine general medical examination at a health care facility (Primary) Today patient counseled on age appropriate routine health concerns for screening and prevention, each reviewed and up to date or declined. Immunizations reviewed and up to date or declined. Labs ordered and reviewed. Risk factors for depression reviewed and negative. Hearing function and visual acuity are intact. ADLs screened and addressed as needed. Functional ability and level of safety reviewed and appropriate. Education, counseling and referrals performed based on assessed risks today. Patient provided with a copy of personalized plan for preventive services. - Follow up in one year or sooner if needed - eat healthy and exercise   2. Essential hypertension - well controlled. No change in medication  - CBC with Differential/Platelet; Future - Comprehensive metabolic panel with GFR; Future - Lipid panel; Future - TSH; Future  3. Hypothyroidism, unspecified type - Consider dose change of synthroid   - CBC with  Differential/Platelet; Future - Comprehensive metabolic panel with GFR; Future - Lipid panel; Future - TSH; Future  4. Mixed hyperlipidemia - Consider dose change of simvastatin   - CBC with Differential/Platelet; Future - Comprehensive metabolic panel with GFR; Future - Lipid panel; Future - TSH; Future  5. Gastroesophageal reflux disease, unspecified whether esophagitis present - Controlled. No change  - CBC with Differential/Platelet; Future - Comprehensive metabolic panel with GFR; Future - Lipid panel; Future - TSH; Future  6. Other migraine with status migrainosus, intractable - Controlled.  - CBC with Differential/Platelet; Future - Comprehensive metabolic panel with GFR; Future - Lipid panel; Future - TSH; Future   Darleene Shape, NP

## 2024-06-07 DIAGNOSIS — R87615 Unsatisfactory cytologic smear of cervix: Secondary | ICD-10-CM | POA: Diagnosis not present

## 2024-06-07 DIAGNOSIS — M8588 Other specified disorders of bone density and structure, other site: Secondary | ICD-10-CM | POA: Diagnosis not present

## 2024-06-07 DIAGNOSIS — N958 Other specified menopausal and perimenopausal disorders: Secondary | ICD-10-CM | POA: Diagnosis not present

## 2024-06-08 DIAGNOSIS — R531 Weakness: Secondary | ICD-10-CM | POA: Diagnosis not present

## 2024-06-08 DIAGNOSIS — M25641 Stiffness of right hand, not elsewhere classified: Secondary | ICD-10-CM | POA: Diagnosis not present

## 2024-06-08 DIAGNOSIS — M25631 Stiffness of right wrist, not elsewhere classified: Secondary | ICD-10-CM | POA: Diagnosis not present

## 2024-06-08 DIAGNOSIS — M79641 Pain in right hand: Secondary | ICD-10-CM | POA: Diagnosis not present

## 2024-06-13 DIAGNOSIS — M1811 Unilateral primary osteoarthritis of first carpometacarpal joint, right hand: Secondary | ICD-10-CM | POA: Diagnosis not present

## 2024-06-14 DIAGNOSIS — R262 Difficulty in walking, not elsewhere classified: Secondary | ICD-10-CM | POA: Diagnosis not present

## 2024-06-14 DIAGNOSIS — R600 Localized edema: Secondary | ICD-10-CM | POA: Diagnosis not present

## 2024-06-14 DIAGNOSIS — M25641 Stiffness of right hand, not elsewhere classified: Secondary | ICD-10-CM | POA: Diagnosis not present

## 2024-06-14 DIAGNOSIS — M2011 Hallux valgus (acquired), right foot: Secondary | ICD-10-CM | POA: Diagnosis not present

## 2024-06-14 DIAGNOSIS — R531 Weakness: Secondary | ICD-10-CM | POA: Diagnosis not present

## 2024-06-14 DIAGNOSIS — M25631 Stiffness of right wrist, not elsewhere classified: Secondary | ICD-10-CM | POA: Diagnosis not present

## 2024-06-14 DIAGNOSIS — M7731 Calcaneal spur, right foot: Secondary | ICD-10-CM | POA: Diagnosis not present

## 2024-06-14 DIAGNOSIS — M79641 Pain in right hand: Secondary | ICD-10-CM | POA: Diagnosis not present

## 2024-06-14 DIAGNOSIS — M2012 Hallux valgus (acquired), left foot: Secondary | ICD-10-CM | POA: Diagnosis not present

## 2024-06-14 DIAGNOSIS — M84374D Stress fracture, right foot, subsequent encounter for fracture with routine healing: Secondary | ICD-10-CM | POA: Diagnosis not present

## 2024-06-14 DIAGNOSIS — M7732 Calcaneal spur, left foot: Secondary | ICD-10-CM | POA: Diagnosis not present

## 2024-06-18 DIAGNOSIS — M25641 Stiffness of right hand, not elsewhere classified: Secondary | ICD-10-CM | POA: Diagnosis not present

## 2024-06-18 DIAGNOSIS — M79641 Pain in right hand: Secondary | ICD-10-CM | POA: Diagnosis not present

## 2024-06-18 DIAGNOSIS — R531 Weakness: Secondary | ICD-10-CM | POA: Diagnosis not present

## 2024-06-18 DIAGNOSIS — M25631 Stiffness of right wrist, not elsewhere classified: Secondary | ICD-10-CM | POA: Diagnosis not present

## 2024-06-25 DIAGNOSIS — M25641 Stiffness of right hand, not elsewhere classified: Secondary | ICD-10-CM | POA: Diagnosis not present

## 2024-06-25 DIAGNOSIS — M25631 Stiffness of right wrist, not elsewhere classified: Secondary | ICD-10-CM | POA: Diagnosis not present

## 2024-06-25 DIAGNOSIS — R531 Weakness: Secondary | ICD-10-CM | POA: Diagnosis not present

## 2024-06-25 DIAGNOSIS — M79641 Pain in right hand: Secondary | ICD-10-CM | POA: Diagnosis not present

## 2024-06-29 ENCOUNTER — Ambulatory Visit: Admitting: Adult Health

## 2024-06-29 VITALS — BP 110/62 | HR 85 | Temp 99.0°F | Wt 146.0 lb

## 2024-06-29 DIAGNOSIS — J020 Streptococcal pharyngitis: Secondary | ICD-10-CM

## 2024-06-29 DIAGNOSIS — J029 Acute pharyngitis, unspecified: Secondary | ICD-10-CM

## 2024-06-29 LAB — POCT RAPID STREP A (OFFICE): Rapid Strep A Screen: POSITIVE — AB

## 2024-06-29 MED ORDER — PREDNISONE 10 MG PO TABS: 10.0000 mg | ORAL_TABLET | Freq: Every day | ORAL | 0 refills | Status: DC

## 2024-06-29 MED ORDER — CEPHALEXIN 500 MG PO CAPS: 500.0000 mg | ORAL_CAPSULE | Freq: Three times a day (TID) | ORAL | 0 refills | Status: AC

## 2024-06-29 NOTE — Addendum Note (Signed)
 Addended by: VICCI LEADER R on: 06/29/2024 02:09 PM   Modules accepted: Orders

## 2024-06-29 NOTE — Progress Notes (Signed)
 Subjective:    Patient ID: Patricia Solis, female    DOB: [birth year], 73 y.o.   MRN: [id number]  HPI  Discussed the use of AI scribe software for clinical note transcription with the patient, who gave verbal consent to proceed.  History of Present Illness   Patricia Solis is a 73 year old female who presents with a sore throat and possible strep throat.  She has had a sore throat since Sunday, with spots at the back of her throat. The pain is worse in the morning on waking. She has not had fevers or chills. She is allergic to penicillin, which causes a rash, and has not used antibiotics recently. She has no sinus pain or pressure and no significant cough currently.       Review of Systems See HPI   Past Medical History:  Diagnosis Date   Basal cell carcinoma 2007   BCC   Breast cancer (HCC)    Invasive lobular carcinoma RIGHT breast   Carotid bruit 08/2008   Normal Doppler   DUB (dysfunctional uterine bleeding)    Esophagitis    GERD (gastroesophageal reflux disease)    HTN (hypertension)    Tx started 06/2010   Hyperlipidemia    Hypothyroidism    MHA (microangiopathic hemolytic anemia) (HCC)    Personal history of chemotherapy    2003   Personal history of radiation therapy    2003 right breast   Spinal stenosis     Social History   Socioeconomic History   Marital status: Married    Spouse name: Not on file   Number of children: Not on file   Years of education: Not on file   Highest education level: 12th grade  Occupational History   Not on file  Tobacco Use   Smoking status: Never   Smokeless tobacco: Never  Vaping Use   Vaping status: Never Used  Substance and Sexual Activity   Alcohol use: No   Drug use: No   Sexual activity: Not on file  Other Topics Concern   Not on file  Social History Narrative   Retired in March    Married for 38 years    One daughter, lives in [place]    Has four grandchildren      Likes to play with grand kids, go  to [employer] and [place].       Social Drivers of Corporate Investment Banker Strain: Low Risk  (06/29/2024)   Overall Financial Resource Strain (CARDIA)    Difficulty of Paying Living Expenses: Not hard at all  Food Insecurity: No Food Insecurity (06/29/2024)   Hunger Vital Sign    Worried About Running Out of Food in the Last Year: Never true    Ran Out of Food in the Last Year: Never true  Transportation Needs: No Transportation Needs (06/29/2024)   PRAPARE - Administrator, Civil Service (Medical): No    Lack of Transportation (Non-Medical): No  Physical Activity: Insufficiently Active (06/29/2024)   Exercise Vital Sign    Days of Exercise per Week: 3 days    Minutes of Exercise per Session: 30 min  Stress: No Stress Concern Present (06/29/2024)   [employer] of Occupational Health - Occupational Stress Questionnaire    Feeling of Stress: Not at all  Social Connections: Moderately Integrated (06/29/2024)   Social Connection and Isolation Panel    Frequency of Communication with Friends and Family: Three times a week  Frequency of Social Gatherings with Friends and Family: Once a week    Attends Religious Services: More than 4 times per year    Active Member of [employer] or Organizations: No    Attends Banker Meetings: Not on file    Marital Status: Married  Catering Manager Violence: Not At Risk (04/08/2021)   Humiliation, Afraid, Rape, and Kick questionnaire    Fear of Current or Ex-Partner: No    Emotionally Abused: No    Physically Abused: No    Sexually Abused: No    Past Surgical History:  Procedure Laterality Date   BREAST BIOPSY Right    2007 benign   BREAST LUMPECTOMY Right    2003   BTL     CARPAL TUNNEL RELEASE Right 08/08/2023   Procedure: CARPAL TUNNEL RELEASE;  Surgeon: Murrell Drivers, MD;  Location: [facility];  Service: Orthopedics;  Laterality: Right;   CARPOMETACARPEL SUSPENSION PLASTY Right 04/19/2024    Procedure: CARPOMETACARPEL [facility]) SUSPENSION PLASTY; TRAPEZIECTOMY;  Surgeon: Murrell Drivers, MD;  Location: [facility];  Service: Orthopedics;  Laterality: Right;  RIGHT THUMB TRAPEZIECTOMY AND SUSPENSIONPLASTY   CATARACT EXTRACTION, BILATERAL Bilateral 03/26/2016   still blurred vision; Md states this is fine   CERVICAL LAMINECTOMY     Left   FOOT SURGERY     SHOULDER ARTHROSCOPY Right    TUBAL LIGATION  1981    Family History  Problem Relation Age of Onset   Heart attack Father    Coronary artery disease Father    Diabetes Father    Coronary artery disease Mother    Leukemia Mother    Thyroid  disease Mother    Multiple sclerosis Sister        Twin   Breast cancer Maternal Aunt     Allergies  Allergen Reactions   Ambien Cr [Zolpidem] Other (See Comments)    Hallucinations   Cozaar  [Losartan ] Cough   Erythromycin     REACTION: rash   Hydrocodone  Other (See Comments)    Hallucinations   Lisinopril  Cough   Methyldibromoglutaronitrile Dermatitis   Penicillins     REACTION: rash   Bromine Rash    Per dermatologist    Clindamycin/Lincomycin Rash    Current Outpatient Medications on File Prior to Visit  Medication Sig Dispense Refill   B Complex-C (B-COMPLEX WITH VITAMIN C) tablet Take 1 tablet by mouth daily.     calcium  carbonate (OS-CAL) 600 MG TABS tablet Take 1 tablet (600 mg total) by mouth 2 (two) times daily with a meal. 180 tablet 3   Cholecalciferol (VITAMIN D3 PO) Take by mouth.     Coenzyme Q10 200 MG TABS Take 200 mg by mouth daily.     CRANBERRY PO Take 2 tablets by mouth at bedtime.     famotidine  (PEPCID ) 40 MG tablet TAKE 1 TABLET BY MOUTH ONCE DAILY AT BEDTIME 90 tablet 3   GLUCOSAMINE PO Take 1 tablet by mouth 2 (two) times daily.     hydrochlorothiazide  (MICROZIDE ) 12.5 MG capsule Take 1 capsule by mouth once daily 90 capsule 3   hydrocortisone  2.5 % ointment APPLY OINTMENT EXTERNALLY TWICE DAILY 29 g 0   levothyroxine  (SYNTHROID ) 75 MCG  tablet Take 1 tablet (75 mcg total) by mouth daily before breakfast. 90 tablet 3   magnesium  oxide (MAG-OX) 400 MG tablet Take 400 mg by mouth daily.     omeprazole  (PRILOSEC ) 20 MG capsule Take 1 capsule (20 mg total) by mouth daily. 90  capsule 3   rosuvastatin  (CRESTOR ) 40 MG tablet Take 1 tablet (40 mg total) by mouth daily. 90 tablet 3   No current facility-administered medications on file prior to visit.    BP 110/62   Pulse 85   Temp 99 F (37.2 C) (Tympanic)   Wt 146 lb (66.2 kg)   SpO2 97%   BMI 28.29 kg/m       Objective:   Physical Exam Vitals and nursing note reviewed.  Constitutional:      Appearance: Normal appearance.  HENT:     Mouth/Throat:     Pharynx: Oropharynx is clear. Posterior oropharyngeal erythema present. No oropharyngeal exudate.     Tonsils: Tonsillar exudate present. No tonsillar abscesses. 2+ on the right. 2+ on the left.  Cardiovascular:     Rate and Rhythm: Normal rate and regular rhythm.     Pulses: Normal pulses.     Heart sounds: Normal heart sounds.  Pulmonary:     Effort: Pulmonary effort is normal.     Breath sounds: Normal breath sounds.  Musculoskeletal:        General: Normal range of motion.  Lymphadenopathy:     Head:     Right side of head: Tonsillar adenopathy present.     Left side of head: Tonsillar adenopathy present.  Skin:    General: Skin is warm and dry.     Capillary Refill: Capillary refill takes less than 2 seconds.  Neurological:     General: No focal deficit present.     Mental Status: She is alert and oriented to person, place, and time.  Psychiatric:        Mood and Affect: Mood normal.        Behavior: Behavior normal.        Thought Content: Thought content normal.        Judgment: Judgment normal.           Assessment & Plan:   Assessment and Plan    Streptococcal pharyngitis Acute streptococcal pharyngitis. No fever or chills. Pain more pronounced in mornings. - Prescribed Keflex  500 mg TID  for 10 days due to penicillin allergy . - Offered prednisone  10 mg daily for 5 days. - Advised against sharing utensils or drinks - Follow up if not improving over the next 4-5 days or sooner if fever develops      Darleene Shape, NP  I personally spent a total of 30 minutes in the care of the patient today including preparing to see the patient, getting/reviewing separately obtained history, performing a medically appropriate exam/evaluation, counseling and educating, placing orders, and documenting clinical information in the EHR.

## 2024-07-24 ENCOUNTER — Other Ambulatory Visit (HOSPITAL_COMMUNITY): Payer: Self-pay

## 2024-07-25 ENCOUNTER — Other Ambulatory Visit: Payer: Self-pay

## 2024-07-25 ENCOUNTER — Other Ambulatory Visit (HOSPITAL_COMMUNITY): Payer: Self-pay

## 2024-07-30 ENCOUNTER — Other Ambulatory Visit (HOSPITAL_COMMUNITY): Payer: Self-pay

## 2024-07-31 ENCOUNTER — Other Ambulatory Visit: Payer: Self-pay | Admitting: Adult Health

## 2024-08-22 ENCOUNTER — Other Ambulatory Visit (HOSPITAL_COMMUNITY): Payer: Self-pay

## 2024-08-23 ENCOUNTER — Encounter: Payer: Self-pay | Admitting: Adult Health

## 2024-08-23 DIAGNOSIS — J020 Streptococcal pharyngitis: Secondary | ICD-10-CM

## 2024-08-23 DIAGNOSIS — Z760 Encounter for issue of repeat prescription: Secondary | ICD-10-CM

## 2024-08-23 DIAGNOSIS — I10 Essential (primary) hypertension: Secondary | ICD-10-CM

## 2024-08-28 MED ORDER — PREDNISONE 10 MG PO TABS: 10.0000 mg | ORAL_TABLET | Freq: Every day | ORAL | 0 refills | Status: AC

## 2024-08-28 MED ORDER — HYDROCHLOROTHIAZIDE 12.5 MG PO CAPS: 12.5000 mg | ORAL_CAPSULE | Freq: Every day | ORAL | 3 refills | Status: AC

## 2024-08-28 MED ORDER — CALCIUM CARBONATE 600 MG PO TABS: 600.0000 mg | ORAL_TABLET | Freq: Two times a day (BID) | ORAL | 3 refills | Status: AC

## 2024-08-28 MED ORDER — OMEPRAZOLE 20 MG PO CPDR: 20.0000 mg | DELAYED_RELEASE_CAPSULE | Freq: Every day | ORAL | 0 refills | Status: AC

## 2025-06-04 ENCOUNTER — Encounter: Admitting: Adult Health
